# Patient Record
Sex: Female | Born: 1945 | Hispanic: No | Marital: Married | Smoking: Never smoker
Health system: Southern US, Community
[De-identification: ages and names within clinical notes are randomized; demographics above are authoritative.]

## PROBLEM LIST (undated history)

## (undated) ENCOUNTER — Ambulatory Visit: Payer: MEDICARE | Attending: Clinical Neuropsychologist | Primary: Clinical Neuropsychologist

## (undated) ENCOUNTER — Encounter: Attending: Neurology | Primary: Neurology

## (undated) ENCOUNTER — Encounter

## (undated) ENCOUNTER — Ambulatory Visit

## (undated) ENCOUNTER — Ambulatory Visit: Payer: MEDICARE

## (undated) ENCOUNTER — Ambulatory Visit: Payer: MEDICARE | Attending: Adult Health | Primary: Adult Health

## (undated) ENCOUNTER — Encounter: Attending: Adult Health | Primary: Adult Health

## (undated) ENCOUNTER — Ambulatory Visit: Attending: Internal Medicine | Primary: Internal Medicine

## (undated) ENCOUNTER — Encounter: Attending: Family Medicine | Primary: Family Medicine

## (undated) ENCOUNTER — Ambulatory Visit
Payer: MEDICARE | Attending: Student in an Organized Health Care Education/Training Program | Primary: Student in an Organized Health Care Education/Training Program

## (undated) ENCOUNTER — Encounter: Attending: Physician Assistant | Primary: Physician Assistant

## (undated) ENCOUNTER — Encounter
Attending: Student in an Organized Health Care Education/Training Program | Primary: Student in an Organized Health Care Education/Training Program

## (undated) DIAGNOSIS — D751 Secondary polycythemia: Secondary | ICD-10-CM

## (undated) DIAGNOSIS — D45 Polycythemia vera: Secondary | ICD-10-CM

## (undated) DIAGNOSIS — D75839 Thrombocytosis, unspecified: Secondary | ICD-10-CM

## (undated) DIAGNOSIS — I8390 Asymptomatic varicose veins of unspecified lower extremity: Secondary | ICD-10-CM

## (undated) DIAGNOSIS — Z23 Encounter for immunization: Secondary | ICD-10-CM

## (undated) DIAGNOSIS — F418 Other specified anxiety disorders: Secondary | ICD-10-CM

## (undated) DIAGNOSIS — R251 Tremor, unspecified: Secondary | ICD-10-CM

## (undated) DIAGNOSIS — K635 Polyp of colon: Secondary | ICD-10-CM

## (undated) DIAGNOSIS — R634 Abnormal weight loss: Secondary | ICD-10-CM

## (undated) DIAGNOSIS — B373 Candidiasis of vulva and vagina: Secondary | ICD-10-CM

## (undated) DIAGNOSIS — I10 Essential (primary) hypertension: Secondary | ICD-10-CM

## (undated) DIAGNOSIS — B3731 Acute candidiasis of vulva and vagina: Secondary | ICD-10-CM

## (undated) DIAGNOSIS — D473 Essential (hemorrhagic) thrombocythemia: Secondary | ICD-10-CM

## (undated) DIAGNOSIS — G4700 Insomnia, unspecified: Secondary | ICD-10-CM

## (undated) HISTORY — DX: Essential (primary) hypertension: I10

## (undated) HISTORY — DX: Encounter for immunization: Z23

## (undated) HISTORY — DX: Acute candidiasis of vulva and vagina: B37.31

## (undated) HISTORY — DX: Thrombocytosis, unspecified: D75.839

## (undated) HISTORY — DX: Asymptomatic varicose veins of unspecified lower extremity: I83.90

## (undated) HISTORY — DX: Essential (hemorrhagic) thrombocythemia: D47.3

## (undated) HISTORY — DX: Other specified anxiety disorders: F41.8

## (undated) HISTORY — DX: Candidiasis of vulva and vagina: B37.3

## (undated) HISTORY — DX: Abnormal weight loss: R63.4

## (undated) HISTORY — DX: Insomnia, unspecified: G47.00

## (undated) HISTORY — DX: Polyp of colon: K63.5

## (undated) HISTORY — DX: Secondary polycythemia: D75.1

## (undated) HISTORY — DX: Tremor, unspecified: R25.1

## (undated) HISTORY — PX: NO PAST SURGERIES: SHX2092

## (undated) HISTORY — DX: Polycythemia vera: D45

---

## 1997-07-23 ENCOUNTER — Other Ambulatory Visit: Admission: RE | Admit: 1997-07-23 | Discharge: 1997-07-23 | Payer: Self-pay | Admitting: Family Medicine

## 1999-05-04 ENCOUNTER — Other Ambulatory Visit: Admission: RE | Admit: 1999-05-04 | Discharge: 1999-05-04 | Payer: Self-pay | Admitting: Family Medicine

## 2003-04-09 ENCOUNTER — Other Ambulatory Visit: Admission: RE | Admit: 2003-04-09 | Discharge: 2003-04-09 | Payer: Self-pay | Admitting: Family Medicine

## 2003-05-25 DIAGNOSIS — K635 Polyp of colon: Secondary | ICD-10-CM

## 2003-05-25 HISTORY — DX: Polyp of colon: K63.5

## 2003-06-02 ENCOUNTER — Ambulatory Visit (HOSPITAL_COMMUNITY): Admission: RE | Admit: 2003-06-02 | Discharge: 2003-06-02 | Payer: Self-pay | Admitting: *Deleted

## 2003-06-02 ENCOUNTER — Encounter (INDEPENDENT_AMBULATORY_CARE_PROVIDER_SITE_OTHER): Payer: Self-pay | Admitting: Specialist

## 2005-01-01 ENCOUNTER — Other Ambulatory Visit: Admission: RE | Admit: 2005-01-01 | Discharge: 2005-01-01 | Payer: Self-pay | Admitting: Family Medicine

## 2009-10-27 ENCOUNTER — Ambulatory Visit: Payer: Self-pay | Admitting: Psychiatry

## 2009-11-22 ENCOUNTER — Emergency Department (HOSPITAL_COMMUNITY): Admission: EM | Admit: 2009-11-22 | Discharge: 2009-11-22 | Payer: Self-pay | Admitting: Emergency Medicine

## 2009-11-22 ENCOUNTER — Ambulatory Visit (HOSPITAL_COMMUNITY)
Admission: RE | Admit: 2009-11-22 | Discharge: 2009-11-22 | Payer: Self-pay | Source: Home / Self Care | Admitting: Psychiatry

## 2009-11-23 ENCOUNTER — Other Ambulatory Visit (HOSPITAL_COMMUNITY): Admission: RE | Admit: 2009-11-23 | Discharge: 2009-11-25 | Payer: Self-pay | Admitting: Psychiatry

## 2009-11-25 ENCOUNTER — Ambulatory Visit (HOSPITAL_COMMUNITY): Payer: Self-pay | Admitting: Psychiatry

## 2009-12-09 ENCOUNTER — Ambulatory Visit (HOSPITAL_COMMUNITY): Payer: Self-pay | Admitting: Psychiatry

## 2009-12-12 ENCOUNTER — Ambulatory Visit (HOSPITAL_COMMUNITY): Payer: Self-pay | Admitting: Psychiatry

## 2010-03-03 ENCOUNTER — Ambulatory Visit (HOSPITAL_COMMUNITY): Payer: Self-pay | Admitting: Psychiatry

## 2010-06-02 ENCOUNTER — Encounter (HOSPITAL_COMMUNITY): Payer: Self-pay | Admitting: Psychiatry

## 2010-06-09 LAB — BASIC METABOLIC PANEL
BUN: 22 mg/dL (ref 6–23)
CO2: 28 mEq/L (ref 19–32)
Calcium: 9.5 mg/dL (ref 8.4–10.5)
Chloride: 102 mEq/L (ref 96–112)
Creatinine, Ser: 0.97 mg/dL (ref 0.4–1.2)
GFR calc Af Amer: >60 mL/min (ref >60)
GFR calc non Af Amer: 58 mL/min — ABNORMAL LOW (ref >60)
Glucose, Bld: 88 mg/dL (ref 70–99)
Potassium: 3.8 mEq/L (ref 3.5–5.1)
Sodium: 137 mEq/L (ref 135–145)

## 2010-06-09 LAB — DIFFERENTIAL
Basophils Absolute: 0 10*3/uL (ref 0.0–0.1)
Basophils Relative: 0 % (ref 0–1)
Eosinophils Absolute: 0.1 10*3/uL (ref 0.0–0.7)
Eosinophils Relative: 1 % (ref 0–5)
Lymphocytes Relative: 20 % (ref 12–46)
Lymphs Abs: 1.7 10*3/uL (ref 0.7–4.0)
Monocytes Absolute: 0.8 10*3/uL (ref 0.1–1.0)
Monocytes Relative: 9 % (ref 3–12)
Neutro Abs: 5.8 10*3/uL (ref 1.7–7.7)
Neutrophils Relative %: 69 % (ref 43–77)

## 2010-06-09 LAB — URINALYSIS, ROUTINE W REFLEX MICROSCOPIC
Glucose, UA: NEGATIVE mg/dL
Hgb urine dipstick: NEGATIVE
Nitrite: NEGATIVE
Protein, ur: NEGATIVE mg/dL
Specific Gravity, Urine: 1.02 (ref 1.005–1.030)
Urobilinogen, UA: 1 mg/dL (ref 0.0–1.0)
pH: 5.5 (ref 5.0–8.0)

## 2010-06-09 LAB — CBC
HCT: 45 % (ref 36.0–46.0)
Hemoglobin: 15.2 g/dL — ABNORMAL HIGH (ref 12.0–15.0)
MCH: 28 pg (ref 26.0–34.0)
MCHC: 33.8 g/dL (ref 30.0–36.0)
MCV: 83 fL (ref 78.0–100.0)
Platelets: 696 10*3/uL — ABNORMAL HIGH (ref 150–400)
RBC: 5.42 MIL/uL — ABNORMAL HIGH (ref 3.87–5.11)
RDW: 16.1 % — ABNORMAL HIGH (ref 11.5–15.5)
WBC: 8.5 10*3/uL (ref 4.0–10.5)

## 2010-06-09 LAB — URINE MICROSCOPIC-ADD ON

## 2010-06-16 ENCOUNTER — Encounter (HOSPITAL_COMMUNITY): Payer: Medicare Other | Admitting: Psychiatry

## 2010-06-16 DIAGNOSIS — F331 Major depressive disorder, recurrent, moderate: Secondary | ICD-10-CM

## 2010-09-15 ENCOUNTER — Encounter (HOSPITAL_COMMUNITY): Payer: Medicare Other | Admitting: Psychiatry

## 2010-10-20 ENCOUNTER — Encounter (HOSPITAL_COMMUNITY): Payer: Medicare Other | Admitting: Psychiatry

## 2010-10-25 ENCOUNTER — Encounter (INDEPENDENT_AMBULATORY_CARE_PROVIDER_SITE_OTHER): Payer: Medicare Other | Admitting: Psychiatry

## 2010-10-25 DIAGNOSIS — F331 Major depressive disorder, recurrent, moderate: Secondary | ICD-10-CM

## 2010-12-06 ENCOUNTER — Encounter (HOSPITAL_COMMUNITY): Payer: Medicare Other | Admitting: Psychiatry

## 2011-06-05 ENCOUNTER — Other Ambulatory Visit (HOSPITAL_COMMUNITY)
Admission: RE | Admit: 2011-06-05 | Discharge: 2011-06-05 | Disposition: A | Discharge status: Home / Self Care | Payer: Medicare Other | Source: Ambulatory Visit | Attending: Obstetrics and Gynecology | Admitting: Obstetrics and Gynecology

## 2011-06-05 ENCOUNTER — Inpatient Hospital Stay (HOSPITAL_COMMUNITY): Admit: 2011-06-05 | Payer: Self-pay

## 2011-06-05 DIAGNOSIS — Z124 Encounter for screening for malignant neoplasm of cervix: Secondary | ICD-10-CM | POA: Insufficient documentation

## 2011-07-16 ENCOUNTER — Telehealth: Payer: Self-pay | Admitting: *Deleted

## 2011-07-16 ENCOUNTER — Other Ambulatory Visit: Payer: Self-pay | Admitting: Oncology

## 2011-07-16 NOTE — Telephone Encounter (Signed)
Called pt's daughter, Synetta Fail,  With pt's date and times of new pt appt on 4/24, including lab and possible phlebotomy.  She verbalized understanding.

## 2011-07-17 ENCOUNTER — Encounter: Payer: Self-pay | Admitting: Oncology

## 2011-07-17 ENCOUNTER — Telehealth: Payer: Self-pay | Admitting: Oncology

## 2011-07-17 DIAGNOSIS — D75839 Thrombocytosis, unspecified: Secondary | ICD-10-CM | POA: Insufficient documentation

## 2011-07-17 DIAGNOSIS — D473 Essential (hemorrhagic) thrombocythemia: Secondary | ICD-10-CM | POA: Insufficient documentation

## 2011-07-17 DIAGNOSIS — D751 Secondary polycythemia: Secondary | ICD-10-CM | POA: Insufficient documentation

## 2011-07-17 DIAGNOSIS — F418 Other specified anxiety disorders: Secondary | ICD-10-CM | POA: Insufficient documentation

## 2011-07-17 NOTE — Telephone Encounter (Signed)
Referred by Dr. Everlene Other Dx- PCV/Thrombocytosis

## 2011-07-17 NOTE — Patient Instructions (Signed)
ISSUES:  Polycythemia (elevated red blood cell/hemoglobin) and thrombocytosis (elevated platelet count)  - Possibilities:  Reactive vs polycythemia vera (PV) and essential thrombocytosis (ET).   Rule out much rarer diagnosis CML.  - Work up:  Blood test to see if you have PV or ET.  - Treatment:  * Start low dose aspirin 80mg  once daily to decrease risk of clot.  * Phlebotomy to bring down Hct to less than 45 to decrease risk of clot.  * If PV and ET is confirm, will start Hydrea to normalize the blood count to decrease risk of clot.

## 2011-07-18 ENCOUNTER — Other Ambulatory Visit: Payer: Medicare Other

## 2011-07-18 ENCOUNTER — Ambulatory Visit: Payer: Medicare Other

## 2011-07-18 ENCOUNTER — Telehealth: Payer: Self-pay | Admitting: Oncology

## 2011-07-18 ENCOUNTER — Other Ambulatory Visit: Payer: Self-pay | Admitting: Oncology

## 2011-07-18 ENCOUNTER — Other Ambulatory Visit: Payer: Medicare Other | Admitting: Lab

## 2011-07-18 ENCOUNTER — Ambulatory Visit (HOSPITAL_BASED_OUTPATIENT_CLINIC_OR_DEPARTMENT_OTHER): Payer: Medicare Other | Admitting: Oncology

## 2011-07-18 ENCOUNTER — Encounter: Payer: Self-pay | Admitting: Oncology

## 2011-07-18 ENCOUNTER — Ambulatory Visit: Payer: Medicare Other | Admitting: Oncology

## 2011-07-18 VITALS — BP 144/95 | HR 79 | Temp 97.1°F | Ht 62.0 in | Wt 119.8 lb

## 2011-07-18 DIAGNOSIS — F418 Other specified anxiety disorders: Secondary | ICD-10-CM

## 2011-07-18 DIAGNOSIS — D75839 Thrombocytosis, unspecified: Secondary | ICD-10-CM

## 2011-07-18 DIAGNOSIS — D473 Essential (hemorrhagic) thrombocythemia: Secondary | ICD-10-CM

## 2011-07-18 DIAGNOSIS — R197 Diarrhea, unspecified: Secondary | ICD-10-CM

## 2011-07-18 DIAGNOSIS — D751 Secondary polycythemia: Secondary | ICD-10-CM

## 2011-07-18 DIAGNOSIS — F341 Dysthymic disorder: Secondary | ICD-10-CM

## 2011-07-18 DIAGNOSIS — D45 Polycythemia vera: Secondary | ICD-10-CM

## 2011-07-18 LAB — CBC WITH DIFFERENTIAL/PLATELET
BASO%: 0.5 % (ref 0.0–2.0)
Basophils Absolute: 0 10*3/uL (ref 0.0–0.1)
EOS%: 1.9 % (ref 0.0–7.0)
Eosinophils Absolute: 0.2 10*3/uL (ref 0.0–0.5)
HCT: 51.7 % — ABNORMAL HIGH (ref 34.8–46.6)
HGB: 16.4 g/dL — ABNORMAL HIGH (ref 11.6–15.9)
LYMPH%: 11.2 % — ABNORMAL LOW (ref 14.0–49.7)
MCH: 25.4 pg (ref 25.1–34.0)
MCHC: 31.7 g/dL (ref 31.5–36.0)
MCV: 80.1 fL (ref 79.5–101.0)
MONO#: 0.7 10*3/uL (ref 0.1–0.9)
MONO%: 7.5 % (ref 0.0–14.0)
NEUT#: 7.5 10*3/uL — ABNORMAL HIGH (ref 1.5–6.5)
NEUT%: 78.9 % — ABNORMAL HIGH (ref 38.4–76.8)
Platelets: 569 10*3/uL — ABNORMAL HIGH (ref 145–400)
RBC: 6.45 10*6/uL — ABNORMAL HIGH (ref 3.70–5.45)
RDW: 16.4 % — ABNORMAL HIGH (ref 11.2–14.5)
WBC: 9.5 10*3/uL (ref 3.9–10.3)
lymph#: 1.1 10*3/uL (ref 0.9–3.3)
nRBC: 0 % (ref 0–0)

## 2011-07-18 NOTE — Progress Notes (Signed)
Patient came in today as a new patient, Patients daughter was assisting her.  patient was a little sad because her husband had just passed away in 2022/07/20 from cancer then to find out that she needed to come here as a patient as well. I went on to explain to the patient about the financial meeting for today. Patient at this time was ok and her daughter stated that if anything changed they would contact me. Patient does have insurance. Also  Patient will be picking up [employer] again they stopped it after Husbands passing.

## 2011-07-18 NOTE — Progress Notes (Signed)
[facility]  Telephone:([phone] [phone]     INITIAL HEMATOLOGY CONSULTATION    Referral MD:    Dr. Tracey Harries, M.D.   Reason for Referral: polycythemia and thrombocytosis.     HPI:  Carolyn Figueroa is a 66 year-old [place] American woman with PMH of HTN, depression/anxiety.  Her husband was my patient for about 2 years until he passes away two months ago.  Since then, she has been quite depressed.  Over the past two months, she has developed refractory vaginal dryness, pruritus, clear discharge without frank hematuria, dysuria, pyuria.  She was given different courses of antibiotics without improvement of her symptoms.  She was referred to Dr. Lindley Magnus from urology thinking that she may have lower urinary tract symptoms.  She had a CT abdomen that was negative.  However, she had abnormal CBC with polycythemia and thrombocytosis.  Upon reviewing her available CBC, these were present as far back as 11/22/2009 when WBC 5.4; Hgb 15.2; Plt 696K.  Seeing this abnormal CBC, she was kindly referred to the [facility] for evaluation.   Carolyn Figueroa presented to the clinic as a patient for the first time today.  She has chronic fatigue ever since her husband was diagnosed with metastatic pancreas cancer two years ago.  Since his death, she has felt more depressed.  She does not feel like doing anything.  She has low appetite and has had further weight loss.  Despite multiple courses of antibiotics, she still has vaginal dryness, pruritus, clear discharge.  She has developed for the past two weeks watery diarrhea about 3-4x/day without melena, hematochezia, abdominal pain, fever.  She denies skin rash, erythromelalgia, palpable node swelling, drenching night sweat, neuropathy, thigh/calf swelling/edema, chest pain, SOB, DOE, lower back pain, bowel/bladder incontinence.     Past Medical History  Diagnosis Date  . Depression with anxiety   . Polycythemia   .  Thrombocytosis   . Vulvar candidiasis   . Insomnia   . Varicose vein   . Colon polyp 05/2003  . HTN (hypertension)   . Vitamin d deficiency   :    History reviewed. No pertinent past surgical history.:   CURRENT MEDS: Current Outpatient Prescriptions  Medication Sig Dispense Refill  . conjugated estrogens (PREMARIN) vaginal cream Place 0.5 g vaginally 3 (three) times a week.      Marland Kitchen lisinopril (PRINIVIL,ZESTRIL) 20 MG tablet Take 20 mg by mouth daily.      Marland Kitchen LORazepam (ATIVAN) 0.5 MG tablet Take 0.5 mg by mouth 2 (two) times daily.          No Known Allergies:  Family History  Problem Relation Age of Onset  . Heart attack Father   . Clotting disorder Father   . Cancer Paternal Uncle     unknown subtype  :  History   Social History  . Marital Status: Married    Spouse Name: N/A    Number of Children: 2  . Years of Education: N/A   Occupational History  .      Housewife   Social History Main Topics  . Smoking status: Never Smoker   . Smokeless tobacco: Never Used  . Alcohol Use: No  . Drug Use: No  . Sexually Active: No   Other Topics Concern  . Not on file   Social History Narrative  . No narrative on file  :  REVIEW OF SYSTEM:  The rest of the 14-point review of sytem was negative.  Exam: ECOG 1  General:  well-nourished woman in no acute distress.  Eyes:  no scleral icterus.  ENT:  There were no oropharyngeal lesions.  Neck was without thyromegaly.  Lymphatics:  Negative cervical, supraclavicular or axillary adenopathy.  Respiratory: lungs were clear bilaterally without wheezing or crackles.  Cardiovascular:  Regular rate and rhythm, S1/S2, without murmur, rub or gallop.  There was no pedal edema.  GI:  abdomen was soft, flat, nontender, nondistended, without organomegaly.  Muscoloskeletal:  no spinal tenderness of palpation of vertebral spine.  Skin exam was without echymosis, petichae. There was no clubbing or cyanosis.  Neuro exam was nonfocal.   Patient was able to get on and off exam table without assistance.  Gait was normal.  Patient was alerted and oriented.  Attention was good.   Language was appropriate.  Mood was depressed.  Speech was not pressured.  Thought content was not tangential.    LABS:  Lab Results  Component Value Date   WBC 9.5 07/18/2011   HGB 16.4* 07/18/2011   HCT 51.7* 07/18/2011   PLT 569* 07/18/2011   GLUCOSE 88 11/22/2009   NA 137 11/22/2009   K 3.8 11/22/2009   CL 102 11/22/2009   CREATININE 0.97 11/22/2009   BUN 22 11/22/2009   CO2 28 11/22/2009    Blood smear review:   I personally reviewed the patient's peripheral blood smear today.  There was isocytosis.  There was no peripheral blast.  There was no schistocytosis, spherocytosis, target cell, rouleaux formation, tear drop cell.  There was no giant platelets or platelet clumps.      ASSESSMENT AND PLAN:   1.  Polycythemia and thrombocytosis  - Possibilities:  Reactive vs polycythemia vera (PV) and essential thrombocytosis (ET) vs early stage myelofibrosis.   Rule out much rarer diagnosis CML.  We discussed the low risk of transformation from PV/ET to AML.  Without splenomegaly, it is less likely to be advanced myelofibrosis.  - Work up: I sent for JAK-2 mutation to rule out for PV or ET.  I also sent for BCR/ABL to rule out CML.  If these tests are negative and she continues to have worsened polycythemia and thrombocytosis, I may consider bone marrow biopsy in the future.  - Treatment:  * Start low dose aspirin 80mg  once daily to decrease risk of clot.  * Phlebotomy to bring down Hct to less than 45 to decrease risk of clot.  She did not want to do this today since she already had 6 vials of blood sample drawn.   * If JAK-2 mutation positive, then she has PV/ET.  However, if JAK-2 mutation is negative, we can rule out PV, but ET is still a possibility (since JAK-2 is only positive in about 50% of ET).   * If she is confirms to have PV or ET, when we will  need to discuss in more detail about the role of Hydrea.  We touched on it briefly today its role to decrease the risk of thrombosis.     2.  Depression:  Normal grieving with husband's recent death.  However, if within the next few weeks and her depression persists, she may benefit from antidepressant.  She did not tolerate Paxil in the past.  She is only taking Ativan which may not be sufficient.   3.  Vaginal dryness, pruritus, clear discharge: unclear etiology; no improvement with antibiotics.  Follow up with Dr. Neva Seat this week for further evaluation.   4.  Diarrhea:  Most likely due to antibiotics.  However, we'll also rule out Cdif.   5.  Follow up:  If negative testing for JAK-2 and BCR/ABL, we'll see her in 3 months along with q2wk CBC and phlebotomy for Hct >45%.    Thank you for this referral.   The length of time of the face-to-face encounter was 45 minutes. More than 50% of time was spent counseling and coordination of care.

## 2011-07-18 NOTE — Telephone Encounter (Signed)
appt made and printed for pt aom °

## 2011-07-19 ENCOUNTER — Other Ambulatory Visit: Payer: Self-pay | Admitting: Oncology

## 2011-07-19 ENCOUNTER — Telehealth: Payer: Self-pay | Admitting: *Deleted

## 2011-07-19 DIAGNOSIS — D751 Secondary polycythemia: Secondary | ICD-10-CM

## 2011-07-19 LAB — COMPREHENSIVE METABOLIC PANEL
ALT: 8 U/L (ref 0–35)
AST: 16 U/L (ref 0–37)
Albumin: 4.1 g/dL (ref 3.5–5.2)
Alkaline Phosphatase: 53 U/L (ref 39–117)
BUN: 22 mg/dL (ref 6–23)
CO2: 28 mEq/L (ref 19–32)
Calcium: 9.1 mg/dL (ref 8.4–10.5)
Chloride: 103 mEq/L (ref 96–112)
Creatinine, Ser: 1.07 mg/dL (ref 0.50–1.10)
Glucose, Bld: 87 mg/dL (ref 70–99)
Potassium: 4.3 mEq/L (ref 3.5–5.3)
Sodium: 141 mEq/L (ref 135–145)
Total Bilirubin: 1.2 mg/dL (ref 0.3–1.2)
Total Protein: 6.5 g/dL (ref 6.0–8.3)

## 2011-07-19 LAB — ERYTHROPOIETIN: Erythropoietin: <1 m[IU]/mL — ABNORMAL LOW (ref 2.6–34.0)

## 2011-07-19 LAB — IRON AND TIBC
%SAT: 11 % — ABNORMAL LOW (ref 20–55)
Iron: 38 ug/dL — ABNORMAL LOW (ref 42–145)
TIBC: 340 ug/dL (ref 250–470)
UIBC: 302 ug/dL (ref 125–400)

## 2011-07-19 LAB — FERRITIN: Ferritin: 17 ng/mL (ref 10–291)

## 2011-07-19 NOTE — Telephone Encounter (Signed)
Call from pt's daughter to report that pt's Uncle had Leukemia, but they do not know which type.   Notified Dr. Gaylyn Rong. Also faxed yesterday's progress note to Dr. Neva Seat, GYN [phone]) for pt's visit this afternoon.

## 2011-07-23 ENCOUNTER — Other Ambulatory Visit: Payer: Medicare Other

## 2011-07-23 ENCOUNTER — Ambulatory Visit: Payer: Medicare Other

## 2011-07-23 ENCOUNTER — Ambulatory Visit: Payer: Medicare Other | Admitting: Oncology

## 2011-07-23 LAB — BCR/ABL (LIO MMD)

## 2011-07-23 LAB — JAK2 GENOTYPR

## 2011-07-25 ENCOUNTER — Other Ambulatory Visit: Payer: Self-pay | Admitting: *Deleted

## 2011-07-27 ENCOUNTER — Telehealth: Payer: Self-pay | Admitting: Oncology

## 2011-07-27 ENCOUNTER — Ambulatory Visit: Payer: Medicare Other

## 2011-07-27 ENCOUNTER — Encounter: Payer: Self-pay | Admitting: *Deleted

## 2011-07-27 ENCOUNTER — Other Ambulatory Visit: Payer: Self-pay | Admitting: Oncology

## 2011-07-27 ENCOUNTER — Telehealth: Payer: Self-pay | Admitting: *Deleted

## 2011-07-27 DIAGNOSIS — D473 Essential (hemorrhagic) thrombocythemia: Secondary | ICD-10-CM

## 2011-07-27 DIAGNOSIS — F418 Other specified anxiety disorders: Secondary | ICD-10-CM

## 2011-07-27 DIAGNOSIS — D751 Secondary polycythemia: Secondary | ICD-10-CM

## 2011-07-27 DIAGNOSIS — D75839 Thrombocytosis, unspecified: Secondary | ICD-10-CM

## 2011-07-27 DIAGNOSIS — R197 Diarrhea, unspecified: Secondary | ICD-10-CM

## 2011-07-27 NOTE — Telephone Encounter (Signed)
called pts dauhgter informed her that we added md appt for 05/08 and to arrive at 2:30pm

## 2011-07-27 NOTE — Telephone Encounter (Signed)
Received verbal notification that pt's stool specimen is positive for C diff.   Notified dr. Gaylyn Rong and he instructs to notify pt's PCP.  I called Dr. Bonney Leitz office and they are closed for lunch at this time. Will call again after lunch.  Called Dr. Bonney Leitz office and spoke w/ Patice. Informed her pt's stool positive for C-diff and she states will inform Dr. Everlene Other.

## 2011-07-27 NOTE — Progress Notes (Signed)
C-diff results faxed to Dr. Bonney Leitz office at [phone].

## 2011-07-30 ENCOUNTER — Other Ambulatory Visit: Payer: Self-pay | Admitting: Oncology

## 2011-07-30 LAB — CLOSTRIDIUM DIFFICILE BY PCR: Toxigenic C. Difficile by PCR: POSITIVE — CR

## 2011-07-31 NOTE — Progress Notes (Signed)
Daughter called to cancel appointment for tomorrow 08/01/11, states that patient has diarrhea and is diagnosed with C-diff, and antibiotics; states that patient has has to continuously go to bathroom and is very weak; will call back to reschedule appointment.

## 2011-08-01 ENCOUNTER — Ambulatory Visit: Payer: Medicare Other | Admitting: Oncology

## 2011-08-01 ENCOUNTER — Other Ambulatory Visit: Payer: Medicare Other | Admitting: Lab

## 2011-08-03 ENCOUNTER — Telehealth: Payer: Self-pay | Admitting: Oncology

## 2011-08-03 NOTE — Telephone Encounter (Signed)
called pts daughter lmovm for appt d/t on 05/22

## 2011-08-08 ENCOUNTER — Telehealth: Payer: Self-pay | Admitting: *Deleted

## 2011-08-08 NOTE — Telephone Encounter (Signed)
Called Synetta Fail back and informed that Dr. Gaylyn Rong said he is going to recommend pt start Hydrea on next office visit.  So pt will likely not have phlebotomy done and IVF cannot be done here based on pt's diagnosis.  Instructed to keep office visit on 5/22 as scheduled and to f/u w/ PCP for question/concern of dehydration,  Decreased appetite and depression.  Synetta Fail verbalized understanding and states pt has appt w/ Dr. Everlene Other tomorrow.  She asks for labs (jak2 and bcr/able reports) to be faxed to Dr. Everlene Other.   Faxed labs to [phone].

## 2011-08-08 NOTE — Telephone Encounter (Signed)
VM left by pt's daughter asking if pt can get Phlebotomy this week along with IVFs?  She feels pt is not eating/drinking well, just recovering from C-diff and asks if Phlebotomy can be done since it was put on hold last week but also worries about pt getting more Dehydrated at same time.

## 2011-08-09 ENCOUNTER — Telehealth: Payer: Self-pay | Admitting: *Deleted

## 2011-08-09 NOTE — Telephone Encounter (Signed)
Dau left VM informing that pt saw her PCP yesterday and was started on Celexa and her dose of Lorazepam was adjusted to one in am and 2 at HS.  She states pt c/o sweating at night, along w/ itching after showering and stiff neck w/ blurred vision. Pt has appt to see Dr. Gaylyn Rong on 5/22, in one week.

## 2011-08-10 ENCOUNTER — Encounter: Payer: Self-pay | Admitting: Dietician

## 2011-08-10 NOTE — Progress Notes (Signed)
Brief Out-patient Oncology Nutrition Note  Reason: Positive Nutrition Risk Screen for unintentional weight loss and decreased appetite.  Carolyn Figueroa is a 66 year old female patient of Dr. Gaylyn Rong, diagnosed with polycythemia and thrombocystosis. Attempted to contact [name] via home telephone number for nutrition risk. Left patient voice mail with RD contact information.  RD available for nutrition needs.  Iven Finn [facility] [phone]

## 2011-08-13 ENCOUNTER — Telehealth: Payer: Self-pay | Admitting: *Deleted

## 2011-08-13 NOTE — Telephone Encounter (Signed)
Call from nurse, Asher Muir, for [facility] Anis PA-C,  Requests labs be added on to labs her on 08/15/11.  She requests Hgb A1C, CMET and Thyroid profile. Orders faxed to [place] and I gave copy to lab tech Chip Boer for lab on 5/22.  Results to be faxed to Elpidio Anis at fax [phone].

## 2011-08-15 ENCOUNTER — Other Ambulatory Visit (HOSPITAL_BASED_OUTPATIENT_CLINIC_OR_DEPARTMENT_OTHER): Payer: Medicare Other | Admitting: Lab

## 2011-08-15 ENCOUNTER — Encounter: Payer: Self-pay | Admitting: Oncology

## 2011-08-15 ENCOUNTER — Telehealth: Payer: Self-pay | Admitting: Oncology

## 2011-08-15 ENCOUNTER — Ambulatory Visit (HOSPITAL_BASED_OUTPATIENT_CLINIC_OR_DEPARTMENT_OTHER): Payer: Medicare Other | Admitting: Oncology

## 2011-08-15 ENCOUNTER — Other Ambulatory Visit: Payer: Medicare Other | Admitting: Lab

## 2011-08-15 VITALS — BP 110/76 | HR 90 | Temp 97.2°F | Ht 62.0 in | Wt 112.7 lb

## 2011-08-15 DIAGNOSIS — D75839 Thrombocytosis, unspecified: Secondary | ICD-10-CM

## 2011-08-15 DIAGNOSIS — D45 Polycythemia vera: Secondary | ICD-10-CM

## 2011-08-15 DIAGNOSIS — F329 Major depressive disorder, single episode, unspecified: Secondary | ICD-10-CM

## 2011-08-15 DIAGNOSIS — D751 Secondary polycythemia: Secondary | ICD-10-CM

## 2011-08-15 DIAGNOSIS — F418 Other specified anxiety disorders: Secondary | ICD-10-CM

## 2011-08-15 DIAGNOSIS — R197 Diarrhea, unspecified: Secondary | ICD-10-CM

## 2011-08-15 DIAGNOSIS — D473 Essential (hemorrhagic) thrombocythemia: Secondary | ICD-10-CM

## 2011-08-15 DIAGNOSIS — F3289 Other specified depressive episodes: Secondary | ICD-10-CM

## 2011-08-15 HISTORY — DX: Polycythemia vera: D45

## 2011-08-15 LAB — CBC WITH DIFFERENTIAL/PLATELET
BASO%: 0.8 % (ref 0.0–2.0)
Basophils Absolute: 0.1 10*3/uL (ref 0.0–0.1)
EOS%: 2 % (ref 0.0–7.0)
Eosinophils Absolute: 0.2 10*3/uL (ref 0.0–0.5)
HCT: 48.1 % — ABNORMAL HIGH (ref 34.8–46.6)
HGB: 15.4 g/dL (ref 11.6–15.9)
LYMPH%: 18.1 % (ref 14.0–49.7)
MCH: 25.4 pg (ref 25.1–34.0)
MCHC: 32.1 g/dL (ref 31.5–36.0)
MCV: 79.1 fL — ABNORMAL LOW (ref 79.5–101.0)
MONO#: 0.7 10*3/uL (ref 0.1–0.9)
MONO%: 8.7 % (ref 0.0–14.0)
NEUT#: 5.4 10*3/uL (ref 1.5–6.5)
NEUT%: 70.4 % (ref 38.4–76.8)
Platelets: 581 10*3/uL — ABNORMAL HIGH (ref 145–400)
RBC: 6.07 10*6/uL — ABNORMAL HIGH (ref 3.70–5.45)
RDW: 17.2 % — ABNORMAL HIGH (ref 11.2–14.5)
WBC: 7.7 10*3/uL (ref 3.9–10.3)
lymph#: 1.4 10*3/uL (ref 0.9–3.3)
nRBC: 0 % (ref 0–0)

## 2011-08-15 MED ORDER — HYDROXYUREA 500 MG PO CAPS: 500.0000 mg | ORAL_CAPSULE | Freq: Every day | ORAL | Status: DC

## 2011-08-15 MED ORDER — HYDROXYUREA 500 MG PO CAPS: 500.0000 mg | ORAL_CAPSULE | Freq: Two times a day (BID) | ORAL | Status: DC

## 2011-08-15 NOTE — Progress Notes (Signed)
[facility]  Telephone:(336) [phone] Fax:(336) [phone]   OFFICE PROGRESS NOTE   Cc:  Aura Dials, MD, MD  DIAGNOSIS:   JAK-2 mutation positive polycythemia vera (PV)  CURRENT THERAPY:  Here to start Hydrea 500mg  PO BID today 08/15/2011.   INTERVAL HISTORY: Carolyn Figueroa 66 y.o. female returns for regular follow up with her daughter to go over the result of work up from last visit.  She was found to have Cdif and was given a course of treatment with complete resolution of diarrhea, dysuria, vaginal dryness and discharge.  She has been very depressed ever since her husband's death from metastatic pancreatic cancer in March 2013.  She was recently started on Celexa last week.  She and her daughter have not noticed much significant improvement of her mood.  She has low appetite and continued to have weight loss.  She has no drive to do anything outside of the house.    Patient denies headache, visual changes, confusion, drenching night sweats, palpable lymph node swelling, mucositis, odynophagia, dysphagia, nausea vomiting, jaundice, chest pain, palpitation, shortness of breath, dyspnea on exertion, productive cough, gum bleeding, epistaxis, hematemesis, hemoptysis, abdominal pain, abdominal swelling, early satiety, melena, hematochezia, hematuria, skin rash, spontaneous bleeding, joint swelling, joint pain, heat or cold intolerance, bowel bladder incontinence, back pain, focal motor weakness, paresthesia.     Past Medical History  Diagnosis Date  . Depression with anxiety   . Polycythemia   . Thrombocytosis   . Vulvar candidiasis   . Insomnia   . Varicose vein   . Colon polyp 05/2003  . HTN (hypertension)   . Vitamin d deficiency   . 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  Current Outpatient Prescriptions  Medication Sig Dispense Refill  . citalopram (CELEXA) 10 MG tablet Take 10 mg by mouth daily.      Marland Kitchen lisinopril (PRINIVIL,ZESTRIL)  20 MG tablet Take 20 mg by mouth daily.      Marland Kitchen LORazepam (ATIVAN) 0.5 MG tablet Take 0.5 mg by mouth 2 (two) times daily.      Marland Kitchen conjugated estrogens (PREMARIN) vaginal cream Place 0.5 g vaginally 3 (three) times a week.      . hydroxyurea (HYDREA) 500 MG capsule Take 1 capsule (500 mg total) by mouth 2 (two) times daily. May take with food to minimize GI side effects.  60 capsule  5    ALLERGIES:   has no known allergies.  REVIEW OF SYSTEMS:  The rest of the 14-point review of system was negative.   Filed Vitals:   08/15/11 1342  BP: 110/76  Pulse: 90  Temp: 97.2 F (36.2 C)   Wt Readings from Last 3 Encounters:  08/15/11 112 lb 11.2 oz (51.12 kg)  07/18/11 119 lb 12.8 oz (54.341 kg)   ECOG Performance status: 1  PHYSICAL EXAMINATION:   General:  Thin-appearing woman, in no acute distress.  Eyes:  no scleral icterus.  ENT:  There were no oropharyngeal lesions.  Neck was without thyromegaly.  Lymphatics:  Negative cervical, supraclavicular or axillary adenopathy.  Respiratory: lungs were clear bilaterally without wheezing or crackles.  Cardiovascular:  Regular rate and rhythm, S1/S2, without murmur, rub or gallop.  There was no pedal edema.  GI:  abdomen was soft, flat, nontender, nondistended, without organomegaly.  Muscoloskeletal:  no spinal tenderness of palpation of vertebral spine.  Skin exam was without echymosis, petichae.  Neuro exam was nonfocal.  Patient was able to  get on and off exam table without assistance.  Gait was normal.  Patient was alerted and oriented.  Attention was good.   Language was appropriate.  Mood was depressed.  Speech was not pressured.  Thought content was not tangential.     LABORATORY/RADIOLOGY DATA:  Lab Results  Component Value Date   WBC 7.7 08/15/2011   HGB 15.4 08/15/2011   HCT 48.1* 08/15/2011   PLT 581* 08/15/2011   GLUCOSE 87 07/18/2011   ALKPHOS 53 07/18/2011   ALT 8 07/18/2011   AST 16 07/18/2011   NA 141 07/18/2011   K 4.3 07/18/2011   CL  103 07/18/2011   CREATININE 1.07 07/18/2011   BUN 22 07/18/2011   CO2 28 07/18/2011     ASSESSMENT AND PLAN:   1. Polycythemia and thrombocytosis:  Due to polycythemia vera (PV) - JAK-2 mutation positive confirming PV.  I explained to her that untreated PV is associated with increased risk of thrombosis.  Rarely, it can transform to myelofibrosis and AML.   - Treatment:  I strongly recommended her to start ASA 81mg  PO daily and Hydrea 500mg  PO BID.  These measures have been proven in randomized trial to decrease the risk of thrombosis.  We will check her lab every 2 weeks for the next 3 months to ensure that she does not develop pancytopenia from Hydrea.  We will see her back in about 3 months.  At that time, if her Hct is still >45%; we may consider therapeutic phlebotomy.   2. Depression: she is going into a major depression.  I agree with Celexa.  I advised patient to consider psychotherapy as she may not have improvement of her depression with just Celexa.  They will think this over and let us know if she needs referral.    3. Vaginal dryness, pruritus, clear discharge: unclear etiology; improved now.   4. Diarrhea: recently treated for Cdif.  It has thus resolved.     The length of time of the face-to-face encounter was 15 minutes. More than 50% of time was spent counseling and coordination of care.

## 2011-08-15 NOTE — Patient Instructions (Addendum)
A.  Diagnosis:  Polycythemia Vera (PV). - Elevated Hgb; risk of blood clots; low risk of transformation to myelofibrosis (scarring of the bone marrow) and acute leukemia.  - Low dose Aspirin 81mg  by mouth once daily to decrease risk of clot. - Hydrea starting out with 500mg  by mouth twice daily to normalize Hgb/Hct.  We need to check your blood count once every 2 weeks for the first few months to see your response.   After a few months of Hydrea, and your Hct is still >45; we may consider phlebotomy.

## 2011-08-15 NOTE — Telephone Encounter (Signed)
Gave pt appt calendar for June , July and August lab, phlebotomy and ML

## 2011-08-29 ENCOUNTER — Ambulatory Visit: Payer: Medicare Other

## 2011-08-29 ENCOUNTER — Other Ambulatory Visit (HOSPITAL_BASED_OUTPATIENT_CLINIC_OR_DEPARTMENT_OTHER): Payer: Medicare Other | Admitting: Lab

## 2011-08-29 ENCOUNTER — Telehealth: Payer: Self-pay | Admitting: *Deleted

## 2011-08-29 DIAGNOSIS — F418 Other specified anxiety disorders: Secondary | ICD-10-CM

## 2011-08-29 DIAGNOSIS — R197 Diarrhea, unspecified: Secondary | ICD-10-CM

## 2011-08-29 DIAGNOSIS — D751 Secondary polycythemia: Secondary | ICD-10-CM

## 2011-08-29 DIAGNOSIS — D75839 Thrombocytosis, unspecified: Secondary | ICD-10-CM

## 2011-08-29 DIAGNOSIS — D473 Essential (hemorrhagic) thrombocythemia: Secondary | ICD-10-CM

## 2011-08-29 DIAGNOSIS — D45 Polycythemia vera: Secondary | ICD-10-CM

## 2011-08-29 LAB — CBC WITH DIFFERENTIAL/PLATELET
BASO%: 0.8 % (ref 0.0–2.0)
Basophils Absolute: 0 10*3/uL (ref 0.0–0.1)
EOS%: 1.6 % (ref 0.0–7.0)
Eosinophils Absolute: 0.1 10*3/uL (ref 0.0–0.5)
HCT: 47.1 % — ABNORMAL HIGH (ref 34.8–46.6)
HGB: 15.2 g/dL (ref 11.6–15.9)
LYMPH%: 30 % (ref 14.0–49.7)
MCH: 26.4 pg (ref 25.1–34.0)
MCHC: 32.2 g/dL (ref 31.5–36.0)
MCV: 82 fL (ref 79.5–101.0)
MONO#: 0.3 10*3/uL (ref 0.1–0.9)
MONO%: 5.7 % (ref 0.0–14.0)
NEUT#: 3.2 10*3/uL (ref 1.5–6.5)
NEUT%: 61.9 % (ref 38.4–76.8)
Platelets: 511 10*3/uL — ABNORMAL HIGH (ref 145–400)
RBC: 5.75 10*6/uL — ABNORMAL HIGH (ref 3.70–5.45)
RDW: 17.5 % — ABNORMAL HIGH (ref 11.2–14.5)
WBC: 5.1 10*3/uL (ref 3.9–10.3)
lymph#: 1.5 10*3/uL (ref 0.9–3.3)
nRBC: 0 % (ref 0–0)

## 2011-08-29 NOTE — Progress Notes (Signed)
1450 Hct @ 47.1. Dr. Gaylyn Rong informed. No phlebotomy today, per Dr. Gaylyn Rong. Patient and daughter informed of lab counts and advised to continue taking Hydrea as directed per Dr. Gaylyn Rong. Patient verbalized understanding.

## 2011-08-29 NOTE — Telephone Encounter (Signed)
VM from pt's daughter asking if pt needs to wait on lab results today as she is also scheduled for possible phlebotomy.  Called her back and instructed to wait for results as Dr. Gaylyn Rong said in his office note pt may need phlebotomy if Hct still >45%.

## 2011-08-29 NOTE — Telephone Encounter (Signed)
Opened in error

## 2011-08-31 ENCOUNTER — Other Ambulatory Visit: Payer: Self-pay | Admitting: Physician Assistant

## 2011-09-04 ENCOUNTER — Other Ambulatory Visit: Payer: Self-pay | Admitting: *Deleted

## 2011-09-05 ENCOUNTER — Other Ambulatory Visit: Payer: Self-pay | Admitting: Physician Assistant

## 2011-09-05 ENCOUNTER — Other Ambulatory Visit: Payer: Self-pay | Admitting: Oncology

## 2011-09-05 DIAGNOSIS — D45 Polycythemia vera: Secondary | ICD-10-CM

## 2011-09-10 ENCOUNTER — Telehealth: Payer: Self-pay | Admitting: *Deleted

## 2011-09-10 NOTE — Telephone Encounter (Signed)
VM from pt's daughter asking if pt is supposed to be scheduled for phlebotomy on the 19th?  Her understanding was lab only.  Per Dr. Gaylyn Rong if pt's Hct is still over 45 pt should have phlebotomy, so we should keep it scheduled.  Dau mentioned a conflict w/ another appt so I called her back and left Vm informing of phlebotomy to stay scheduled if needed and to call back to r/s the day if needed.

## 2011-09-12 ENCOUNTER — Telehealth: Payer: Self-pay | Admitting: *Deleted

## 2011-09-12 ENCOUNTER — Other Ambulatory Visit (HOSPITAL_BASED_OUTPATIENT_CLINIC_OR_DEPARTMENT_OTHER): Payer: Medicare Other | Admitting: Lab

## 2011-09-12 DIAGNOSIS — D751 Secondary polycythemia: Secondary | ICD-10-CM

## 2011-09-12 DIAGNOSIS — F418 Other specified anxiety disorders: Secondary | ICD-10-CM

## 2011-09-12 DIAGNOSIS — R197 Diarrhea, unspecified: Secondary | ICD-10-CM

## 2011-09-12 DIAGNOSIS — D45 Polycythemia vera: Secondary | ICD-10-CM

## 2011-09-12 DIAGNOSIS — D473 Essential (hemorrhagic) thrombocythemia: Secondary | ICD-10-CM

## 2011-09-12 DIAGNOSIS — D75839 Thrombocytosis, unspecified: Secondary | ICD-10-CM

## 2011-09-12 LAB — COMPREHENSIVE METABOLIC PANEL
ALT: 16 U/L (ref 0–35)
AST: 20 U/L (ref 0–37)
Albumin: 3.8 g/dL (ref 3.5–5.2)
Alkaline Phosphatase: 37 U/L — ABNORMAL LOW (ref 39–117)
BUN: 28 mg/dL — ABNORMAL HIGH (ref 6–23)
CO2: 30 mEq/L (ref 19–32)
Calcium: 9.1 mg/dL (ref 8.4–10.5)
Chloride: 99 mEq/L (ref 96–112)
Creatinine, Ser: 1.06 mg/dL (ref 0.50–1.10)
Glucose, Bld: 130 mg/dL — ABNORMAL HIGH (ref 70–99)
Potassium: 3.9 mEq/L (ref 3.5–5.3)
Sodium: 138 mEq/L (ref 135–145)
Total Bilirubin: 1.7 mg/dL — ABNORMAL HIGH (ref 0.3–1.2)
Total Protein: 6.1 g/dL (ref 6.0–8.3)

## 2011-09-12 LAB — CBC WITH DIFFERENTIAL/PLATELET
BASO%: 0.9 % (ref 0.0–2.0)
Basophils Absolute: 0 10*3/uL (ref 0.0–0.1)
EOS%: 3.3 % (ref 0.0–7.0)
Eosinophils Absolute: 0.1 10*3/uL (ref 0.0–0.5)
HCT: 42.7 % (ref 34.8–46.6)
HGB: 14 g/dL (ref 11.6–15.9)
LYMPH%: 46.6 % (ref 14.0–49.7)
MCH: 27.7 pg (ref 25.1–34.0)
MCHC: 32.7 g/dL (ref 31.5–36.0)
MCV: 84.6 fL (ref 79.5–101.0)
MONO#: 0.3 10*3/uL (ref 0.1–0.9)
MONO%: 8.8 % (ref 0.0–14.0)
NEUT#: 1.2 10*3/uL — ABNORMAL LOW (ref 1.5–6.5)
NEUT%: 40.4 % (ref 38.4–76.8)
Platelets: 218 10*3/uL (ref 145–400)
RBC: 5.05 10*6/uL (ref 3.70–5.45)
RDW: 27.4 % — ABNORMAL HIGH (ref 11.2–14.5)
WBC: 3.1 10*3/uL — ABNORMAL LOW (ref 3.9–10.3)
lymph#: 1.4 10*3/uL (ref 0.9–3.3)

## 2011-09-12 MED ORDER — HYDROXYUREA 500 MG PO CAPS: ORAL_CAPSULE | ORAL | Status: DC

## 2011-09-12 NOTE — Telephone Encounter (Signed)
Spoke w/ pt and daughter in the lobby today.  Canceled phlebotomy per Dr. Gaylyn Rong.  Gave written instructions along w/ copy of labs to dau, Synetta Fail.  Instructed after clarifying w/ Dr. Gaylyn Rong for pt to take Hydrea twice daily on Mondays, Wednesdays and Fridays,  Then once daily on Tuesdays, Thursdays, Saturdays and Sundays.   Keep next lab only appt in 2 weeks.  Explained WBC a little low d/t Hydrea, but ok.  Synetta Fail verbalized understanding.  Synetta Fail called later in day to ask if low WBC will affect pt's healing from the cataract surgery.  Pt's next cataract surgery is on 7/02.  Informed Synetta Fail pt may be a slightly higher risk for infection, but should improve w/ lowering the dose of Hydrea.  Plan to recheck CBC prior to next surgery.  Order to scheduling to r/s lab from 7/03 to 7/01.   Faxed CBC results to pt's eye surgeon, Dr. Elmer Picker, at fax# [phone] per Anita's request.

## 2011-09-12 NOTE — Telephone Encounter (Signed)
Message copied by Wende Mott on Wed Sep 12, 2011  3:54 PM ------      Message from: HA, Raliegh Ip T      Created: Wed Sep 12, 2011  2:39 PM       Please call her daughter.  Hydrea is working.  Her plt is now normal as is her Hct.  She does not need phlebotomy.  Please cancel all pending phlebotomy appointments.  Her WBC is slightly lower due to Hydrea.  I recommend changing Hydrea to 500mg  PO ONCE daily on ODD days but 500mg  PO BID on even day and weekend.   Thanks.

## 2011-09-19 ENCOUNTER — Telehealth: Payer: Self-pay | Admitting: Oncology

## 2011-09-19 NOTE — Telephone Encounter (Signed)
called pts daughter lmovm to rtn call to r/s appt on 07/01

## 2011-09-24 ENCOUNTER — Other Ambulatory Visit: Payer: Medicare Other | Admitting: Lab

## 2011-09-24 DIAGNOSIS — D473 Essential (hemorrhagic) thrombocythemia: Secondary | ICD-10-CM

## 2011-09-24 DIAGNOSIS — D75839 Thrombocytosis, unspecified: Secondary | ICD-10-CM

## 2011-09-24 DIAGNOSIS — R197 Diarrhea, unspecified: Secondary | ICD-10-CM

## 2011-09-24 DIAGNOSIS — F418 Other specified anxiety disorders: Secondary | ICD-10-CM

## 2011-09-24 DIAGNOSIS — D751 Secondary polycythemia: Secondary | ICD-10-CM

## 2011-09-24 LAB — CBC WITH DIFFERENTIAL/PLATELET
BASO%: 0.6 % (ref 0.0–2.0)
Basophils Absolute: 0 10*3/uL (ref 0.0–0.1)
EOS%: 2 % (ref 0.0–7.0)
Eosinophils Absolute: 0.1 10*3/uL (ref 0.0–0.5)
HCT: 40 % (ref 34.8–46.6)
HGB: 12.9 g/dL (ref 11.6–15.9)
LYMPH%: 48.6 % (ref 14.0–49.7)
MCH: 28.5 pg (ref 25.1–34.0)
MCHC: 32.3 g/dL (ref 31.5–36.0)
MCV: 88.2 fL (ref 79.5–101.0)
MONO#: 0.4 10*3/uL (ref 0.1–0.9)
MONO%: 10.1 % (ref 0.0–14.0)
NEUT#: 1.5 10*3/uL (ref 1.5–6.5)
NEUT%: 38.7 % (ref 38.4–76.8)
Platelets: 231 10*3/uL (ref 145–400)
RBC: 4.53 10*6/uL (ref 3.70–5.45)
RDW: 29 % — ABNORMAL HIGH (ref 11.2–14.5)
WBC: 3.9 10*3/uL (ref 3.9–10.3)
lymph#: 1.9 10*3/uL (ref 0.9–3.3)

## 2011-09-26 ENCOUNTER — Other Ambulatory Visit: Payer: Medicare Other | Admitting: Lab

## 2011-10-10 ENCOUNTER — Other Ambulatory Visit (HOSPITAL_BASED_OUTPATIENT_CLINIC_OR_DEPARTMENT_OTHER): Payer: Medicare Other | Admitting: Lab

## 2011-10-10 DIAGNOSIS — F418 Other specified anxiety disorders: Secondary | ICD-10-CM

## 2011-10-10 DIAGNOSIS — D75839 Thrombocytosis, unspecified: Secondary | ICD-10-CM

## 2011-10-10 DIAGNOSIS — D751 Secondary polycythemia: Secondary | ICD-10-CM

## 2011-10-10 DIAGNOSIS — R197 Diarrhea, unspecified: Secondary | ICD-10-CM

## 2011-10-10 DIAGNOSIS — D45 Polycythemia vera: Secondary | ICD-10-CM

## 2011-10-10 DIAGNOSIS — D473 Essential (hemorrhagic) thrombocythemia: Secondary | ICD-10-CM

## 2011-10-10 LAB — CBC WITH DIFFERENTIAL/PLATELET
BASO%: 0.3 % (ref 0.0–2.0)
Basophils Absolute: 0 10*3/uL (ref 0.0–0.1)
EOS%: 0.7 % (ref 0.0–7.0)
Eosinophils Absolute: 0 10*3/uL (ref 0.0–0.5)
HCT: 37.8 % (ref 34.8–46.6)
HGB: 12.2 g/dL (ref 11.6–15.9)
LYMPH%: 44.8 % (ref 14.0–49.7)
MCH: 29.3 pg (ref 25.1–34.0)
MCHC: 32.3 g/dL (ref 31.5–36.0)
MCV: 90.8 fL (ref 79.5–101.0)
MONO#: 0.3 10*3/uL (ref 0.1–0.9)
MONO%: 7.2 % (ref 0.0–14.0)
NEUT#: 1.9 10*3/uL (ref 1.5–6.5)
NEUT%: 47 % (ref 38.4–76.8)
Platelets: 194 10*3/uL (ref 145–400)
RBC: 4.17 10*6/uL (ref 3.70–5.45)
RDW: 29.8 % — ABNORMAL HIGH (ref 11.2–14.5)
WBC: 4 10*3/uL (ref 3.9–10.3)
lymph#: 1.8 10*3/uL (ref 0.9–3.3)

## 2011-10-10 NOTE — Progress Notes (Signed)
Met with patient and daughter in lobby to give lab results; patient states that she is having hair loss, dark nails and itching; patient to decrease Hydrea to 500mg  once daily, and avoid direct sunlight, per Dr. Gaylyn Rong.

## 2011-10-16 ENCOUNTER — Telehealth: Payer: Self-pay | Admitting: *Deleted

## 2011-10-16 ENCOUNTER — Other Ambulatory Visit: Payer: Medicare Other

## 2011-10-16 ENCOUNTER — Ambulatory Visit: Payer: Medicare Other | Admitting: Oncology

## 2011-10-16 DIAGNOSIS — D45 Polycythemia vera: Secondary | ICD-10-CM

## 2011-10-16 MED ORDER — CENTRUM PO CHEW: 1.0000 | CHEWABLE_TABLET | Freq: Every day | ORAL | Status: DC

## 2011-10-16 NOTE — Addendum Note (Signed)
Addended by: Augusto Garbe on: 10/16/2011 09:48 AM   Modules accepted: Orders

## 2011-10-16 NOTE — Telephone Encounter (Signed)
Message on voicemail from pt.'s daughter, Synetta Fail asking if her mom may resume her Centrum MVI while taking Hydrea.  Consulted with [facility] pharmacist notifying her of pt.'s diagnosis.  No interaction with MVI and hydrea per pharmacist.  Patient's daughter given this information.

## 2011-10-24 ENCOUNTER — Telehealth: Payer: Self-pay | Admitting: *Deleted

## 2011-10-24 ENCOUNTER — Other Ambulatory Visit (HOSPITAL_BASED_OUTPATIENT_CLINIC_OR_DEPARTMENT_OTHER): Payer: Medicare Other | Admitting: Lab

## 2011-10-24 DIAGNOSIS — D473 Essential (hemorrhagic) thrombocythemia: Secondary | ICD-10-CM

## 2011-10-24 DIAGNOSIS — D751 Secondary polycythemia: Secondary | ICD-10-CM

## 2011-10-24 DIAGNOSIS — F418 Other specified anxiety disorders: Secondary | ICD-10-CM

## 2011-10-24 DIAGNOSIS — R197 Diarrhea, unspecified: Secondary | ICD-10-CM

## 2011-10-24 DIAGNOSIS — D45 Polycythemia vera: Secondary | ICD-10-CM

## 2011-10-24 DIAGNOSIS — D75839 Thrombocytosis, unspecified: Secondary | ICD-10-CM

## 2011-10-24 LAB — COMPREHENSIVE METABOLIC PANEL
ALT: 8 U/L (ref 0–35)
AST: 17 U/L (ref 0–37)
Albumin: 4 g/dL (ref 3.5–5.2)
Alkaline Phosphatase: 43 U/L (ref 39–117)
BUN: 26 mg/dL — ABNORMAL HIGH (ref 6–23)
CO2: 30 mEq/L (ref 19–32)
Calcium: 9.6 mg/dL (ref 8.4–10.5)
Chloride: 96 mEq/L (ref 96–112)
Creatinine, Ser: 1.05 mg/dL (ref 0.50–1.10)
Glucose, Bld: 86 mg/dL (ref 70–99)
Potassium: 4.3 mEq/L (ref 3.5–5.3)
Sodium: 134 mEq/L — ABNORMAL LOW (ref 135–145)
Total Bilirubin: 1.3 mg/dL — ABNORMAL HIGH (ref 0.3–1.2)
Total Protein: 6.7 g/dL (ref 6.0–8.3)

## 2011-10-24 LAB — CBC WITH DIFFERENTIAL/PLATELET
BASO%: 0.3 % (ref 0.0–2.0)
Basophils Absolute: 0 10*3/uL (ref 0.0–0.1)
EOS%: 0.9 % (ref 0.0–7.0)
Eosinophils Absolute: 0 10*3/uL (ref 0.0–0.5)
HCT: 35.8 % (ref 34.8–46.6)
HGB: 11.8 g/dL (ref 11.6–15.9)
LYMPH%: 38.1 % (ref 14.0–49.7)
MCH: 30.6 pg (ref 25.1–34.0)
MCHC: 33 g/dL (ref 31.5–36.0)
MCV: 92.9 fL (ref 79.5–101.0)
MONO#: 0.4 10*3/uL (ref 0.1–0.9)
MONO%: 9.2 % (ref 0.0–14.0)
NEUT#: 2 10*3/uL (ref 1.5–6.5)
NEUT%: 51.5 % (ref 38.4–76.8)
Platelets: 194 10*3/uL (ref 145–400)
RBC: 3.85 10*6/uL (ref 3.70–5.45)
RDW: 29.1 % — ABNORMAL HIGH (ref 11.2–14.5)
WBC: 3.8 10*3/uL — ABNORMAL LOW (ref 3.9–10.3)
lymph#: 1.4 10*3/uL (ref 0.9–3.3)

## 2011-10-24 NOTE — Telephone Encounter (Signed)
Called daughter and left VM informing of CBC,  Platelet, results.  Instructed for pt to continue Hydrea, same dose, no change and keep next appt as scheduled on 11/14/11.  Instructed her to call back if any questions or wants more detailed information.

## 2011-10-24 NOTE — Telephone Encounter (Signed)
Message copied by Wende Mott on Wed Oct 24, 2011  4:57 PM ------      Message from: HA, Raliegh Ip T      Created: Wed Oct 24, 2011  3:25 PM       Please call Synetta Fail.  Her CBC is relatively stable.  Continue Hydrea.  No change in dose.   Thanks.

## 2011-10-26 ENCOUNTER — Telehealth: Payer: Self-pay | Admitting: *Deleted

## 2011-10-26 NOTE — Telephone Encounter (Signed)
Daughter left VM asking if ok for pt to receive Shingles Vaccine while on Hydrea?  She also requests office notes/labs be faxed to Elpidio Anis at [facility].  Faxed office visit and labs to her at fax# [phone].

## 2011-10-26 NOTE — Telephone Encounter (Signed)
No problem with shingle vaccination.

## 2011-10-26 NOTE — Telephone Encounter (Signed)
Called Synetta Fail back and informed of ok per Dr. Gaylyn Rong for pt to get Shingles Vaccine while on Hydrea.  She verbalized understanding.

## 2011-11-02 ENCOUNTER — Other Ambulatory Visit: Payer: Self-pay | Admitting: Oncology

## 2011-11-02 ENCOUNTER — Telehealth: Payer: Self-pay | Admitting: *Deleted

## 2011-11-02 DIAGNOSIS — D45 Polycythemia vera: Secondary | ICD-10-CM

## 2011-11-02 MED ORDER — HYDROXYUREA 300 MG PO CAPS: 300.0000 mg | ORAL_CAPSULE | Freq: Every day | ORAL | Status: AC

## 2011-11-02 NOTE — Telephone Encounter (Signed)
Daughter left VM last night stating pt continues to suffer from side effects of Hydrea,  Such as "very very tired,  Hair loss and nail bed color changes."  Says the fatigue and hair loss are the most disturbing to pt..  Asks if dose can be decreased?   Pt has been taking once daily 500 mg since 10/10/11.   Per Dr. Gaylyn Rong pt can decrease dose to 300 mg daily.  He sent new rx to [facility].   Informed daughter of new dose and to call if pharmacy has any problems getting this dose as it is uncommon.  Keep next appt as scheduled on 8/21.  She verbalized understanding.

## 2011-11-14 ENCOUNTER — Ambulatory Visit (HOSPITAL_BASED_OUTPATIENT_CLINIC_OR_DEPARTMENT_OTHER): Payer: Medicare Other | Admitting: Oncology

## 2011-11-14 ENCOUNTER — Other Ambulatory Visit (HOSPITAL_BASED_OUTPATIENT_CLINIC_OR_DEPARTMENT_OTHER): Payer: Medicare Other | Admitting: Lab

## 2011-11-14 ENCOUNTER — Other Ambulatory Visit: Payer: Medicare Other | Admitting: Lab

## 2011-11-14 ENCOUNTER — Telehealth: Payer: Self-pay | Admitting: Oncology

## 2011-11-14 VITALS — BP 109/72 | HR 81 | Temp 97.1°F | Resp 18 | Ht 62.0 in | Wt 118.0 lb

## 2011-11-14 DIAGNOSIS — L659 Nonscarring hair loss, unspecified: Secondary | ICD-10-CM

## 2011-11-14 DIAGNOSIS — R5383 Other fatigue: Secondary | ICD-10-CM

## 2011-11-14 DIAGNOSIS — D75839 Thrombocytosis, unspecified: Secondary | ICD-10-CM

## 2011-11-14 DIAGNOSIS — F3289 Other specified depressive episodes: Secondary | ICD-10-CM

## 2011-11-14 DIAGNOSIS — D473 Essential (hemorrhagic) thrombocythemia: Secondary | ICD-10-CM

## 2011-11-14 DIAGNOSIS — D45 Polycythemia vera: Secondary | ICD-10-CM

## 2011-11-14 DIAGNOSIS — F329 Major depressive disorder, single episode, unspecified: Secondary | ICD-10-CM

## 2011-11-14 LAB — COMPREHENSIVE METABOLIC PANEL
ALT: 9 U/L (ref 0–35)
AST: 15 U/L (ref 0–37)
Albumin: 4 g/dL (ref 3.5–5.2)
Alkaline Phosphatase: 37 U/L — ABNORMAL LOW (ref 39–117)
BUN: 32 mg/dL — ABNORMAL HIGH (ref 6–23)
CO2: 30 mEq/L (ref 19–32)
Calcium: 9.1 mg/dL (ref 8.4–10.5)
Chloride: 99 mEq/L (ref 96–112)
Creatinine, Ser: 1.17 mg/dL — ABNORMAL HIGH (ref 0.50–1.10)
Glucose, Bld: 153 mg/dL — ABNORMAL HIGH (ref 70–99)
Potassium: 3.9 mEq/L (ref 3.5–5.3)
Sodium: 137 mEq/L (ref 135–145)
Total Bilirubin: 1 mg/dL (ref 0.3–1.2)
Total Protein: 6.6 g/dL (ref 6.0–8.3)

## 2011-11-14 LAB — CBC WITH DIFFERENTIAL/PLATELET
BASO%: 0.2 % (ref 0.0–2.0)
Basophils Absolute: 0 10*3/uL (ref 0.0–0.1)
EOS%: 1.1 % (ref 0.0–7.0)
Eosinophils Absolute: 0.1 10*3/uL (ref 0.0–0.5)
HCT: 35.7 % (ref 34.8–46.6)
HGB: 11.7 g/dL (ref 11.6–15.9)
LYMPH%: 35.5 % (ref 14.0–49.7)
MCH: 30.9 pg (ref 25.1–34.0)
MCHC: 32.8 g/dL (ref 31.5–36.0)
MCV: 94.2 fL (ref 79.5–101.0)
MONO#: 0.5 10*3/uL (ref 0.1–0.9)
MONO%: 9.6 % (ref 0.0–14.0)
NEUT#: 3 10*3/uL (ref 1.5–6.5)
NEUT%: 53.6 % (ref 38.4–76.8)
Platelets: 225 10*3/uL (ref 145–400)
RBC: 3.79 10*6/uL (ref 3.70–5.45)
RDW: 20.7 % — ABNORMAL HIGH (ref 11.2–14.5)
WBC: 5.5 10*3/uL (ref 3.9–10.3)
lymph#: 2 10*3/uL (ref 0.9–3.3)

## 2011-11-14 LAB — TSH: TSH: 1.612 u[IU]/mL (ref 0.350–4.500)

## 2011-11-14 NOTE — Progress Notes (Signed)
[facility]  Telephone:([phone] Fax:([phone]   OFFICE PROGRESS NOTE   Cc:  BOUSKA,DAVID E, MD  DIAGNOSIS:   JAK-2 mutation positive polycythemia vera (PV)  CURRENT THERAPY:  Hydrea 300mg  PO daily. Hydrea was started in May 2013.   INTERVAL HISTORY: Carolyn Figueroa 66 y.o. female returns for regular follow up with her daughter for a routine visit. She was started on Hydrea in May initially at 500 mg BID. Dose has been decreased due to mild leukopenia and side effects and she has been on Hydrea 300 mg daily for about 2 weeks now. She is still fatigued. Hair is falling out still and she has not noticed any improvement with decrease in her dose. She remains depressed ever since her husband's death from metastatic pancreatic cancer in March 2013.  She remains on Celexa last week. She has a decreased appetite, but weight is up by 6 lbs.  She has no drive to do anything outside of the house.    Patient denies headache, visual changes, confusion, drenching night sweats, palpable lymph node swelling, mucositis, odynophagia, dysphagia, nausea vomiting, jaundice, chest pain, palpitation, shortness of breath, dyspnea on exertion, productive cough, gum bleeding, epistaxis, hematemesis, hemoptysis, abdominal pain, abdominal swelling, early satiety, melena, hematochezia, hematuria, skin rash, spontaneous bleeding, joint swelling, joint pain, heat or cold intolerance, bowel bladder incontinence, back pain, focal motor weakness, paresthesia.     Past Medical History  Diagnosis Date  . Depression with anxiety   . Polycythemia   . Thrombocytosis   . Vulvar candidiasis   . Insomnia   . Varicose vein   . Colon polyp 05/2003  . HTN (hypertension)   . Vitamin d deficiency   . 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  Current Outpatient Prescriptions  Medication Sig Dispense Refill  . citalopram (CELEXA) 10 MG tablet Take 10 mg by mouth daily.      Marland Kitchen  conjugated estrogens (PREMARIN) vaginal cream Place 0.5 g vaginally 3 (three) times a week.      . hydroxyurea (DROXIA) 300 MG capsule Take 1 capsule (300 mg total) by mouth daily. May take with food to minimize GI side effects.  30 capsule  3  . lisinopril (PRINIVIL,ZESTRIL) 20 MG tablet Take 20 mg by mouth daily.      Marland Kitchen LORazepam (ATIVAN) 0.5 MG tablet Take 0.5 mg by mouth 2 (two) times daily.      . multivitamin-iron-minerals-folic acid (CENTRUM) chewable tablet Chew 1 tablet by mouth daily.  30 tablet  0    ALLERGIES:   has no known allergies.  REVIEW OF SYSTEMS:  The rest of the 14-point review of system was negative.   Filed Vitals:   11/14/11 1444  BP: 109/72  Pulse: 81  Temp: 97.1 F (36.2 C)  Resp: 18   Wt Readings from Last 3 Encounters:  11/14/11 118 lb (53.524 kg)  08/15/11 112 lb 11.2 oz (51.12 kg)  07/18/11 119 lb 12.8 oz (54.341 kg)   ECOG Performance status: 1  PHYSICAL EXAMINATION:   General:  Thin-appearing woman, in no acute distress.  Eyes:  no scleral icterus.  ENT:  There were no oropharyngeal lesions.  Neck was without thyromegaly.  Lymphatics:  Negative cervical, supraclavicular or axillary adenopathy.  Respiratory: lungs were clear bilaterally without wheezing or crackles.  Cardiovascular:  Regular rate and rhythm, S1/S2, without murmur, rub or gallop.  There was no pedal edema.  GI:  abdomen was soft,  flat, nontender, nondistended, without organomegaly.  Muscoloskeletal:  no spinal tenderness of palpation of vertebral spine.  Skin exam was without echymosis, petichae.  Neuro exam was nonfocal.  Patient was able to get on and off exam table without assistance.  Gait was normal.  Patient was alerted and oriented.  Attention was good.   Language was appropriate.  Mood was depressed.  Speech was not pressured.  Thought content was not tangential.     LABORATORY/RADIOLOGY DATA:  Lab Results  Component Value Date   WBC 5.5 11/14/2011   HGB 11.7 11/14/2011   HCT  35.7 11/14/2011   PLT 225 11/14/2011   GLUCOSE 86 10/24/2011   ALKPHOS 43 10/24/2011   ALT 8 10/24/2011   AST 17 10/24/2011   NA 134* 10/24/2011   K 4.3 10/24/2011   CL 96 10/24/2011   CREATININE 1.05 10/24/2011   BUN 26* 10/24/2011   CO2 30 10/24/2011     ASSESSMENT AND PLAN:   1. Polycythemia and thrombocytosis:  Due to polycythemia vera (PV) - JAK-2 mutation positive confirming PV.  I explained to her that untreated PV is associated with increased risk of thrombosis.  Rarely, it can transform to myelofibrosis and AML.   - Treatment: She is on ASA 81mg  PO daily and will plan to continue this. Hydrea is currently 300mg  PO daily These measures have been proven in randomized trial to decrease the risk of thrombosis.  Plan is to continue Hydrea 300 mg daily. We will check her lab every 2 weeks. Will plan to reduce Hydrea dose as low as possible to maintain a normal platelet count. May consider changing Hydrea to 300 mg every other day pending labs.   2. Depression: she is going into a major depression.  I agree with Celexa.  I advised patient to consider psychotherapy as she may not have improvement of her depression with just Celexa.  They will think this over and let us know if she needs referral.    3. Fatigue/hair loss: Could be from [facility], but will also check her TSH today.  4. Follow-up: In about 3 months.    The length of time of the face-to-face encounter was 15 minutes. More than 50% of time was spent counseling and coordination of care.

## 2011-11-14 NOTE — Telephone Encounter (Signed)
Gave pt appt for lab, and MD for September and  October 2013

## 2011-11-14 NOTE — Patient Instructions (Signed)
Your blood counts look good today. Continue Hydrea 300 mg daily. Will plan to check labs every 2 weeks and will try to reduce Hydrea further if your counts are okay.  You will have a follow-up visit in about 3 months.

## 2011-11-15 ENCOUNTER — Telehealth: Payer: Self-pay | Admitting: *Deleted

## 2011-11-15 ENCOUNTER — Ambulatory Visit: Payer: Medicare Other | Admitting: Oncology

## 2011-11-15 ENCOUNTER — Other Ambulatory Visit: Payer: Medicare Other | Admitting: Lab

## 2011-11-15 NOTE — Telephone Encounter (Signed)
Called dau w/ results of TSH wnl and informed of results of CMP,  BUN/Creat slightly elevated.  Instructed to make sure pt staying hydrated and to f/u w/ pt's PCP.  I faxed lab results to Dr. Everlene Other at fax [phone].

## 2011-11-15 NOTE — Telephone Encounter (Signed)
Message copied by Wende Mott on Thu Nov 15, 2011  3:31 PM ------      Message from: Clenton Pare R      Created: Thu Nov 15, 2011  8:38 AM       Please call patient/daughter and let her know that TSH is normal.            Thanks      KC

## 2011-11-28 ENCOUNTER — Other Ambulatory Visit (HOSPITAL_BASED_OUTPATIENT_CLINIC_OR_DEPARTMENT_OTHER): Payer: Medicare Other | Admitting: Lab

## 2011-11-28 DIAGNOSIS — D473 Essential (hemorrhagic) thrombocythemia: Secondary | ICD-10-CM

## 2011-11-28 DIAGNOSIS — F341 Dysthymic disorder: Secondary | ICD-10-CM

## 2011-11-28 DIAGNOSIS — R197 Diarrhea, unspecified: Secondary | ICD-10-CM

## 2011-11-28 DIAGNOSIS — D45 Polycythemia vera: Secondary | ICD-10-CM

## 2011-11-28 LAB — CBC WITH DIFFERENTIAL/PLATELET
BASO%: 0.2 % (ref 0.0–2.0)
Basophils Absolute: 0 10*3/uL (ref 0.0–0.1)
EOS%: 1.1 % (ref 0.0–7.0)
Eosinophils Absolute: 0.1 10*3/uL (ref 0.0–0.5)
HCT: 33.8 % — ABNORMAL LOW (ref 34.8–46.6)
HGB: 11.1 g/dL — ABNORMAL LOW (ref 11.6–15.9)
LYMPH%: 36.6 % (ref 14.0–49.7)
MCH: 31.8 pg (ref 25.1–34.0)
MCHC: 32.8 g/dL (ref 31.5–36.0)
MCV: 96.8 fL (ref 79.5–101.0)
MONO#: 0.5 10*3/uL (ref 0.1–0.9)
MONO%: 9.7 % (ref 0.0–14.0)
NEUT#: 2.9 10*3/uL (ref 1.5–6.5)
NEUT%: 52.4 % (ref 38.4–76.8)
Platelets: 239 10*3/uL (ref 145–400)
RBC: 3.49 10*6/uL — ABNORMAL LOW (ref 3.70–5.45)
RDW: 16 % — ABNORMAL HIGH (ref 11.2–14.5)
WBC: 5.6 10*3/uL (ref 3.9–10.3)
lymph#: 2 10*3/uL (ref 0.9–3.3)

## 2011-11-29 ENCOUNTER — Other Ambulatory Visit: Payer: Self-pay

## 2011-11-29 DIAGNOSIS — D45 Polycythemia vera: Secondary | ICD-10-CM

## 2011-12-06 ENCOUNTER — Telehealth: Payer: Self-pay | Admitting: *Deleted

## 2011-12-06 MED ORDER — HYDROXYUREA 300 MG PO CAPS: 300.0000 mg | ORAL_CAPSULE | ORAL | Status: DC

## 2011-12-06 NOTE — Telephone Encounter (Signed)
Daughter called to report they picked up Hydrea from pharmacy but it was 500 mg not 300 mg.  She called to clarify,  Thought pt is to continue on the 300 mg every other day (not 500mg ).  Confirmed w/ Synetta Fail the correct dose is 300 mg every other day and Natalie's notes said she called in 300 mg capsules.  I called in 300 mg capsules, spoke directly w/ pharmacist at [facility], today and also sent electronically.  Informed Synetta Fail and to call us back if any further problems. She verbalized understanding.

## 2011-12-12 ENCOUNTER — Other Ambulatory Visit (HOSPITAL_BASED_OUTPATIENT_CLINIC_OR_DEPARTMENT_OTHER): Payer: Medicare Other | Admitting: Lab

## 2011-12-12 ENCOUNTER — Telehealth: Payer: Self-pay | Admitting: *Deleted

## 2011-12-12 DIAGNOSIS — D45 Polycythemia vera: Secondary | ICD-10-CM

## 2011-12-12 LAB — CBC WITH DIFFERENTIAL/PLATELET
BASO%: 0.6 % (ref 0.0–2.0)
Basophils Absolute: 0 10*3/uL (ref 0.0–0.1)
EOS%: 1.5 % (ref 0.0–7.0)
Eosinophils Absolute: 0.1 10*3/uL (ref 0.0–0.5)
HCT: 35.7 % (ref 34.8–46.6)
HGB: 12 g/dL (ref 11.6–15.9)
LYMPH%: 34.1 % (ref 14.0–49.7)
MCH: 33.6 pg (ref 25.1–34.0)
MCHC: 33.6 g/dL (ref 31.5–36.0)
MCV: 100.1 fL (ref 79.5–101.0)
MONO#: 0.5 10*3/uL (ref 0.1–0.9)
MONO%: 9.4 % (ref 0.0–14.0)
NEUT#: 3 10*3/uL (ref 1.5–6.5)
NEUT%: 54.4 % (ref 38.4–76.8)
Platelets: 324 10*3/uL (ref 145–400)
RBC: 3.56 10*6/uL — ABNORMAL LOW (ref 3.70–5.45)
RDW: 13.1 % (ref 11.2–14.5)
WBC: 5.5 10*3/uL (ref 3.9–10.3)
lymph#: 1.9 10*3/uL (ref 0.9–3.3)

## 2011-12-12 NOTE — Telephone Encounter (Signed)
Message copied by Wende Mott on Wed Dec 12, 2011  3:57 PM ------      Message from: HA, Raliegh Ip T      Created: Wed Dec 12, 2011  3:09 PM       Please advise her to continue Hydrea 300mg  PO every other day.  Thanks. She should consult with her PCP about antidepressant or even at a referral to a psychiatrist or therapist.

## 2011-12-12 NOTE — Telephone Encounter (Signed)
Daughter left VM earlier today states she thinks pt's antidepressant, Celexa, may be contributing to pt's hair loss.  States pt's hair loss worse now than it has been since starting on Hydrea.  Asks if we recommend any other antidepressants that might not cause hair loss?  Spoke w/ daughter and pt in lobby. Gave copy of CBC and instructed to continue the Hydrea every other day and keep next appt as scheduled. Instructed to contact PCP regarding antidepressant, Dr. Gaylyn Rong does not have opinion about antidepressant and should continue to be managed by PCP or Psychiatrist.  Daughter verbalized understanding. Pt asked if she can get off the Hydrea as she feels it is making her feel very tired.  Explained to pt she needs to be on some treatment for Polycythemia or risk of blood clots/ stroke is very high.  Encouraged her to continue medication as ordered for next month until she sees Dr. Gaylyn Rong again and then discuss w/ Dr. Gaylyn Rong.  There may be other treatment options available,  Pt needs to discuss w/ Dr. Gaylyn Rong.  She verbalized understanding.

## 2011-12-26 ENCOUNTER — Other Ambulatory Visit (HOSPITAL_BASED_OUTPATIENT_CLINIC_OR_DEPARTMENT_OTHER): Payer: Medicare Other | Admitting: Lab

## 2011-12-26 DIAGNOSIS — D45 Polycythemia vera: Secondary | ICD-10-CM

## 2011-12-26 LAB — CBC WITH DIFFERENTIAL/PLATELET
BASO%: 0.4 % (ref 0.0–2.0)
Basophils Absolute: 0 10*3/uL (ref 0.0–0.1)
EOS%: 1.3 % (ref 0.0–7.0)
Eosinophils Absolute: 0.1 10*3/uL (ref 0.0–0.5)
HCT: 36 % (ref 34.8–46.6)
HGB: 12 g/dL (ref 11.6–15.9)
LYMPH%: 26.5 % (ref 14.0–49.7)
MCH: 33.1 pg (ref 25.1–34.0)
MCHC: 33.4 g/dL (ref 31.5–36.0)
MCV: 99.1 fL (ref 79.5–101.0)
MONO#: 0.6 10*3/uL (ref 0.1–0.9)
MONO%: 9.1 % (ref 0.0–14.0)
NEUT#: 3.9 10*3/uL (ref 1.5–6.5)
NEUT%: 62.7 % (ref 38.4–76.8)
Platelets: 402 10*3/uL — ABNORMAL HIGH (ref 145–400)
RBC: 3.63 10*6/uL — ABNORMAL LOW (ref 3.70–5.45)
RDW: 13 % (ref 11.2–14.5)
WBC: 6.2 10*3/uL (ref 3.9–10.3)
lymph#: 1.6 10*3/uL (ref 0.9–3.3)

## 2011-12-27 ENCOUNTER — Telehealth: Payer: Self-pay

## 2011-12-27 NOTE — Telephone Encounter (Signed)
Message copied by Kallie Locks on Thu Dec 27, 2011  4:58 PM ------      Message from: HA, Raliegh Ip T      Created: Wed Dec 26, 2011  3:08 PM       No change in dose of hydrea.  Thanks.

## 2012-01-09 ENCOUNTER — Telehealth: Payer: Self-pay | Admitting: *Deleted

## 2012-01-09 ENCOUNTER — Other Ambulatory Visit: Payer: Medicare Other | Admitting: Lab

## 2012-01-09 DIAGNOSIS — L659 Nonscarring hair loss, unspecified: Secondary | ICD-10-CM

## 2012-01-09 DIAGNOSIS — D45 Polycythemia vera: Secondary | ICD-10-CM

## 2012-01-09 LAB — CBC WITH DIFFERENTIAL/PLATELET
BASO%: 0.6 % (ref 0.0–2.0)
Basophils Absolute: 0 10*3/uL (ref 0.0–0.1)
EOS%: 2 % (ref 0.0–7.0)
Eosinophils Absolute: 0.1 10*3/uL (ref 0.0–0.5)
HCT: 37.6 % (ref 34.8–46.6)
HGB: 12.4 g/dL (ref 11.6–15.9)
LYMPH%: 24.6 % (ref 14.0–49.7)
MCH: 32 pg (ref 25.1–34.0)
MCHC: 33 g/dL (ref 31.5–36.0)
MCV: 96.9 fL (ref 79.5–101.0)
MONO#: 0.6 10*3/uL (ref 0.1–0.9)
MONO%: 8.4 % (ref 0.0–14.0)
NEUT#: 4.4 10*3/uL (ref 1.5–6.5)
NEUT%: 64.4 % (ref 38.4–76.8)
Platelets: 466 10*3/uL — ABNORMAL HIGH (ref 145–400)
RBC: 3.88 10*6/uL (ref 3.70–5.45)
RDW: 12.7 % (ref 11.2–14.5)
WBC: 6.9 10*3/uL (ref 3.9–10.3)
lymph#: 1.7 10*3/uL (ref 0.9–3.3)

## 2012-01-09 MED ORDER — HYDROXYUREA 300 MG PO CAPS: 300.0000 mg | ORAL_CAPSULE | Freq: Every day | ORAL | Status: DC

## 2012-01-09 NOTE — Telephone Encounter (Signed)
Message copied by Wende Mott on Wed Jan 09, 2012  4:03 PM ------      Message from: HA, Raliegh Ip T      Created: Wed Jan 09, 2012  3:08 PM       Please call pt's daughter.  I recommended increasing Hydrea to 300mg  PO daily (from every other day).  Thanks.

## 2012-01-09 NOTE — Telephone Encounter (Signed)
Message copied by Wende Mott on Wed Jan 09, 2012  3:13 PM ------      Message from: HA, Raliegh Ip T      Created: Wed Jan 09, 2012  3:08 PM       Please call pt's daughter.  I recommended increasing Hydrea to 300mg  PO daily (from every other day).  Thanks.

## 2012-01-09 NOTE — Telephone Encounter (Signed)
Spoke w/ pt in lobby and gave lab results.  Instructed to increase Hydrea 300 mg to daily from every other day.  Pt states reluctance to increase dose due to side effects such as fatigue and hair loss.  Encouraged pt to try to increase for next 2 weeks, keep appt w/ Dr. Gaylyn Rong in 2 weeks and then can discuss other treatment options.  Pt asked about phlebotomy.  Instructed to discuss w/ Dr. Gaylyn Rong if this is appropriate treatment for her.  I did explain how procedure works, what to expect if she does get a phlebotomy.  Pt alone,  Her daughter not with her today.  Instructed to have her dau call if any questions.

## 2012-01-16 ENCOUNTER — Ambulatory Visit: Payer: Medicare Other | Admitting: Oncology

## 2012-01-16 ENCOUNTER — Other Ambulatory Visit: Payer: Medicare Other | Admitting: Lab

## 2012-01-22 NOTE — Patient Instructions (Addendum)
1.  Diagnosis:  Polycythemia vera (PV). 2.  Treatment:  Hydrea.   It's working to normalize your blood count.  I recommend continuing Hydrea 300mg  by mouth once daily.  3.  Follow up:  Lab in about 2 and 4 months.  Return visit in about 6 months.

## 2012-01-23 ENCOUNTER — Ambulatory Visit (HOSPITAL_BASED_OUTPATIENT_CLINIC_OR_DEPARTMENT_OTHER): Payer: Medicare Other | Admitting: Oncology

## 2012-01-23 ENCOUNTER — Telehealth: Payer: Self-pay | Admitting: Oncology

## 2012-01-23 ENCOUNTER — Other Ambulatory Visit (HOSPITAL_BASED_OUTPATIENT_CLINIC_OR_DEPARTMENT_OTHER): Payer: Medicare Other | Admitting: Lab

## 2012-01-23 VITALS — BP 127/78 | HR 79 | Temp 97.4°F | Resp 20 | Ht 62.0 in | Wt 120.6 lb

## 2012-01-23 DIAGNOSIS — D473 Essential (hemorrhagic) thrombocythemia: Secondary | ICD-10-CM

## 2012-01-23 DIAGNOSIS — F3289 Other specified depressive episodes: Secondary | ICD-10-CM

## 2012-01-23 DIAGNOSIS — D45 Polycythemia vera: Secondary | ICD-10-CM

## 2012-01-23 DIAGNOSIS — Z23 Encounter for immunization: Secondary | ICD-10-CM

## 2012-01-23 DIAGNOSIS — F329 Major depressive disorder, single episode, unspecified: Secondary | ICD-10-CM

## 2012-01-23 DIAGNOSIS — I10 Essential (primary) hypertension: Secondary | ICD-10-CM

## 2012-01-23 LAB — CBC WITH DIFFERENTIAL/PLATELET
BASO%: 0.8 % (ref 0.0–2.0)
Basophils Absolute: 0.1 10*3/uL (ref 0.0–0.1)
EOS%: 1.9 % (ref 0.0–7.0)
Eosinophils Absolute: 0.1 10*3/uL (ref 0.0–0.5)
HCT: 38.3 % (ref 34.8–46.6)
HGB: 12.6 g/dL (ref 11.6–15.9)
LYMPH%: 30.3 % (ref 14.0–49.7)
MCH: 31.7 pg (ref 25.1–34.0)
MCHC: 33 g/dL (ref 31.5–36.0)
MCV: 95.9 fL (ref 79.5–101.0)
MONO#: 0.5 10*3/uL (ref 0.1–0.9)
MONO%: 7.4 % (ref 0.0–14.0)
NEUT#: 3.9 10*3/uL (ref 1.5–6.5)
NEUT%: 59.6 % (ref 38.4–76.8)
Platelets: 455 10*3/uL — ABNORMAL HIGH (ref 145–400)
RBC: 3.99 10*6/uL (ref 3.70–5.45)
RDW: 12.9 % (ref 11.2–14.5)
WBC: 6.6 10*3/uL (ref 3.9–10.3)
lymph#: 2 10*3/uL (ref 0.9–3.3)

## 2012-01-23 MED ORDER — INFLUENZA VIRUS VACC SPLIT PF IM SUSP
0.5000 mL | INTRAMUSCULAR | Status: AC
  Administered 2012-01-23: 0.5 mL via INTRAMUSCULAR
  Filled 2012-01-23: qty 0.5

## 2012-01-23 NOTE — Progress Notes (Signed)
[facility]  [phone]) [phone] Fax:(336) [phone]   OFFICE PROGRESS NOTE   Cc:  BOUSKA,DAVID E, MD  DIAGNOSIS: JAK-2 mutation positive polycythemia vera (PV)   CURRENT THERAPY: started Hydrea since 07/2011.  Dose has been adjusted due to fatigue, hair thinning.   INTERVAL HISTORY: Carolyn Figueroa 66 y.o. female returns for regular follow up with her daughter.  She has been weaned off of Cymbalta.  Her mood now is much better than earlier this year.  Her appetite has slightly improved compared to before.  She no longer has weight loss; however, she has not regained her lost weight.  She leaves the houses now occasionally.  She played bridge with friends. She still has hair thinning.   Patient denies fever, anorexia, weight loss, fatigue, headache, visual changes, confusion, drenching night sweats, palpable lymph node swelling, mucositis, odynophagia, dysphagia, nausea vomiting, jaundice, chest pain, palpitation, shortness of breath, dyspnea on exertion, productive cough, gum bleeding, epistaxis, hematemesis, hemoptysis, abdominal pain, abdominal swelling, early satiety, melena, hematochezia, hematuria, skin rash, spontaneous bleeding, joint swelling, joint pain, heat or cold intolerance, bowel bladder incontinence, back pain, focal motor weakness, paresthesia.    Past Medical History  Diagnosis Date  . Depression with anxiety   . Polycythemia   . Thrombocytosis   . Vulvar candidiasis   . Insomnia   . Varicose vein   . Colon polyp 05/2003  . HTN (hypertension)   . Vitamin d deficiency   . 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  Current Outpatient Prescriptions  Medication Sig Dispense Refill  . aspirin 81 MG tablet Take 81 mg by mouth daily.      . hydroxyurea (DROXIA) 300 MG capsule Take 1 capsule (300 mg total) by mouth daily. May take with food to minimize GI side effects.  30 capsule  3  . lisinopril (PRINIVIL,ZESTRIL) 20 MG  tablet Take 20 mg by mouth daily.      . multivitamin-iron-minerals-folic acid (CENTRUM) chewable tablet Chew 1 tablet by mouth daily.  30 tablet  0  . conjugated estrogens (PREMARIN) vaginal cream Place 0.5 g vaginally 3 (three) times a week.      Marland Kitchen LORazepam (ATIVAN) 0.5 MG tablet Take 0.5 mg by mouth 2 (two) times daily.       Current Facility-Administered Medications  Medication Dose Route Frequency Provider Last Rate Last Dose  . influenza  inactive virus vaccine (FLUZONE/FLUARIX) injection 0.5 mL  0.5 mL Intramuscular Tomorrow-1000 Exie Parody, MD   0.5 mL at 01/23/12 1510    ALLERGIES:   has no known allergies.  REVIEW OF SYSTEMS:  The rest of the 14-point review of system was negative.   Filed Vitals:   01/23/12 1409  BP: 127/78  Pulse: 79  Temp: 97.4 F (36.3 C)  Resp: 20   Wt Readings from Last 3 Encounters:  01/23/12 120 lb 9.6 oz (54.704 kg)  11/14/11 118 lb (53.524 kg)  08/15/11 112 lb 11.2 oz (51.12 kg)   ECOG Performance status: 1  PHYSICAL EXAMINATION:   General:  well-nourished woman, in no acute distress.  Eyes:  no scleral icterus.  ENT:  There were no oropharyngeal lesions.  Neck was without thyromegaly.  Lymphatics:  Negative cervical, supraclavicular or axillary adenopathy.  Respiratory: lungs were clear bilaterally without wheezing or crackles.  Cardiovascular:  Regular rate and rhythm, S1/S2, without murmur, rub or gallop.  There was no pedal edema.  GI:  abdomen was soft, flat,  nontender, nondistended, without organomegaly.  Muscoloskeletal:  no spinal tenderness of palpation of vertebral spine.  Skin exam was without echymosis, petichae.  Neuro exam was nonfocal.  Patient was able to get on and off exam table without assistance.  Gait was normal.  Patient was alerted and oriented.  Attention was good.   Language was appropriate.  Mood was normal without depression.  Speech was not pressured.  Thought content was not tangential.    LABORATORY/RADIOLOGY  DATA:  Lab Results  Component Value Date   WBC 6.6 01/23/2012   HGB 12.6 01/23/2012   HCT 38.3 01/23/2012   PLT 455* 01/23/2012   GLUCOSE 153* 11/14/2011   ALKPHOS 37* 11/14/2011   ALT 9 11/14/2011   AST 15 11/14/2011   NA 137 11/14/2011   K 3.9 11/14/2011   CL 99 11/14/2011   CREATININE 1.17* 11/14/2011   BUN 32* 11/14/2011   CO2 30 11/14/2011     ASSESSMENT AND PLAN:    1. Polycythemia and thrombocytosis: due to JAK-2 positive myeloproliferative disease.  -Treatment:  Continue with daily Hydrea 300mg  PO daily.  She cannot tolerate higher dose very well.  Her plt count is only slightly elevated.  Her current dose of Hydrea of 300mg  PO daily was increased from 300mg  PO q48hours about 2 weeks ago.  It's too soon to adjust the dose now.   - Follow up:  Lab in 2 and 4 months.  Return visit in about 6 months. I advised her to continue Aspirin 81mg  PO daily.  Goal of this combination therapy is to decrease the risk of thrombotic complication.   2.  Hypertension:  On Lisinopril per PCP.   3.  Depression:  Weaned off of Cymbalta now per her request.  I defer to her PCP for further management.   4.  Flu shot:  We explained the pros/cons.  She would like to get a flu shot which were administered today.     The length of time of the face-to-face encounter was 15 minutes. More than 50% of time was spent counseling and coordination of care.

## 2012-01-23 NOTE — Telephone Encounter (Signed)
appts made and printed for pt aom °

## 2012-02-13 ENCOUNTER — Encounter: Payer: Self-pay | Admitting: Oncology

## 2012-03-24 ENCOUNTER — Other Ambulatory Visit: Payer: Self-pay | Admitting: Family Medicine

## 2012-03-24 ENCOUNTER — Other Ambulatory Visit: Payer: Self-pay | Admitting: Physician Assistant

## 2012-03-24 DIAGNOSIS — Z1231 Encounter for screening mammogram for malignant neoplasm of breast: Secondary | ICD-10-CM

## 2012-03-27 ENCOUNTER — Other Ambulatory Visit: Payer: Self-pay | Admitting: Family Medicine

## 2012-03-27 DIAGNOSIS — Z780 Asymptomatic menopausal state: Secondary | ICD-10-CM

## 2012-04-02 ENCOUNTER — Telehealth: Payer: Self-pay

## 2012-04-02 ENCOUNTER — Other Ambulatory Visit (HOSPITAL_BASED_OUTPATIENT_CLINIC_OR_DEPARTMENT_OTHER): Payer: Medicare Other | Admitting: Lab

## 2012-04-02 DIAGNOSIS — D473 Essential (hemorrhagic) thrombocythemia: Secondary | ICD-10-CM

## 2012-04-02 LAB — CBC WITH DIFFERENTIAL/PLATELET
BASO%: 0.5 % (ref 0.0–2.0)
Basophils Absolute: 0 10*3/uL (ref 0.0–0.1)
EOS%: 2.4 % (ref 0.0–7.0)
Eosinophils Absolute: 0.2 10*3/uL (ref 0.0–0.5)
HCT: 41.7 % (ref 34.8–46.6)
HGB: 13.8 g/dL (ref 11.6–15.9)
LYMPH%: 27.4 % (ref 14.0–49.7)
MCH: 30.8 pg (ref 25.1–34.0)
MCHC: 33.1 g/dL (ref 31.5–36.0)
MCV: 93.2 fL (ref 79.5–101.0)
MONO#: 0.6 10*3/uL (ref 0.1–0.9)
MONO%: 7.7 % (ref 0.0–14.0)
NEUT#: 4.6 10*3/uL (ref 1.5–6.5)
NEUT%: 62 % (ref 38.4–76.8)
Platelets: 463 10*3/uL — ABNORMAL HIGH (ref 145–400)
RBC: 4.47 10*6/uL (ref 3.70–5.45)
RDW: 15 % — ABNORMAL HIGH (ref 11.2–14.5)
WBC: 7.4 10*3/uL (ref 3.9–10.3)
lymph#: 2 10*3/uL (ref 0.9–3.3)

## 2012-04-02 NOTE — Telephone Encounter (Signed)
Message copied by Kallie Locks on Wed Apr 02, 2012  2:46 PM ------      Message from: HA, Raliegh Ip T      Created: Wed Apr 02, 2012  2:28 PM       Please call pt's daughter.  No change to her dose of Hydrea.  Thanks.

## 2012-04-30 ENCOUNTER — Ambulatory Visit
Admission: RE | Admit: 2012-04-30 | Discharge: 2012-04-30 | Disposition: A | Discharge status: Home / Self Care | Payer: Medicare Other | Source: Ambulatory Visit | Attending: Family Medicine | Admitting: Family Medicine

## 2012-04-30 DIAGNOSIS — Z1231 Encounter for screening mammogram for malignant neoplasm of breast: Secondary | ICD-10-CM

## 2012-04-30 DIAGNOSIS — Z780 Asymptomatic menopausal state: Secondary | ICD-10-CM

## 2012-05-28 ENCOUNTER — Telehealth: Payer: Self-pay | Admitting: *Deleted

## 2012-05-28 ENCOUNTER — Other Ambulatory Visit (HOSPITAL_BASED_OUTPATIENT_CLINIC_OR_DEPARTMENT_OTHER): Payer: Medicare Other

## 2012-05-28 DIAGNOSIS — D45 Polycythemia vera: Secondary | ICD-10-CM

## 2012-05-28 DIAGNOSIS — D473 Essential (hemorrhagic) thrombocythemia: Secondary | ICD-10-CM

## 2012-05-28 LAB — CBC WITH DIFFERENTIAL/PLATELET
BASO%: 0.6 % (ref 0.0–2.0)
Basophils Absolute: 0 10*3/uL (ref 0.0–0.1)
EOS%: 2.2 % (ref 0.0–7.0)
Eosinophils Absolute: 0.2 10*3/uL (ref 0.0–0.5)
HCT: 43.1 % (ref 34.8–46.6)
HGB: 13.8 g/dL (ref 11.6–15.9)
LYMPH%: 29.8 % (ref 14.0–49.7)
MCH: 29.5 pg (ref 25.1–34.0)
MCHC: 32 g/dL (ref 31.5–36.0)
MCV: 92.1 fL (ref 79.5–101.0)
MONO#: 0.6 10*3/uL (ref 0.1–0.9)
MONO%: 8.1 % (ref 0.0–14.0)
NEUT#: 4 10*3/uL (ref 1.5–6.5)
NEUT%: 59.3 % (ref 38.4–76.8)
Platelets: 519 10*3/uL — ABNORMAL HIGH (ref 145–400)
RBC: 4.68 10*6/uL (ref 3.70–5.45)
RDW: 14.2 % (ref 11.2–14.5)
WBC: 6.8 10*3/uL (ref 3.9–10.3)
lymph#: 2 10*3/uL (ref 0.9–3.3)

## 2012-05-28 MED ORDER — HYDROXYUREA 500 MG PO CAPS: 500.0000 mg | ORAL_CAPSULE | Freq: Every day | ORAL | Status: DC

## 2012-05-28 NOTE — Telephone Encounter (Signed)
Spoke w/ pt and daughter in lobby this morning.  Gave them lab results and they both say pt continues to take hydrea 300 mg daily.   Called daughter later w/ Dr. Lodema Pilot message and informed of new order to increase dose to 500 mg daily.  Rx e-scribed to [employer] [facility].  Instructed to call back if any questions.

## 2012-05-28 NOTE — Telephone Encounter (Signed)
Message copied by Wende Mott on Wed May 28, 2012  4:54 PM ------      Message from: HA, Raliegh Ip T      Created: Wed May 28, 2012  3:11 PM       Please call her daughter.  If Ms. Mcgough is taking Hydrea 300mg  PO daily now; then we need to increase it to 500mg  PO daily (a new Rx needed).  Thanks. ------

## 2012-07-30 ENCOUNTER — Other Ambulatory Visit (HOSPITAL_BASED_OUTPATIENT_CLINIC_OR_DEPARTMENT_OTHER): Payer: Medicare Other | Admitting: Lab

## 2012-07-30 ENCOUNTER — Ambulatory Visit (HOSPITAL_BASED_OUTPATIENT_CLINIC_OR_DEPARTMENT_OTHER): Payer: Medicare Other | Admitting: Oncology

## 2012-07-30 ENCOUNTER — Telehealth: Payer: Self-pay | Admitting: Oncology

## 2012-07-30 ENCOUNTER — Encounter: Payer: Self-pay | Admitting: Oncology

## 2012-07-30 VITALS — BP 116/80 | HR 85 | Temp 97.6°F | Resp 17 | Ht 62.0 in | Wt 127.8 lb

## 2012-07-30 DIAGNOSIS — D45 Polycythemia vera: Secondary | ICD-10-CM

## 2012-07-30 DIAGNOSIS — D473 Essential (hemorrhagic) thrombocythemia: Secondary | ICD-10-CM

## 2012-07-30 DIAGNOSIS — D75839 Thrombocytosis, unspecified: Secondary | ICD-10-CM

## 2012-07-30 DIAGNOSIS — N289 Disorder of kidney and ureter, unspecified: Secondary | ICD-10-CM

## 2012-07-30 LAB — CBC WITH DIFFERENTIAL/PLATELET
BASO%: 0.6 % (ref 0.0–2.0)
Basophils Absolute: 0 10*3/uL (ref 0.0–0.1)
EOS%: 2.3 % (ref 0.0–7.0)
Eosinophils Absolute: 0.1 10*3/uL (ref 0.0–0.5)
HCT: 42.1 % (ref 34.8–46.6)
HGB: 13.7 g/dL (ref 11.6–15.9)
LYMPH%: 34 % (ref 14.0–49.7)
MCH: 29.8 pg (ref 25.1–34.0)
MCHC: 32.5 g/dL (ref 31.5–36.0)
MCV: 91.8 fL (ref 79.5–101.0)
MONO#: 0.5 10*3/uL (ref 0.1–0.9)
MONO%: 8.1 % (ref 0.0–14.0)
NEUT#: 3.4 10*3/uL (ref 1.5–6.5)
NEUT%: 55 % (ref 38.4–76.8)
Platelets: 379 10*3/uL (ref 145–400)
RBC: 4.59 10*6/uL (ref 3.70–5.45)
RDW: 16 % — ABNORMAL HIGH (ref 11.2–14.5)
WBC: 6.2 10*3/uL (ref 3.9–10.3)
lymph#: 2.1 10*3/uL (ref 0.9–3.3)

## 2012-07-30 LAB — COMPREHENSIVE METABOLIC PANEL (CC13)
ALT: 10 U/L (ref 0–55)
AST: 19 U/L (ref 5–34)
Albumin: 3.7 g/dL (ref 3.5–5.0)
Alkaline Phosphatase: 61 U/L (ref 40–150)
BUN: 30 mg/dL — ABNORMAL HIGH (ref 7.0–26.0)
CO2: 31 mEq/L — ABNORMAL HIGH (ref 22–29)
Calcium: 9.6 mg/dL (ref 8.4–10.4)
Chloride: 102 mEq/L (ref 98–107)
Creatinine: 1.4 mg/dL — ABNORMAL HIGH (ref 0.6–1.1)
Glucose: 102 mg/dl — ABNORMAL HIGH (ref 70–99)
Potassium: 4.6 mEq/L (ref 3.5–5.1)
Sodium: 139 mEq/L (ref 136–145)
Total Bilirubin: 0.94 mg/dL (ref 0.20–1.20)
Total Protein: 7.2 g/dL (ref 6.4–8.3)

## 2012-07-30 MED ORDER — HYDROXYUREA 500 MG PO CAPS: 500.0000 mg | ORAL_CAPSULE | Freq: Every day | ORAL | Status: DC

## 2012-07-30 NOTE — Progress Notes (Signed)
[facility]  Telephone:(336) [phone] Fax:(336) [phone]   OFFICE PROGRESS NOTE   Cc:  BOUSKA,DAVID E, MD  DIAGNOSIS: JAK-2 mutation positive polycythemia vera (PV)   CURRENT THERAPY: started Hydrea since 07/2011.  Dose has been adjusted due to fatigue, hair thinning.   INTERVAL HISTORY: Carolyn Figueroa 67 y.o. female returns for regular follow up with her daughter.  She has recently started on low dose Zoloft and undergoing counseling. Her mood now is much better than earlier this year.  Her appetite has slightly improved compared to before.  She no longer has weight loss. She still has hair thinning. She has dry skin.  Patient denies fever, anorexia, weight loss, fatigue, headache, visual changes, confusion, drenching night sweats, palpable lymph node swelling, mucositis, odynophagia, dysphagia, nausea vomiting, jaundice, chest pain, palpitation, shortness of breath, dyspnea on exertion, productive cough, gum bleeding, epistaxis, hematemesis, hemoptysis, abdominal pain, abdominal swelling, early satiety, melena, hematochezia, hematuria, skin rash, spontaneous bleeding, joint swelling, joint pain, heat or cold intolerance, bowel bladder incontinence, back pain, focal motor weakness, paresthesia.    Past Medical History  Diagnosis Date  . Depression with anxiety   . Polycythemia   . Thrombocytosis   . Vulvar candidiasis   . Insomnia   . Varicose vein   . Colon polyp 05/2003  . HTN (hypertension)   . Vitamin D deficiency   . Polycythemia vera 08/15/2011    History reviewed. No pertinent past surgical history.  Current Outpatient Prescriptions  Medication Sig Dispense Refill  . Biotin 1 MG CAPS Take 1 tablet by mouth daily.      . sertraline (ZOLOFT) 25 MG tablet Take 12.5 mg by mouth daily.      Marland Kitchen aspirin 81 MG tablet Take 81 mg by mouth daily.      Marland Kitchen conjugated estrogens (PREMARIN) vaginal cream Place 0.5 g vaginally 3 (three) times a week.      . hydroxyurea  (HYDREA) 500 MG capsule Take 1 capsule (500 mg total) by mouth daily. May take with food to minimize GI side effects.  30 capsule  4  . lisinopril (PRINIVIL,ZESTRIL) 20 MG tablet Take 20 mg by mouth daily.       No current facility-administered medications for this visit.    ALLERGIES:  has No Known Allergies.  REVIEW OF SYSTEMS:  The rest of the 14-point review of system was negative.   Filed Vitals:   07/30/12 1428  BP: 116/80  Pulse: 85  Temp: 97.6 F (36.4 C)  Resp: 17   Wt Readings from Last 3 Encounters:  07/30/12 127 lb 12.8 oz (57.97 kg)  01/23/12 120 lb 9.6 oz (54.704 kg)  11/14/11 118 lb (53.524 kg)   ECOG Performance status: 1  PHYSICAL EXAMINATION:   General:  well-nourished woman, in no acute distress.  Eyes:  no scleral icterus.  ENT:  There were no oropharyngeal lesions.  Neck was without thyromegaly.  Lymphatics:  Negative cervical, supraclavicular or axillary adenopathy.  Respiratory: lungs were clear bilaterally without wheezing or crackles.  Cardiovascular:  Regular rate and rhythm, S1/S2, without murmur, rub or gallop.  There was no pedal edema.  GI:  abdomen was soft, flat, nontender, nondistended, without organomegaly.  Muscoloskeletal:  no spinal tenderness of palpation of vertebral spine.  Skin exam was without echymosis, petichae.  Neuro exam was nonfocal.  Patient was able to get on and off exam table without assistance.  Gait was normal.  Patient was alerted and oriented.  Attention was  good.   Language was appropriate.  Mood was normal without depression.  Speech was not pressured.  Thought content was not tangential.    LABORATORY/RADIOLOGY DATA:  Lab Results  Component Value Date   WBC 6.2 07/30/2012   HGB 13.7 07/30/2012   HCT 42.1 07/30/2012   PLT 379 07/30/2012   GLUCOSE 102* 07/30/2012   ALKPHOS 61 07/30/2012   ALT 10 07/30/2012   AST 19 07/30/2012   NA 139 07/30/2012   K 4.6 07/30/2012   CL 102 07/30/2012   CREATININE 1.4* 07/30/2012   BUN 30.0* 07/30/2012   CO2  31* 07/30/2012     ASSESSMENT AND PLAN:    1. Polycythemia and thrombocytosis: due to JAK-2 positive myeloproliferative disease.  -Treatment:  Continue with daily Hydrea 500 mg PO daily.  She cannot tolerate higher dose very well.   - Follow up:  Lab in 2 and 4 months.  Return visit in about 6 months. I advised her to continue Aspirin 81mg  PO daily.  Goal of this combination therapy is to decrease the risk of thrombotic complication.   2.  Hypertension:  On Lisinopril per PCP.   3.  Depression:  On Zoloft and undergoing counseling.   4. Renal insufficiency: Not a common side effect of Hydrea. Likely due to HTN and use of ACE inhibitor, however, will rule out other causes. I have sent for an SPEP and light chains today to rule out myeloma. I have also requested a renal ultrasound to rule out obstruction. Will refer back to PCP once malignancy has been ruled out.  5.  Follow-up: in 6 months     The length of time of the face-to-face encounter was 30 minutes. More than 50% of time was spent counseling and coordination of care.

## 2012-07-30 NOTE — Telephone Encounter (Signed)
gv and printed appt sched and avs for pt  °

## 2012-07-31 ENCOUNTER — Telehealth: Payer: Self-pay | Admitting: Oncology

## 2012-08-01 ENCOUNTER — Other Ambulatory Visit (HOSPITAL_BASED_OUTPATIENT_CLINIC_OR_DEPARTMENT_OTHER): Payer: Medicare Other

## 2012-08-01 DIAGNOSIS — N289 Disorder of kidney and ureter, unspecified: Secondary | ICD-10-CM

## 2012-08-01 DIAGNOSIS — D473 Essential (hemorrhagic) thrombocythemia: Secondary | ICD-10-CM

## 2012-08-05 ENCOUNTER — Telehealth: Payer: Self-pay

## 2012-08-05 LAB — PROTEIN ELECTROPHORESIS, SERUM
Albumin ELP: 58.9 % (ref 55.8–66.1)
Alpha-1-Globulin: 3.6 % (ref 2.9–4.9)
Alpha-2-Globulin: 9.1 % (ref 7.1–11.8)
Beta 2: 3.6 % (ref 3.2–6.5)
Beta Globulin: 5.9 % (ref 4.7–7.2)
Gamma Globulin: 18.9 % — ABNORMAL HIGH (ref 11.1–18.8)
Total Protein, Serum Electrophoresis: 6.8 g/dL (ref 6.0–8.3)

## 2012-08-05 LAB — KAPPA/LAMBDA LIGHT CHAINS
Kappa free light chain: 2.59 mg/dL — ABNORMAL HIGH (ref 0.33–1.94)
Kappa:Lambda Ratio: 1.3 (ref 0.26–1.65)
Lambda Free Lght Chn: 2 mg/dL (ref 0.57–2.63)

## 2012-08-05 NOTE — Telephone Encounter (Signed)
Message copied by Kallie Locks on Tue Aug 05, 2012  4:53 PM ------      Message from: Myrtis Ser      Created: Tue Aug 05, 2012  2:45 PM       Please call daughter. Labs have returned and do not show any evidence of multiple myeloma (M-spike not detected and kappa:lambda ratio is normal). Please find out when the renal U/S is scheduled (I ordered this last week).             ------

## 2012-08-06 ENCOUNTER — Telehealth: Payer: Self-pay | Admitting: *Deleted

## 2012-08-06 NOTE — Telephone Encounter (Signed)
VM from daughter states pt has Renal U/S scheduled for tomorrow and unsure if any instructions.   She also wants to know if pt needs to be Monitored or checked in the future for Multiple myeloma again?  Called back and left her a VM instructing pt to arrive with a full bladder at 9:15 am tomorrow for [place].   Also pt does not need any further testing for myeloma, this has been ruled out and unless pt develops any new symptoms in the future, then no further testing is indicated per Clenton Pare, NP.   Asked her to call back if any further questions.

## 2012-08-07 ENCOUNTER — Telehealth: Payer: Self-pay | Admitting: *Deleted

## 2012-08-07 ENCOUNTER — Ambulatory Visit (HOSPITAL_COMMUNITY)
Admission: RE | Admit: 2012-08-07 | Discharge: 2012-08-07 | Disposition: A | Discharge status: Home / Self Care | Payer: Medicare Other | Source: Ambulatory Visit | Attending: Oncology | Admitting: Oncology

## 2012-08-07 DIAGNOSIS — I10 Essential (primary) hypertension: Secondary | ICD-10-CM | POA: Insufficient documentation

## 2012-08-07 DIAGNOSIS — N289 Disorder of kidney and ureter, unspecified: Secondary | ICD-10-CM

## 2012-08-07 DIAGNOSIS — R809 Proteinuria, unspecified: Secondary | ICD-10-CM | POA: Insufficient documentation

## 2012-08-07 NOTE — Telephone Encounter (Signed)
Left VM for Synetta Fail relaying Kristin's message below and instructed to call back if any questions.

## 2012-08-07 NOTE — Telephone Encounter (Signed)
Message copied by Wende Mott on Thu Aug 07, 2012  4:11 PM ------      Message from: Clenton Pare R      Created: Thu Aug 07, 2012  3:59 PM       Please call pt's daughter. Renal U/S did not show any evidence of obstruction. Recommend that patient f/u with PCP regarding mild renal insufficieny. Likely due to meds/HTN ------

## 2012-08-13 ENCOUNTER — Telehealth: Payer: Self-pay | Admitting: *Deleted

## 2012-08-13 NOTE — Telephone Encounter (Signed)
Dau called to request recent labs and renal u/s be faxed to Dr. Everlene Other.  Pt has upcoming appt w/ Dr. Everlene Other.  Faxed U/S results, labs and most recent office visit to fax [phone].

## 2012-08-27 ENCOUNTER — Telehealth: Payer: Self-pay | Admitting: *Deleted

## 2012-08-27 NOTE — Telephone Encounter (Signed)
VM was left by daughter to make sure that recent office visit,  U/S report and labs were sent to Dr. Everlene Other.  This RN had faxed reports on 08/13/12.  See note from 5/21,

## 2012-09-23 DIAGNOSIS — N3946 Mixed incontinence: Secondary | ICD-10-CM | POA: Insufficient documentation

## 2012-09-23 DIAGNOSIS — G4700 Insomnia, unspecified: Secondary | ICD-10-CM | POA: Insufficient documentation

## 2012-09-23 DIAGNOSIS — E559 Vitamin D deficiency, unspecified: Secondary | ICD-10-CM | POA: Insufficient documentation

## 2012-09-23 DIAGNOSIS — M8580 Other specified disorders of bone density and structure, unspecified site: Secondary | ICD-10-CM | POA: Insufficient documentation

## 2012-09-24 ENCOUNTER — Other Ambulatory Visit (HOSPITAL_BASED_OUTPATIENT_CLINIC_OR_DEPARTMENT_OTHER): Payer: Medicare Other | Admitting: Lab

## 2012-09-24 ENCOUNTER — Telehealth: Payer: Self-pay | Admitting: *Deleted

## 2012-09-24 DIAGNOSIS — D45 Polycythemia vera: Secondary | ICD-10-CM

## 2012-09-24 DIAGNOSIS — D473 Essential (hemorrhagic) thrombocythemia: Secondary | ICD-10-CM

## 2012-09-24 DIAGNOSIS — D75839 Thrombocytosis, unspecified: Secondary | ICD-10-CM

## 2012-09-24 LAB — CBC WITH DIFFERENTIAL/PLATELET
BASO%: 0.5 % (ref 0.0–2.0)
Basophils Absolute: 0 10*3/uL (ref 0.0–0.1)
EOS%: 1.3 % (ref 0.0–7.0)
Eosinophils Absolute: 0.1 10*3/uL (ref 0.0–0.5)
HCT: 40.2 % (ref 34.8–46.6)
HGB: 13.3 g/dL (ref 11.6–15.9)
LYMPH%: 31.3 % (ref 14.0–49.7)
MCH: 30.4 pg (ref 25.1–34.0)
MCHC: 33.1 g/dL (ref 31.5–36.0)
MCV: 91.8 fL (ref 79.5–101.0)
MONO#: 0.5 10*3/uL (ref 0.1–0.9)
MONO%: 7.9 % (ref 0.0–14.0)
NEUT#: 3.8 10*3/uL (ref 1.5–6.5)
NEUT%: 59 % (ref 38.4–76.8)
Platelets: 359 10*3/uL (ref 145–400)
RBC: 4.38 10*6/uL (ref 3.70–5.45)
RDW: 15.3 % — ABNORMAL HIGH (ref 11.2–14.5)
WBC: 6.4 10*3/uL (ref 3.9–10.3)
lymph#: 2 10*3/uL (ref 0.9–3.3)

## 2012-09-24 NOTE — Telephone Encounter (Signed)
Dau left VM to clarify pt's scheduled.  Called back and left VM informing of schedule dates/times and asked her to call back again if any questions.

## 2012-10-29 ENCOUNTER — Other Ambulatory Visit: Payer: Self-pay

## 2012-11-19 ENCOUNTER — Telehealth: Payer: Self-pay | Admitting: *Deleted

## 2012-11-19 ENCOUNTER — Other Ambulatory Visit (HOSPITAL_BASED_OUTPATIENT_CLINIC_OR_DEPARTMENT_OTHER): Payer: Medicare Other | Admitting: Lab

## 2012-11-19 DIAGNOSIS — D473 Essential (hemorrhagic) thrombocythemia: Secondary | ICD-10-CM

## 2012-11-19 DIAGNOSIS — D75839 Thrombocytosis, unspecified: Secondary | ICD-10-CM

## 2012-11-19 DIAGNOSIS — D45 Polycythemia vera: Secondary | ICD-10-CM

## 2012-11-19 LAB — CBC WITH DIFFERENTIAL/PLATELET
BASO%: 0.4 % (ref 0.0–2.0)
Basophils Absolute: 0 10*3/uL (ref 0.0–0.1)
EOS%: 2.2 % (ref 0.0–7.0)
Eosinophils Absolute: 0.2 10*3/uL (ref 0.0–0.5)
HCT: 40.4 % (ref 34.8–46.6)
HGB: 13 g/dL (ref 11.6–15.9)
LYMPH%: 35.2 % (ref 14.0–49.7)
MCH: 29.8 pg (ref 25.1–34.0)
MCHC: 32.2 g/dL (ref 31.5–36.0)
MCV: 92.7 fL (ref 79.5–101.0)
MONO#: 0.5 10*3/uL (ref 0.1–0.9)
MONO%: 7.2 % (ref 0.0–14.0)
NEUT#: 3.8 10*3/uL (ref 1.5–6.5)
NEUT%: 55 % (ref 38.4–76.8)
Platelets: 356 10*3/uL (ref 145–400)
RBC: 4.36 10*6/uL (ref 3.70–5.45)
RDW: 14 % (ref 11.2–14.5)
WBC: 6.9 10*3/uL (ref 3.9–10.3)
lymph#: 2.4 10*3/uL (ref 0.9–3.3)
nRBC: 0 % (ref 0–0)

## 2012-11-19 NOTE — Telephone Encounter (Signed)
Message copied by Reesa Chew on Wed Nov 19, 2012  3:50 PM ------      Message from: Su Hilt C      Created: Wed Nov 19, 2012  3:21 PM                   ----- Message -----         From: Myrtis Ser, NP         Sent: 11/19/2012   2:44 PM           To: Marlowe Aschoff, RN            Please call pt. CBC is normal. No change in the dose of Hydrea. ------

## 2012-11-19 NOTE — Telephone Encounter (Signed)
Spoke with Carolyn Figueroa, her mom's labs are normal and she is to continue on same dose of hydrea. Synetta Fail verbalized understanding.

## 2013-01-09 ENCOUNTER — Telehealth: Payer: Self-pay | Admitting: Hematology and Oncology

## 2013-01-09 NOTE — Telephone Encounter (Signed)
lmonvm for pt re appt time change on 10/22 for lb/fu @ 2:45pm. Schedule mailed.

## 2013-01-13 ENCOUNTER — Other Ambulatory Visit: Payer: Self-pay | Admitting: Hematology and Oncology

## 2013-01-13 ENCOUNTER — Telehealth: Payer: Self-pay | Admitting: *Deleted

## 2013-01-13 DIAGNOSIS — D45 Polycythemia vera: Secondary | ICD-10-CM

## 2013-01-13 NOTE — Telephone Encounter (Signed)
Daughter left VM to clarify pt's appt times for tomorrow.  Called back and left VM informing of lab at 2:45 pm and office visit at 3:15 pm.  Asked her to call back if any further questions.

## 2013-01-14 ENCOUNTER — Encounter: Payer: Self-pay | Admitting: Hematology and Oncology

## 2013-01-14 ENCOUNTER — Telehealth: Payer: Self-pay | Admitting: Hematology and Oncology

## 2013-01-14 ENCOUNTER — Other Ambulatory Visit (HOSPITAL_BASED_OUTPATIENT_CLINIC_OR_DEPARTMENT_OTHER): Payer: Medicare Other | Admitting: Lab

## 2013-01-14 ENCOUNTER — Ambulatory Visit (HOSPITAL_BASED_OUTPATIENT_CLINIC_OR_DEPARTMENT_OTHER): Payer: Medicare Other | Admitting: Hematology and Oncology

## 2013-01-14 VITALS — BP 148/94 | HR 88 | Temp 97.8°F | Resp 18 | Ht 62.0 in | Wt 138.4 lb

## 2013-01-14 DIAGNOSIS — R944 Abnormal results of kidney function studies: Secondary | ICD-10-CM

## 2013-01-14 DIAGNOSIS — Z23 Encounter for immunization: Secondary | ICD-10-CM

## 2013-01-14 DIAGNOSIS — I10 Essential (primary) hypertension: Secondary | ICD-10-CM

## 2013-01-14 DIAGNOSIS — D47Z9 Other specified neoplasms of uncertain behavior of lymphoid, hematopoietic and related tissue: Secondary | ICD-10-CM

## 2013-01-14 DIAGNOSIS — D45 Polycythemia vera: Secondary | ICD-10-CM

## 2013-01-14 HISTORY — DX: Encounter for immunization: Z23

## 2013-01-14 LAB — CBC WITH DIFFERENTIAL/PLATELET
BASO%: 0.7 % (ref 0.0–2.0)
Basophils Absolute: 0 10*3/uL (ref 0.0–0.1)
EOS%: 2.6 % (ref 0.0–7.0)
Eosinophils Absolute: 0.2 10*3/uL (ref 0.0–0.5)
HCT: 41 % (ref 34.8–46.6)
HGB: 13.2 g/dL (ref 11.6–15.9)
LYMPH%: 33.1 % (ref 14.0–49.7)
MCH: 29 pg (ref 25.1–34.0)
MCHC: 32.2 g/dL (ref 31.5–36.0)
MCV: 90 fL (ref 79.5–101.0)
MONO#: 0.6 10*3/uL (ref 0.1–0.9)
MONO%: 8.2 % (ref 0.0–14.0)
NEUT#: 4.2 10*3/uL (ref 1.5–6.5)
NEUT%: 55.4 % (ref 38.4–76.8)
Platelets: 428 10*3/uL — ABNORMAL HIGH (ref 145–400)
RBC: 4.55 10*6/uL (ref 3.70–5.45)
RDW: 14.7 % — ABNORMAL HIGH (ref 11.2–14.5)
WBC: 7.5 10*3/uL (ref 3.9–10.3)
lymph#: 2.5 10*3/uL (ref 0.9–3.3)

## 2013-01-14 LAB — COMPREHENSIVE METABOLIC PANEL (CC13)
ALT: 12 U/L (ref 0–55)
AST: 22 U/L (ref 5–34)
Albumin: 3.7 g/dL (ref 3.5–5.0)
Alkaline Phosphatase: 70 U/L (ref 40–150)
Anion Gap: 9 mEq/L (ref 3–11)
BUN: 29.3 mg/dL — ABNORMAL HIGH (ref 7.0–26.0)
CO2: 28 mEq/L (ref 22–29)
Calcium: 9.5 mg/dL (ref 8.4–10.4)
Chloride: 102 mEq/L (ref 98–109)
Creatinine: 1.1 mg/dL (ref 0.6–1.1)
Glucose: 96 mg/dl (ref 70–140)
Potassium: 3.7 mEq/L (ref 3.5–5.1)
Sodium: 140 mEq/L (ref 136–145)
Total Bilirubin: 0.69 mg/dL (ref 0.20–1.20)
Total Protein: 7.3 g/dL (ref 6.4–8.3)

## 2013-01-14 MED ORDER — INFLUENZA VAC SPLIT QUAD 0.5 ML IM SUSP
0.5000 mL | INTRAMUSCULAR | Status: AC
  Administered 2013-01-14: 0.5 mL via INTRAMUSCULAR
  Filled 2013-01-14: qty 0.5

## 2013-01-14 MED ORDER — HYDROXYUREA 500 MG PO CAPS: 500.0000 mg | ORAL_CAPSULE | Freq: Every day | ORAL | Status: DC

## 2013-01-14 NOTE — Progress Notes (Signed)
[facility] OFFICE PROGRESS NOTE  Patient Care Team: Aura Dials, MD as PCP - General (Family Medicine) Aura Dials, MD as Attending Physician (Family Medicine) L. 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 The patient never had bone marrow aspirate and biopsy. She was started on hydroxyurea and dose was adjusted due to hair thinning and fatigue  INTERVAL HISTORY: Carolyn Figueroa 66 y.o. female returns for further followup. She's doing well. She still has some fatigue. Denies any recent infection. She denies any recent fever, chills, night sweats or abnormal weight loss No recent diagnosis of blood clots.  I have reviewed the past medical history, past surgical history, social history and family history with the patient and they are unchanged from previous note.  ALLERGIES:  has No Known Allergies.  MEDICATIONS:  Current Outpatient Prescriptions  Medication Sig Dispense Refill  . aspirin 81 MG tablet Take 81 mg by mouth daily.      . Biotin 1 MG CAPS Take 1 tablet by mouth daily.      Marland Kitchen conjugated estrogens (PREMARIN) vaginal cream Place 0.5 g vaginally 3 (three) times a week.      . hydrochlorothiazide (HYDRODIURIL) 25 MG tablet Take 25 mg by mouth daily.      . hydroxyurea (HYDREA) 500 MG capsule Take 1 capsule (500 mg total) by mouth daily. May take with food to minimize GI side effects.  30 capsule  5  . OVER THE COUNTER MEDICATION Hair Skin and Nails supplement w/ 5,000 mcg of Biotin and multiple vitamin.      Marland Kitchen sertraline (ZOLOFT) 25 MG tablet Take 50 mg  by mouth daily.        Current Facility-Administered Medications  Medication Dose Route Frequency Provider Last Rate Last Dose  . [START ON 01/15/2013] influenza vac split quadrivalent PF (FLUARIX) injection 0.5 mL  0.5 mL Intramuscular Tomorrow-1000 Avaeh Ewer, MD        REVIEW OF SYSTEMS:   Constitutional: Denies fevers, chills or abnormal weight loss Eyes: Denies blurriness of vision Ears, nose, mouth, throat, and face: Denies mucositis or sore throat Respiratory: Denies cough, dyspnea or wheezes Cardiovascular: Denies palpitation, chest discomfort or lower extremity swelling Gastrointestinal:  Denies nausea, heartburn or change in bowel habits Skin: Denies abnormal skin rashes Lymphatics: Denies new lymphadenopathy or easy bruising Neurological:Denies numbness, tingling or new weaknesses Behavioral/Psych: Mood is stable, no new changes  All other systems were reviewed with the patient and are negative.  PHYSICAL EXAMINATION: ECOG PERFORMANCE STATUS: 0 - Asymptomatic  Filed Vitals:   01/14/13 1508  BP: 148/94  Pulse: 88  Temp: 97.8 F (36.6 C)  Resp: 18   Filed Weights   01/14/13 1508  Weight: 138 lb 6.4 oz (62.778 kg)    GENERAL:alert, no distress and comfortable SKIN: skin color, texture, turgor are normal, no rashes or significant lesions EYES: normal, Conjunctiva are pink and non-injected, sclera clear OROPHARYNX:no exudate, no erythema and lips, buccal mucosa, and tongue normal  NECK: supple, thyroid normal size, non-tender, without nodularity LYMPH:  no palpable lymphadenopathy in the cervical, axillary or inguinal LUNGS: clear to auscultation and percussion with normal breathing effort HEART: regular rate &  rhythm and no murmurs and no lower extremity edema ABDOMEN:abdomen soft, non-tender and normal bowel sounds. No palpable splenomegaly Musculoskeletal:no cyanosis of digits and no clubbing  NEURO: alert & oriented x 3 with fluent speech, no focal  motor/sensory deficits  LABORATORY DATA:  I have reviewed the data as listed    Component Value Date/Time   NA 140 01/14/2013 1446   NA 137 11/14/2011 1345   K 3.7 01/14/2013 1446   K 3.9 11/14/2011 1345   CL 102 07/30/2012 1419   CL 99 11/14/2011 1345   CO2 28 01/14/2013 1446   CO2 30 11/14/2011 1345   GLUCOSE 96 01/14/2013 1446   GLUCOSE 102* 07/30/2012 1419   GLUCOSE 153* 11/14/2011 1345   BUN 29.3* 01/14/2013 1446   BUN 32* 11/14/2011 1345   CREATININE 1.1 01/14/2013 1446   CREATININE 1.17* 11/14/2011 1345   CALCIUM 9.5 01/14/2013 1446   CALCIUM 9.1 11/14/2011 1345   PROT 7.3 01/14/2013 1446   PROT 6.6 11/14/2011 1345   ALBUMIN 3.7 01/14/2013 1446   ALBUMIN 4.0 11/14/2011 1345   AST 22 01/14/2013 1446   AST 15 11/14/2011 1345   ALT 12 01/14/2013 1446   ALT 9 11/14/2011 1345   ALKPHOS 70 01/14/2013 1446   ALKPHOS 37* 11/14/2011 1345   BILITOT 0.69 01/14/2013 1446   BILITOT 1.0 11/14/2011 1345   GFRNONAA 58* 11/22/2009 1855   GFRAA  Value: >60        The eGFR has been calculated using the MDRD equation. This calculation has not been validated in all clinical situations. eGFR's persistently <60 mL/min signify possible Chronic Kidney Disease. 11/22/2009 1855    No results found for this basename: SPEP, UPEP,  kappa and lambda light chains    Lab Results  Component Value Date   WBC 7.5 01/14/2013   NEUTROABS 4.2 01/14/2013   HGB 13.2 01/14/2013   HCT 41.0 01/14/2013   MCV 90.0 01/14/2013   PLT 428* 01/14/2013      Chemistry      Component Value Date/Time   NA 140 01/14/2013 1446   NA 137 11/14/2011 1345   K 3.7 01/14/2013 1446   K 3.9 11/14/2011 1345   CL 102 07/30/2012 1419   CL 99 11/14/2011 1345   CO2 28 01/14/2013 1446   CO2 30 11/14/2011 1345   BUN 29.3* 01/14/2013 1446   BUN 32* 11/14/2011 1345   CREATININE 1.1 01/14/2013 1446   CREATININE 1.17* 11/14/2011 1345      Component Value Date/Time   CALCIUM 9.5 01/14/2013 1446   CALCIUM 9.1 11/14/2011 1345   ALKPHOS 70  01/14/2013 1446   ALKPHOS 37* 11/14/2011 1345   AST 22 01/14/2013 1446   AST 15 11/14/2011 1345   ALT 12 01/14/2013 1446   ALT 9 11/14/2011 1345   BILITOT 0.69 01/14/2013 1446   BILITOT 1.0 11/14/2011 1345     ASSESSMENT:  JAK 2 positive myeloproliferative disorder  PLAN:  #1 myeloproliferative disorder She never have a bone marrow biopsy. 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 Her CBC is responding well with hydroxyurea 1 tablet a day. We'll continue the same. She will continue aspirin 81 mg per day. I will see her back in 3 months with repeat blood work, history, and physical examination #2 hypertension She was on lisinopril however due to elevated creatinine was changed. The kidney function is doing well now. I would defer to her primary care provider to monitor her blood pressure #3 preventive care We  discussed the importance of preventive care and reviewed the vaccination programs. She does not have any prior allergic reactions to influenza vaccination. She agrees to proceed with influenza vaccination today and we will administer it today at the clinic.  Orders Placed This Encounter  Procedures  . CBC with Differential    Standing Status: Future     Number of Occurrences:      Standing Expiration Date: 10/06/2013  . Comprehensive metabolic panel    Standing Status: Future     Number of Occurrences:      Standing Expiration Date: 01/14/2014  . Lactate dehydrogenase    Standing Status: Future     Number of Occurrences:      Standing Expiration Date: 01/14/2014   All questions were answered. The patient knows to call the clinic with any problems, questions or concerns. No barriers to learning was detected. I spent 15 minutes counseling the patient face to face. The total time spent in the appointment was 20 minutes and more than 50% was on counseling and review of test results     [facility], Liridona Mashaw, MD 01/14/2013 3:34 PM

## 2013-01-14 NOTE — Telephone Encounter (Signed)
Gave pt appt for lab and Md for January 2015 °

## 2013-02-09 ENCOUNTER — Telehealth: Payer: Self-pay | Admitting: *Deleted

## 2013-02-09 NOTE — Telephone Encounter (Signed)
Left VM for daughter informing of Dr. Maxine Glenn reply below.  Instructed her to call back for any further questions or clarification.

## 2013-02-09 NOTE — Telephone Encounter (Signed)
It is not recommended but if she has problem tolerating higher dose we can reduce to every other day 500 mg Her last platelet count was a bit high over 400, best to stick to 500 mg daily

## 2013-02-09 NOTE — Telephone Encounter (Signed)
Daughter asking to clarify dose of Hydroxyurea.  States pt on 500 mg daily currently, but was on 300 mg in the past.  Wants to make sure Dr. Bertis Ruddy wants her to be on 500 mg.  If this is the case, she asks if dose can be lowered a little to 400 mg?  States pt felt better when she was on 300 mg.

## 2013-04-15 ENCOUNTER — Other Ambulatory Visit (HOSPITAL_BASED_OUTPATIENT_CLINIC_OR_DEPARTMENT_OTHER): Payer: Medicare Other

## 2013-04-15 ENCOUNTER — Ambulatory Visit (HOSPITAL_BASED_OUTPATIENT_CLINIC_OR_DEPARTMENT_OTHER): Payer: Medicare Other | Admitting: Hematology and Oncology

## 2013-04-15 VITALS — BP 156/86 | HR 104 | Temp 99.8°F | Resp 18 | Ht 62.0 in | Wt 140.7 lb

## 2013-04-15 DIAGNOSIS — D45 Polycythemia vera: Secondary | ICD-10-CM

## 2013-04-15 DIAGNOSIS — Z23 Encounter for immunization: Secondary | ICD-10-CM

## 2013-04-15 DIAGNOSIS — D473 Essential (hemorrhagic) thrombocythemia: Secondary | ICD-10-CM

## 2013-04-15 DIAGNOSIS — I10 Essential (primary) hypertension: Secondary | ICD-10-CM

## 2013-04-15 DIAGNOSIS — D47Z9 Other specified neoplasms of uncertain behavior of lymphoid, hematopoietic and related tissue: Secondary | ICD-10-CM

## 2013-04-15 DIAGNOSIS — H109 Unspecified conjunctivitis: Secondary | ICD-10-CM

## 2013-04-15 LAB — COMPREHENSIVE METABOLIC PANEL (CC13)
ALT: 9 U/L (ref 0–55)
AST: 21 U/L (ref 5–34)
Albumin: 3.5 g/dL (ref 3.5–5.0)
Alkaline Phosphatase: 71 U/L (ref 40–150)
Anion Gap: 8 mEq/L (ref 3–11)
BUN: 25.9 mg/dL (ref 7.0–26.0)
CO2: 28 mEq/L (ref 22–29)
Calcium: 8.9 mg/dL (ref 8.4–10.4)
Chloride: 101 mEq/L (ref 98–109)
Creatinine: 1 mg/dL (ref 0.6–1.1)
Glucose: 87 mg/dl (ref 70–140)
Potassium: 4 mEq/L (ref 3.5–5.1)
Sodium: 136 mEq/L (ref 136–145)
Total Bilirubin: 0.48 mg/dL (ref 0.20–1.20)
Total Protein: 7.1 g/dL (ref 6.4–8.3)

## 2013-04-15 LAB — CBC WITH DIFFERENTIAL/PLATELET
BASO%: 0.4 % (ref 0.0–2.0)
Basophils Absolute: 0 10*3/uL (ref 0.0–0.1)
EOS%: 3.5 % (ref 0.0–7.0)
Eosinophils Absolute: 0.3 10*3/uL (ref 0.0–0.5)
HCT: 41.2 % (ref 34.8–46.6)
HGB: 13.1 g/dL (ref 11.6–15.9)
LYMPH%: 18.3 % (ref 14.0–49.7)
MCH: 27.8 pg (ref 25.1–34.0)
MCHC: 31.8 g/dL (ref 31.5–36.0)
MCV: 87.3 fL (ref 79.5–101.0)
MONO#: 0.6 10*3/uL (ref 0.1–0.9)
MONO%: 7.2 % (ref 0.0–14.0)
NEUT#: 5.5 10*3/uL (ref 1.5–6.5)
NEUT%: 70.6 % (ref 38.4–76.8)
Platelets: 446 10*3/uL — ABNORMAL HIGH (ref 145–400)
RBC: 4.72 10*6/uL (ref 3.70–5.45)
RDW: 15.2 % — ABNORMAL HIGH (ref 11.2–14.5)
WBC: 7.8 10*3/uL (ref 3.9–10.3)
lymph#: 1.4 10*3/uL (ref 0.9–3.3)

## 2013-04-15 LAB — LACTATE DEHYDROGENASE (CC13): LDH: 229 U/L (ref 125–245)

## 2013-04-15 NOTE — Progress Notes (Signed)
[facility] OFFICE PROGRESS NOTE  Patient Care Team: Phineas Inches, MD as PCP - General (Family Medicine) Phineas Inches, MD as Attending Physician (Family Medicine) L. 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 The patient never had bone marrow aspirate and biopsy. She was started on hydroxyurea and dose was adjusted due to hair thinning and fatigue INTERVAL HISTORY: Carolyn Figueroa 68 y.o. female returns for further followup. She returns to [place] recently. She thought she may have an insect bite near her left eyelid. Recently, she developed significant conjunctivitis involving both eyes, the left greater than the right. She denies any blurriness of vision.   I have reviewed the past medical history, past surgical history, social history and family history with the patient and they are unchanged from previous note.  ALLERGIES:  has No Known Allergies.  MEDICATIONS:  Current Outpatient Prescriptions  Medication Sig Dispense Refill  . aspirin 81 MG tablet Take 81 mg by mouth daily.      . Biotin 1 MG CAPS Take 1 tablet by mouth daily.      Marland Kitchen conjugated estrogens (PREMARIN) vaginal cream Place 0.5 g vaginally 3 (three) times a week.      . fesoterodine (TOVIAZ) 8 MG TB24 tablet Take 8 mg by mouth at bedtime.      . hydrochlorothiazide (HYDRODIURIL) 25 MG tablet Take 25 mg by mouth daily.      . hydroxyurea (HYDREA) 500 MG capsule Take 1 capsule (500 mg total) by mouth daily. May take with food to minimize GI side effects.  30 capsule  5   . OVER THE COUNTER MEDICATION Hair Skin and Nails supplement w/ 5,000 mcg of Biotin and multiple vitamin.       No current facility-administered medications for this visit.    REVIEW OF SYSTEMS:   Constitutional: Denies fevers, chills or abnormal weight loss Ears, nose, mouth, throat, and face: Denies mucositis or sore throat Respiratory: Denies cough, dyspnea or wheezes Cardiovascular: Denies palpitation, chest discomfort or lower extremity swelling Gastrointestinal:  Denies nausea, heartburn or change in bowel habits Skin: Denies abnormal skin rashes Lymphatics: Denies new lymphadenopathy or easy bruising Neurological:Denies numbness, tingling or new weaknesses Behavioral/Psych: Mood is stable, no new changes  All other systems were reviewed with the patient and are negative.  PHYSICAL EXAMINATION: ECOG PERFORMANCE STATUS: 1 - Symptomatic but completely ambulatory  Filed Vitals:   04/15/13 1456  BP: 156/86  Pulse: 104  Temp: 99.8 F (37.7 C)  Resp: 18   Filed Weights   04/15/13 1456  Weight: 140 lb 11.2 oz (63.821 kg)    GENERAL:alert, no distress and comfortable SKIN: skin color, texture, turgor are normal, no rashes or significant lesions EYES: I noted significant conjunctivitis involving both eyes, worse on the left eye OROPHARYNX:no exudate, no erythema and lips, buccal mucosa, and tongue normal  NECK: supple, thyroid normal size, non-tender, without nodularity LYMPH:  no palpable lymphadenopathy in the cervical, axillary or inguinal LUNGS: clear to auscultation and percussion with normal breathing effort HEART: regular rate & rhythm and no murmurs and no lower extremity edema ABDOMEN:abdomen soft, non-tender and normal bowel sounds Musculoskeletal:no cyanosis  of digits and no clubbing  NEURO: alert & oriented x 3 with fluent speech, no focal motor/sensory deficits  LABORATORY DATA:  I have reviewed the data as listed    Component Value Date/Time   NA 136  04/15/2013 1437   NA 137 11/14/2011 1345   K 4.0 04/15/2013 1437   K 3.9 11/14/2011 1345   CL 102 07/30/2012 1419   CL 99 11/14/2011 1345   CO2 28 04/15/2013 1437   CO2 30 11/14/2011 1345   GLUCOSE 87 04/15/2013 1437   GLUCOSE 102* 07/30/2012 1419   GLUCOSE 153* 11/14/2011 1345   BUN 25.9 04/15/2013 1437   BUN 32* 11/14/2011 1345   CREATININE 1.0 04/15/2013 1437   CREATININE 1.17* 11/14/2011 1345   CALCIUM 8.9 04/15/2013 1437   CALCIUM 9.1 11/14/2011 1345   PROT 7.1 04/15/2013 1437   PROT 6.6 11/14/2011 1345   ALBUMIN 3.5 04/15/2013 1437   ALBUMIN 4.0 11/14/2011 1345   AST 21 04/15/2013 1437   AST 15 11/14/2011 1345   ALT 9 04/15/2013 1437   ALT 9 11/14/2011 1345   ALKPHOS 71 04/15/2013 1437   ALKPHOS 37* 11/14/2011 1345   BILITOT 0.48 04/15/2013 1437   BILITOT 1.0 11/14/2011 1345   GFRNONAA 58* 11/22/2009 1855   GFRAA  Value: >60        The eGFR has been calculated using the MDRD equation. This calculation has not been validated in all clinical situations. eGFR's persistently <60 mL/min signify possible Chronic Kidney Disease. 11/22/2009 1855    No results found for this basename: SPEP,  UPEP,   kappa and lambda light chains    Lab Results  Component Value Date   WBC 7.8 04/15/2013   NEUTROABS 5.5 04/15/2013   HGB 13.1 04/15/2013   HCT 41.2 04/15/2013   MCV 87.3 04/15/2013   PLT 446* 04/15/2013      Chemistry      Component Value Date/Time   NA 136 04/15/2013 1437   NA 137 11/14/2011 1345   K 4.0 04/15/2013 1437   K 3.9 11/14/2011 1345   CL 102 07/30/2012 1419   CL 99 11/14/2011 1345   CO2 28 04/15/2013 1437   CO2 30 11/14/2011 1345   BUN 25.9 04/15/2013 1437   BUN 32* 11/14/2011 1345   CREATININE 1.0 04/15/2013 1437   CREATININE 1.17* 11/14/2011 1345      Component Value Date/Time   CALCIUM 8.9 04/15/2013 1437   CALCIUM 9.1 11/14/2011 1345   ALKPHOS 71 04/15/2013 1437   ALKPHOS 37* 11/14/2011 1345   AST 21 04/15/2013 1437   AST 15 11/14/2011 1345   ALT 9 04/15/2013 1437   ALT 9 11/14/2011 1345   BILITOT  0.48 04/15/2013 1437   BILITOT 1.0 11/14/2011 1345     ASSESSMENT & PLAN:  #1 myeloproliferative disorder She never have a bone marrow biopsy. 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 Her CBC is responding well with hydroxyurea 1 tablet a day. We'll continue the same. She will continue aspirin 81 mg per day. I will see her back in 4 months with repeat blood work, history, and physical examination #2 hypertension She was on lisinopril however due to elevated creatinine was changed. The kidney function is doing well now. I would defer to her primary care provider to monitor her blood pressure #3 conjunctivitis She has an appointment to see an ophthalmologist #4 mild thrombocytosis This is due to problem #1. It is only mild I will observe Orders Placed This  Encounter  Procedures  . Comprehensive metabolic panel    Standing Status: Future     Number of Occurrences:      Standing Expiration Date: 04/15/2014  . CBC with Differential    Standing Status: Future     Number of Occurrences:      Standing Expiration Date: 04/15/2014   All questions were answered. The patient knows to call the clinic with any problems, questions or concerns. No barriers to learning was detected. I spent 15 minutes counseling the patient face to face. The total time spent in the appointment was 20 minutes and more than 50% was on counseling and review of test results     [facility], [place], MD 04/15/2013 7:57 PM

## 2013-04-16 ENCOUNTER — Telehealth: Payer: Self-pay | Admitting: Hematology and Oncology

## 2013-04-16 NOTE — Telephone Encounter (Signed)
lmonvm advising the pt of his lab and md appt in may.

## 2013-04-27 ENCOUNTER — Other Ambulatory Visit: Payer: Self-pay

## 2013-04-27 DIAGNOSIS — Z1231 Encounter for screening mammogram for malignant neoplasm of breast: Secondary | ICD-10-CM

## 2013-05-14 ENCOUNTER — Ambulatory Visit: Payer: Medicare Other

## 2013-05-22 ENCOUNTER — Ambulatory Visit: Payer: Medicare Other

## 2013-06-12 ENCOUNTER — Ambulatory Visit
Admission: RE | Admit: 2013-06-12 | Discharge: 2013-06-12 | Disposition: A | Discharge status: Home / Self Care | Payer: Medicare Other | Source: Ambulatory Visit

## 2013-06-12 DIAGNOSIS — Z1231 Encounter for screening mammogram for malignant neoplasm of breast: Secondary | ICD-10-CM

## 2013-08-20 ENCOUNTER — Other Ambulatory Visit (HOSPITAL_BASED_OUTPATIENT_CLINIC_OR_DEPARTMENT_OTHER): Payer: Medicare Other

## 2013-08-20 ENCOUNTER — Ambulatory Visit (HOSPITAL_BASED_OUTPATIENT_CLINIC_OR_DEPARTMENT_OTHER): Payer: Medicare Other | Admitting: Hematology and Oncology

## 2013-08-20 ENCOUNTER — Encounter: Payer: Self-pay | Admitting: Hematology and Oncology

## 2013-08-20 VITALS — BP 154/88 | HR 91 | Temp 98.2°F | Resp 18 | Ht 62.0 in | Wt 139.7 lb

## 2013-08-20 DIAGNOSIS — D45 Polycythemia vera: Secondary | ICD-10-CM

## 2013-08-20 DIAGNOSIS — D473 Essential (hemorrhagic) thrombocythemia: Secondary | ICD-10-CM

## 2013-08-20 DIAGNOSIS — D75839 Thrombocytosis, unspecified: Secondary | ICD-10-CM

## 2013-08-20 DIAGNOSIS — R799 Abnormal finding of blood chemistry, unspecified: Secondary | ICD-10-CM

## 2013-08-20 DIAGNOSIS — D751 Secondary polycythemia: Secondary | ICD-10-CM

## 2013-08-20 DIAGNOSIS — M79609 Pain in unspecified limb: Secondary | ICD-10-CM

## 2013-08-20 DIAGNOSIS — M79606 Pain in leg, unspecified: Secondary | ICD-10-CM

## 2013-08-20 DIAGNOSIS — D47Z9 Other specified neoplasms of uncertain behavior of lymphoid, hematopoietic and related tissue: Secondary | ICD-10-CM

## 2013-08-20 DIAGNOSIS — Z23 Encounter for immunization: Secondary | ICD-10-CM

## 2013-08-20 LAB — CBC WITH DIFFERENTIAL/PLATELET
BASO%: 1.6 % (ref 0.0–2.0)
Basophils Absolute: 0.1 10*3/uL (ref 0.0–0.1)
EOS%: 3.4 % (ref 0.0–7.0)
Eosinophils Absolute: 0.2 10*3/uL (ref 0.0–0.5)
HCT: 49.3 % — ABNORMAL HIGH (ref 34.8–46.6)
HGB: 15.5 g/dL (ref 11.6–15.9)
LYMPH%: 27.7 % (ref 14.0–49.7)
MCH: 28.1 pg (ref 25.1–34.0)
MCHC: 31.4 g/dL — ABNORMAL LOW (ref 31.5–36.0)
MCV: 89.5 fL (ref 79.5–101.0)
MONO#: 0.4 10*3/uL (ref 0.1–0.9)
MONO%: 6.7 % (ref 0.0–14.0)
NEUT#: 3.7 10*3/uL (ref 1.5–6.5)
NEUT%: 60.6 % (ref 38.4–76.8)
Platelets: 475 10*3/uL — ABNORMAL HIGH (ref 145–400)
RBC: 5.51 10*6/uL — ABNORMAL HIGH (ref 3.70–5.45)
RDW: 15.6 % — ABNORMAL HIGH (ref 11.2–14.5)
WBC: 6.2 10*3/uL (ref 3.9–10.3)
lymph#: 1.7 10*3/uL (ref 0.9–3.3)

## 2013-08-20 LAB — COMPREHENSIVE METABOLIC PANEL (CC13)
ALT: 9 U/L (ref 0–55)
AST: 19 U/L (ref 5–34)
Albumin: 4 g/dL (ref 3.5–5.0)
Alkaline Phosphatase: 80 U/L (ref 40–150)
Anion Gap: 11 mEq/L (ref 3–11)
BUN: 30.2 mg/dL — ABNORMAL HIGH (ref 7.0–26.0)
CO2: 26 mEq/L (ref 22–29)
Calcium: 9.7 mg/dL (ref 8.4–10.4)
Chloride: 103 mEq/L (ref 98–109)
Creatinine: 1.1 mg/dL (ref 0.6–1.1)
Glucose: 81 mg/dl (ref 70–140)
Potassium: 4 mEq/L (ref 3.5–5.1)
Sodium: 140 mEq/L (ref 136–145)
Total Bilirubin: 1.06 mg/dL (ref 0.20–1.20)
Total Protein: 7.5 g/dL (ref 6.4–8.3)

## 2013-08-20 MED ORDER — HYDROXYUREA 500 MG PO CAPS: 500.0000 mg | ORAL_CAPSULE | Freq: Every day | ORAL | Status: DC

## 2013-08-20 NOTE — Assessment & Plan Note (Signed)
Continue hydroxyurea with aspirin.

## 2013-08-20 NOTE — Assessment & Plan Note (Signed)
I recommend a trial of vitamin D supplements.

## 2013-08-20 NOTE — Assessment & Plan Note (Addendum)
She is doing well. I recommend continue hydroxyurea 500 milligrams daily with aspirin 81 mg daily. Since the blood well is stable, I recommend recheck with history, physical examination and blood work in 6 months.

## 2013-08-20 NOTE — Assessment & Plan Note (Signed)
There is no need to do phlebotomy.

## 2013-08-20 NOTE — Progress Notes (Signed)
[facility] OFFICE PROGRESS NOTE  Patient Care Team: Phineas Inches, MD as PCP - General (Family Medicine) Phineas Inches, MD as Attending Physician (Family Medicine) Christy Sartorius, MD as Referring Physician (Urology) Avel Sensor, MD as Attending Physician (Obstetrics and Gynecology) Tammy Arn Medal, MD as Referring Physician (Family Medicine)  SUMMARY OF ONCOLOGIC HISTORY: This patient was discovered to have myeloproliferative disorder after routine blood work revealed elevated hemoglobin and platelet. 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 The patient never had bone marrow aspirate and biopsy. She was started on hydroxyurea and dose was adjusted due to hair thinning and fatigue  INTERVAL HISTORY: Please see below for problem oriented charting.  REVIEW OF SYSTEMS:   Constitutional: Denies fevers, chills or abnormal weight loss Eyes: Denies blurriness of vision Ears, nose, mouth, throat, and face: Denies mucositis or sore throat Respiratory: Denies cough, dyspnea or wheezes Cardiovascular: Denies palpitation, chest discomfort or lower extremity swelling Gastrointestinal:  Denies nausea, heartburn or change in bowel habits Skin: Denies abnormal skin rashes Lymphatics: Denies new lymphadenopathy or easy bruising Neurological:Denies numbness, tingling or new weaknesses Behavioral/Psych: Mood is stable, no new changes  All other systems were reviewed with the patient and are negative.  I have reviewed the past medical history, past surgical history, social history and family history with the patient and they are unchanged from previous note.  ALLERGIES:  has No Known Allergies.  MEDICATIONS:  Current Outpatient Prescriptions  Medication Sig Dispense Refill  . aspirin 81 MG tablet Take 81 mg by mouth daily.      . Biotin 1 MG CAPS Take 1 tablet by mouth daily.      Marland Kitchen conjugated estrogens (PREMARIN) vaginal cream Place 0.5 g vaginally 3  (three) times a week.      . fesoterodine (TOVIAZ) 8 MG TB24 tablet Take 8 mg by mouth at bedtime.      . hydrochlorothiazide (HYDRODIURIL) 25 MG tablet Take 25 mg by mouth daily.      . hydroxyurea (HYDREA) 500 MG capsule Take 1 capsule (500 mg total) by mouth daily. May take with food to minimize GI side effects.  30 capsule  5  . OVER THE COUNTER MEDICATION Hair Skin and Nails supplement w/ 5,000 mcg of Biotin and multiple vitamin.       No current facility-administered medications for this visit.    PHYSICAL EXAMINATION: ECOG PERFORMANCE STATUS: 0 - Asymptomatic  Filed Vitals:   08/20/13 1308  BP: 154/88  Pulse: 91  Temp: 98.2 F (36.8 C)  Resp: 18   Filed Weights   08/20/13 1308  Weight: 139 lb 11.2 oz (63.368 kg)    GENERAL:alert, no distress and comfortable SKIN: skin color, texture, turgor are normal, no rashes or significant lesions EYES: normal, Conjunctiva are pink and non-injected, sclera clear Musculoskeletal:no cyanosis of digits and no clubbing  NEURO: alert & oriented x 3 with fluent speech, no focal motor/sensory deficits  LABORATORY DATA:  I have reviewed the data as listed    Component Value Date/Time   NA 140 08/20/2013 1247   NA 137 11/14/2011 1345   K 4.0 08/20/2013 1247   K 3.9 11/14/2011 1345   CL 102 07/30/2012 1419   CL 99 11/14/2011 1345   CO2 26 08/20/2013 1247   CO2 30 11/14/2011 1345   GLUCOSE 81 08/20/2013 1247   GLUCOSE 102* 07/30/2012 1419   GLUCOSE 153* 11/14/2011 1345   BUN 30.2* 08/20/2013 1247   BUN 32*  11/14/2011 1345   CREATININE 1.1 08/20/2013 1247   CREATININE 1.17* 11/14/2011 1345   CALCIUM 9.7 08/20/2013 1247   CALCIUM 9.1 11/14/2011 1345   PROT 7.5 08/20/2013 1247   PROT 6.6 11/14/2011 1345   ALBUMIN 4.0 08/20/2013 1247   ALBUMIN 4.0 11/14/2011 1345   AST 19 08/20/2013 1247   AST 15 11/14/2011 1345   ALT 9 08/20/2013 1247   ALT 9 11/14/2011 1345   ALKPHOS 80 08/20/2013 1247   ALKPHOS 37* 11/14/2011 1345   BILITOT 1.06 08/20/2013 1247    BILITOT 1.0 11/14/2011 1345   GFRNONAA 58* 11/22/2009 1855   GFRAA  Value: >60        The eGFR has been calculated using the MDRD equation. This calculation has not been validated in all clinical situations. eGFR's persistently <60 mL/min signify possible Chronic Kidney Disease. 11/22/2009 1855    No results found for this basename: SPEP,  UPEP,   kappa and lambda light chains    Lab Results  Component Value Date   WBC 6.2 08/20/2013   NEUTROABS 3.7 08/20/2013   HGB 15.5 08/20/2013   HCT 49.3* 08/20/2013   MCV 89.5 08/20/2013   PLT 475* 08/20/2013      Chemistry      Component Value Date/Time   NA 140 08/20/2013 1247   NA 137 11/14/2011 1345   K 4.0 08/20/2013 1247   K 3.9 11/14/2011 1345   CL 102 07/30/2012 1419   CL 99 11/14/2011 1345   CO2 26 08/20/2013 1247   CO2 30 11/14/2011 1345   BUN 30.2* 08/20/2013 1247   BUN 32* 11/14/2011 1345   CREATININE 1.1 08/20/2013 1247   CREATININE 1.17* 11/14/2011 1345      Component Value Date/Time   CALCIUM 9.7 08/20/2013 1247   CALCIUM 9.1 11/14/2011 1345   ALKPHOS 80 08/20/2013 1247   ALKPHOS 37* 11/14/2011 1345   AST 19 08/20/2013 1247   AST 15 11/14/2011 1345   ALT 9 08/20/2013 1247   ALT 9 11/14/2011 1345   BILITOT 1.06 08/20/2013 1247   BILITOT 1.0 11/14/2011 1345     ASSESSMENT & PLAN:  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 I recommend continue hydroxyurea 500 milligrams daily with aspirin 81 mg daily. Since the blood well is stable, I recommend recheck with history, physical examination and blood work in 6 months.  Polycythemia There is no need to do phlebotomy.  Elevated BUN I suspect that is an element of dehydration. I recommend increase oral fluid intake.  Thrombocytosis Continue hydroxyurea with aspirin.  Leg pain I recommend a trial of vitamin D supplements.    Orders Placed This Encounter  Procedures  . Comprehensive metabolic panel    Standing Status: Future     Number of Occurrences:      Standing Expiration Date: 08/20/2014   . CBC with Differential    Standing Status: Future     Number of Occurrences:      Standing Expiration Date: 08/20/2014   All questions were answered. The patient knows to call the clinic with any problems, questions or concerns. No barriers to learning was detected. I spent 15 minutes counseling the patient face to face. The total time spent in the appointment was 20 minutes and more than 50% was on counseling and review of test results     Heath Lark, MD 08/20/2013 3:13 PM

## 2013-08-20 NOTE — Assessment & Plan Note (Signed)
I suspect that is an element of dehydration. I recommend increase oral fluid intake.

## 2013-08-21 ENCOUNTER — Telehealth: Payer: Self-pay | Admitting: Hematology and Oncology

## 2013-08-21 NOTE — Telephone Encounter (Signed)
s.w. pt daughter per pt request and advised on Nov appt....ok and aware

## 2013-09-30 ENCOUNTER — Telehealth: Payer: Self-pay | Admitting: *Deleted

## 2013-09-30 ENCOUNTER — Other Ambulatory Visit: Payer: Self-pay | Admitting: Hematology and Oncology

## 2013-09-30 NOTE — Telephone Encounter (Addendum)
Shantay with Dr. Orlean Bradford called reporting patient's platelet count on 09-29-2013 = 627.  Faxed copy of results received and placed in provider's in-basket.

## 2013-09-30 NOTE — Telephone Encounter (Signed)
Message copied by Cathlean Cower on Wed Sep 30, 2013  4:43 PM ------      Message from: [facility], [place]      Created: Wed Sep 30, 2013  4:35 PM      Regarding: RE: recent abnormal labs       She can take both together at the same time, easier that way too      ----- Message -----         From: Cathlean Cower, RN         Sent: 09/30/2013   4:34 PM           To: Heath Lark, MD      Subject: RE: recent abnormal labs                                 Sorry,  Is that 2 tabs once daily or 1 tab twice daily? Thanks.       ----- Message -----         From: Heath Lark, MD         Sent: 09/30/2013   9:59 AM           To: Cathlean Cower, RN      Subject: recent abnormal labs                                     Please ask patient to increase hydroxyurea to 2 tabs on Mondays, Wed and Fri and take 1 tab the rest of the week.      I will place a POF to see her back in 6 weeks             ------

## 2013-09-30 NOTE — Telephone Encounter (Signed)
Message copied by Cathlean Cower on Wed Sep 30, 2013 10:32 AM ------      Message from: [facility], [place]: Wed Sep 30, 2013  9:59 AM      Regarding: recent abnormal labs       Please ask patient to increase hydroxyurea to 2 tabs on Mondays, Wed and Fri and take 1 tab the rest of the week.      I will place a POF to see her back in 6 weeks ------

## 2013-09-30 NOTE — Telephone Encounter (Signed)
Daughter called back and left VM to clarify if pt to take hydrea 2 tabs once daily or 1 tab twice daily?  Left dau VM informing pt can take both pills at same time on Mon, Wed and Frid,  Continue 1 tab the rest of the week.  Call back again if any questions.

## 2013-09-30 NOTE — Telephone Encounter (Signed)
Received CBC results from pt's PCP, Dr. Luciana Axe and they were reviewed by Dr. Alvy Bimler.  Left VM for daughter instructing for pt to increase Hydrea to two tablets on Mondays, Wednesdays and Fridays,  Continue once daily rest of week.  Return in 6 weeks for lab and office visit.  Expect a call from scheduler.  Please call nurse back if any questions or concerns.

## 2013-10-01 ENCOUNTER — Telehealth: Payer: Self-pay | Admitting: Hematology and Oncology

## 2013-10-01 NOTE — Telephone Encounter (Signed)
Per 07/08 POF, spk w/pt advised of apt for labs/ov for 08/17, pt confirmed, mailing out updated schedule to pt...Marland KitchenMarland KitchenKJ

## 2013-10-05 ENCOUNTER — Telehealth: Payer: Self-pay | Admitting: Hematology and Oncology

## 2013-10-05 NOTE — Telephone Encounter (Signed)
returned pt call adn r/s appt per pt request....pt ok and aware of new d.t °

## 2013-10-19 ENCOUNTER — Other Ambulatory Visit: Payer: Self-pay | Admitting: Hematology and Oncology

## 2013-10-19 ENCOUNTER — Telehealth: Payer: Self-pay | Admitting: *Deleted

## 2013-10-19 NOTE — Telephone Encounter (Signed)
Patient's daughter left a voice mail stating mother is still c/o dizziness, tired and lack of appetite. Hydrea was increased 3 weeks ago and she is wondering if it "just taking a while to work". States patient also restarted zoloft. Is also wondering if it could be depression,

## 2013-10-22 ENCOUNTER — Telehealth: Payer: Self-pay | Admitting: *Deleted

## 2013-10-22 NOTE — Telephone Encounter (Signed)
Daughter asks if Dr. Alvy Bimler has received any labs from pt's PCP yet?

## 2013-10-23 ENCOUNTER — Other Ambulatory Visit: Payer: Self-pay | Admitting: Medical Oncology

## 2013-10-23 ENCOUNTER — Telehealth: Payer: Self-pay | Admitting: Hematology and Oncology

## 2013-10-23 DIAGNOSIS — D45 Polycythemia vera: Secondary | ICD-10-CM

## 2013-10-23 NOTE — Telephone Encounter (Signed)
s.w. pt daughter and advised on 8.3 appt....ok and aware

## 2013-10-23 NOTE — Telephone Encounter (Signed)
NO, I have not seen it lately

## 2013-10-23 NOTE — Telephone Encounter (Signed)
CBC report received from Dr. Merilyn Baba office.  Placed on Dr. Calton Dach desk for review.  Daughter called earlier this week to report pt tired, decreased appetite and some dizziness.

## 2013-10-23 NOTE — Telephone Encounter (Signed)
Dr. Alvy Bimler reveiwed CBC and instructs for pt to increase Hydrea to 2 pills per day every day and also come in for Phlebotomy.  Repeat CBC one week after phlebotomy.  Carolyn Figueroa verbalized understanding but cannot bring pt in today.  She asks if pt can be scheduled for Monday afternoon latest possible.  She would like to repeat lab at Dr. Merilyn Baba again as it is much more convenient and closer to get pt to them for lab.

## 2013-10-23 NOTE — Telephone Encounter (Signed)
Faxed Rx request for CBC to be done on 8/11 to Dr. Merilyn Baba office at fax [phone].

## 2013-10-23 NOTE — Telephone Encounter (Signed)
Called Dr. Merilyn Baba office and requested labs be faxed to [place] again.

## 2013-10-26 ENCOUNTER — Ambulatory Visit (HOSPITAL_BASED_OUTPATIENT_CLINIC_OR_DEPARTMENT_OTHER): Payer: Medicare Other

## 2013-10-26 ENCOUNTER — Other Ambulatory Visit: Payer: Medicare Other

## 2013-10-26 ENCOUNTER — Other Ambulatory Visit: Payer: Self-pay | Admitting: Hematology and Oncology

## 2013-10-26 VITALS — BP 100/65 | HR 98 | Temp 98.3°F | Resp 16

## 2013-10-26 DIAGNOSIS — D45 Polycythemia vera: Secondary | ICD-10-CM

## 2013-10-26 NOTE — Patient Instructions (Signed)

## 2013-10-26 NOTE — Progress Notes (Signed)
Therapeutic phlebotomy of 525 cc taken from R AC 415pm-423 pm. Tolerated well. Snack taken. 30 minute observation. 1702 VS lower, instructed pt to get up slowly to help prevent dizzyness/falls. Escorted to lobby to wait for daughter.

## 2013-10-29 ENCOUNTER — Other Ambulatory Visit: Payer: Self-pay | Admitting: Hematology and Oncology

## 2013-10-29 ENCOUNTER — Telehealth: Payer: Self-pay | Admitting: *Deleted

## 2013-10-29 DIAGNOSIS — D45 Polycythemia vera: Secondary | ICD-10-CM

## 2013-10-29 MED ORDER — HYDROXYUREA 500 MG PO CAPS: 1000.0000 mg | ORAL_CAPSULE | Freq: Every day | ORAL | Status: DC

## 2013-10-29 NOTE — Telephone Encounter (Signed)
done

## 2013-10-29 NOTE — Telephone Encounter (Signed)
Daughter left VM states since Hydrea dose was increased to 2 pills daily,  Pt needs a new Rx sent to [facility] on [address].Marland Kitchen

## 2013-11-09 ENCOUNTER — Ambulatory Visit: Payer: Medicare Other | Admitting: Hematology and Oncology

## 2013-11-09 ENCOUNTER — Other Ambulatory Visit: Payer: Medicare Other

## 2013-11-16 ENCOUNTER — Ambulatory Visit (HOSPITAL_BASED_OUTPATIENT_CLINIC_OR_DEPARTMENT_OTHER): Payer: Medicare Other | Admitting: Hematology and Oncology

## 2013-11-16 ENCOUNTER — Encounter: Payer: Self-pay | Admitting: Hematology and Oncology

## 2013-11-16 ENCOUNTER — Telehealth: Payer: Self-pay | Admitting: Hematology and Oncology

## 2013-11-16 ENCOUNTER — Other Ambulatory Visit (HOSPITAL_BASED_OUTPATIENT_CLINIC_OR_DEPARTMENT_OTHER): Payer: Medicare Other

## 2013-11-16 VITALS — BP 145/87 | HR 92 | Temp 98.4°F | Resp 17 | Ht 62.0 in | Wt 129.7 lb

## 2013-11-16 DIAGNOSIS — R634 Abnormal weight loss: Secondary | ICD-10-CM

## 2013-11-16 DIAGNOSIS — D45 Polycythemia vera: Secondary | ICD-10-CM

## 2013-11-16 DIAGNOSIS — D473 Essential (hemorrhagic) thrombocythemia: Secondary | ICD-10-CM

## 2013-11-16 DIAGNOSIS — R259 Unspecified abnormal involuntary movements: Secondary | ICD-10-CM

## 2013-11-16 DIAGNOSIS — R251 Tremor, unspecified: Secondary | ICD-10-CM

## 2013-11-16 DIAGNOSIS — D75839 Thrombocytosis, unspecified: Secondary | ICD-10-CM

## 2013-11-16 HISTORY — DX: Abnormal weight loss: R63.4

## 2013-11-16 HISTORY — DX: Tremor, unspecified: R25.1

## 2013-11-16 LAB — CBC WITH DIFFERENTIAL/PLATELET
BASO%: 0.4 % (ref 0.0–2.0)
Basophils Absolute: 0 10*3/uL (ref 0.0–0.1)
EOS%: 1.2 % (ref 0.0–7.0)
Eosinophils Absolute: 0 10*3/uL (ref 0.0–0.5)
HCT: 40.1 % (ref 34.8–46.6)
HGB: 12.8 g/dL (ref 11.6–15.9)
LYMPH%: 37.4 % (ref 14.0–49.7)
MCH: 30 pg (ref 25.1–34.0)
MCHC: 32 g/dL (ref 31.5–36.0)
MCV: 93.9 fL (ref 79.5–101.0)
MONO#: 0.3 10*3/uL (ref 0.1–0.9)
MONO%: 8.3 % (ref 0.0–14.0)
NEUT#: 1.8 10*3/uL (ref 1.5–6.5)
NEUT%: 52.7 % (ref 38.4–76.8)
Platelets: 186 10*3/uL (ref 145–400)
RBC: 4.27 10*6/uL (ref 3.70–5.45)
RDW: 20 % — ABNORMAL HIGH (ref 11.2–14.5)
WBC: 3.5 10*3/uL — ABNORMAL LOW (ref 3.9–10.3)
lymph#: 1.3 10*3/uL (ref 0.9–3.3)

## 2013-11-16 LAB — COMPREHENSIVE METABOLIC PANEL (CC13)
ALT: <6 U/L (ref 0–55)
AST: 15 U/L (ref 5–34)
Albumin: 4 g/dL (ref 3.5–5.0)
Alkaline Phosphatase: 49 U/L (ref 40–150)
Anion Gap: 7 mEq/L (ref 3–11)
BUN: 30 mg/dL — ABNORMAL HIGH (ref 7.0–26.0)
CO2: 28 mEq/L (ref 22–29)
Calcium: 9.1 mg/dL (ref 8.4–10.4)
Chloride: 104 mEq/L (ref 98–109)
Creatinine: 0.9 mg/dL (ref 0.6–1.1)
Glucose: 89 mg/dl (ref 70–140)
Potassium: 4 mEq/L (ref 3.5–5.1)
Sodium: 139 mEq/L (ref 136–145)
Total Bilirubin: 1.45 mg/dL — ABNORMAL HIGH (ref 0.20–1.20)
Total Protein: 6.7 g/dL (ref 6.4–8.3)

## 2013-11-16 LAB — T4, FREE: Free T4: 1.16 ng/dL (ref 0.80–1.80)

## 2013-11-16 NOTE — Assessment & Plan Note (Signed)
This has resolved with increased dose hydroxyurea.

## 2013-11-16 NOTE — Telephone Encounter (Signed)
Pt confirmed labs/ov per 08/24 POF, gave pt AVS....KJ °

## 2013-11-16 NOTE — Progress Notes (Signed)
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 The patient never had bone marrow aspirate and biopsy. She was started on hydroxyurea and dose was adjusted due to hair thinning and fatigue On 09/30/2013, hydroxyurea dose was increased to 1000 mg on Mondays, Wednesdays and Fridays and 2 take 500 mg the rest of the week. On 10/22/2013, the dose of hydroxyurea was increased to 1000 mg daily along with phlebotomy. INTERVAL HISTORY: Please see below for problem oriented charting. She complained of anorexia and weight loss along with tremors over the past few months. She had lost 10 pounds of weight. She was started on Zoloft for mild depression.  REVIEW OF SYSTEMS:   Constitutional: Denies fevers, chills Eyes: Denies blurriness of vision Ears, nose, mouth, throat, and face: Denies mucositis or sore throat Respiratory: Denies cough, dyspnea or wheezes Cardiovascular: Denies palpitation, chest discomfort or lower extremity swelling Gastrointestinal:  Denies nausea, heartburn or change in bowel habits Skin: Denies abnormal skin rashes Lymphatics: Denies new lymphadenopathy or easy bruising Behavioral/Psych: Mood is stable, no new changes  All other systems were reviewed with the patient and are negative.  I have reviewed the past medical history, past surgical history, social history and family history with the patient and they are unchanged from previous  note.  ALLERGIES:  has No Known Allergies.  MEDICATIONS:  Current Outpatient Prescriptions  Medication Sig Dispense Refill  . aspirin 81 MG tablet Take 81 mg by mouth daily.      . Biotin 1 MG CAPS Take 1 tablet by mouth daily.      . clonazePAM (KLONOPIN) 0.5 MG tablet       . conjugated estrogens (PREMARIN) vaginal cream Place 0.5 g vaginally 3 (three) times a week.      . fesoterodine (TOVIAZ) 8 MG TB24 tablet Take 8 mg by mouth at bedtime.      . hydrochlorothiazide (HYDRODIURIL) 25 MG tablet Take 25 mg by mouth daily.      . hydroxyurea (HYDREA) 500 MG capsule Take 2 capsules (1,000 mg total) by mouth daily. May take with food to minimize GI side effects.  100 capsule  5  . lisinopril (PRINIVIL,ZESTRIL) 10 MG tablet       . OVER THE COUNTER MEDICATION Hair Skin and Nails supplement w/ 5,000 mcg of Biotin and multiple vitamin.      Marland Kitchen sertraline (ZOLOFT) 50 MG tablet        No current facility-administered medications for this visit.    PHYSICAL EXAMINATION: ECOG PERFORMANCE STATUS: 1 - Symptomatic but completely ambulatory  Filed Vitals:   11/16/13 1416  BP: 145/87  Pulse: 92  Temp: 98.4 F (36.9 C)  Resp: 17   Filed Weights   11/16/13 1416  Weight: 129 lb 11.2 oz (58.832 kg)    GENERAL:alert, no distress and comfortable SKIN: skin color, texture, turgor are normal, no rashes or significant lesions EYES: normal, Conjunctiva are pink and non-injected, sclera clear OROPHARYNX:no exudate, no erythema and lips, buccal mucosa, and tongue normal  NECK: supple, thyroid normal size,  non-tender, without nodularity LYMPH:  no palpable lymphadenopathy in the cervical, axillary or inguinal LUNGS: clear to auscultation and percussion with normal breathing effort HEART: regular rate & rhythm and no murmurs and no lower extremity edema ABDOMEN:abdomen soft, non-tender and normal bowel sounds Musculoskeletal:no cyanosis of digits and no clubbing  NEURO: alert & oriented x 3 with  fluent speech, with significant tremors throughout  LABORATORY DATA:  I have reviewed the data as listed    Component Value Date/Time   NA 140 08/20/2013 1247   NA 137 11/14/2011 1345   K 4.0 08/20/2013 1247   K 3.9 11/14/2011 1345   CL 102 07/30/2012 1419   CL 99 11/14/2011 1345   CO2 26 08/20/2013 1247   CO2 30 11/14/2011 1345   GLUCOSE 81 08/20/2013 1247   GLUCOSE 102* 07/30/2012 1419   GLUCOSE 153* 11/14/2011 1345   BUN 30.2* 08/20/2013 1247   BUN 32* 11/14/2011 1345   CREATININE 1.1 08/20/2013 1247   CREATININE 1.17* 11/14/2011 1345   CALCIUM 9.7 08/20/2013 1247   CALCIUM 9.1 11/14/2011 1345   PROT 7.5 08/20/2013 1247   PROT 6.6 11/14/2011 1345   ALBUMIN 4.0 08/20/2013 1247   ALBUMIN 4.0 11/14/2011 1345   AST 19 08/20/2013 1247   AST 15 11/14/2011 1345   ALT 9 08/20/2013 1247   ALT 9 11/14/2011 1345   ALKPHOS 80 08/20/2013 1247   ALKPHOS 37* 11/14/2011 1345   BILITOT 1.06 08/20/2013 1247   BILITOT 1.0 11/14/2011 1345   GFRNONAA 58* 11/22/2009 1855   GFRAA  Value: >60        The eGFR has been calculated using the MDRD equation. This calculation has not been validated in all clinical situations. eGFR's persistently <60 mL/min signify possible Chronic Kidney Disease. 11/22/2009 1855    No results found for this basename: SPEP,  UPEP,   kappa and lambda light chains    Lab Results  Component Value Date   WBC 3.5* 11/16/2013   NEUTROABS 1.8 11/16/2013   HGB 12.8 11/16/2013   HCT 40.1 11/16/2013   MCV 93.9 11/16/2013   PLT 186 11/16/2013      Chemistry      Component Value Date/Time   NA 140 08/20/2013 1247   NA 137 11/14/2011 1345   K 4.0 08/20/2013 1247   K 3.9 11/14/2011 1345   CL 102 07/30/2012 1419   CL 99 11/14/2011 1345   CO2 26 08/20/2013 1247   CO2 30 11/14/2011 1345   BUN 30.2* 08/20/2013 1247   BUN 32* 11/14/2011 1345   CREATININE 1.1 08/20/2013 1247   CREATININE 1.17* 11/14/2011 1345      Component Value Date/Time   CALCIUM 9.7 08/20/2013 1247   CALCIUM 9.1 11/14/2011 1345   ALKPHOS 80  08/20/2013 1247   ALKPHOS 37* 11/14/2011 1345   AST 19 08/20/2013 1247   AST 15 11/14/2011 1345   ALT 9 08/20/2013 1247   ALT 9 11/14/2011 1345   BILITOT 1.06 08/20/2013 1247   BILITOT 1.0 11/14/2011 1345      ASSESSMENT & PLAN:  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 I recommend continue hydroxyurea 1000 milligrams daily on Mondays to Fridays and to take 500 mg on Saturday and Sunday. I recommend repeat bloodwork in 2 months due to recent changes in treatment.    Weight loss  Cause is unknown, I suspect related to hyperthyroidism. I will order thyroid function tests.  Tremor  Cause is unknown, I suspect related to hyperthyroidism. I will  order thyroid function tests. If test result is unrevealing, she needs neurology evaluation.  Thrombocytosis This has resolved with increased dose hydroxyurea.    Orders Placed This Encounter  Procedures  . TSH    Standing Status: Future     Number of Occurrences:      Standing Expiration Date: 12/21/2014  . T4, free    Standing Status: Future     Number of Occurrences:      Standing Expiration Date: 12/21/2014  . CBC with Differential    Standing Status: Future     Number of Occurrences:      Standing Expiration Date: 12/21/2014   All questions were answered. The patient knows to call the clinic with any problems, questions or concerns. No barriers to learning was detected. I spent 25 minutes counseling the patient face to face. The total time spent in the appointment was 30 minutes and more than 50% was on counseling and review of test results     [facility], [place], MD 11/16/2013 2:51 PM

## 2013-11-16 NOTE — Assessment & Plan Note (Signed)
She is doing well. I recommend continue hydroxyurea 1000 milligrams daily on Mondays to Fridays and to take 500 mg on Saturday and Sunday. I recommend repeat bloodwork in 2 months due to recent changes in treatment.

## 2013-11-16 NOTE — Assessment & Plan Note (Signed)
Cause is unknown, I suspect related to hyperthyroidism. I will order thyroid function tests. If test result is unrevealing, she needs neurology evaluation.

## 2013-11-16 NOTE — Assessment & Plan Note (Signed)
Cause is unknown, I suspect related to hyperthyroidism. I will order thyroid function tests.

## 2013-11-17 ENCOUNTER — Telehealth: Payer: Self-pay | Admitting: Medical Oncology

## 2013-11-17 LAB — TSH CHCC: TSH: 1.462 m(IU)/L (ref 0.308–3.960)

## 2013-11-17 NOTE — Telephone Encounter (Signed)
Return call to patient's daughter regarding request for results from thyroid test being negative and per MD asked if patient wanted referral for neurology consult. Per daughter, they will follow up with PCP regarding Zoloft and on upcoming appt will discuss with Dr. Alvy Bimler whether they wish to be referred.  MD inboxed.

## 2014-01-08 ENCOUNTER — Other Ambulatory Visit: Payer: Self-pay

## 2014-01-19 ENCOUNTER — Ambulatory Visit (HOSPITAL_BASED_OUTPATIENT_CLINIC_OR_DEPARTMENT_OTHER): Payer: Medicare Other | Admitting: Hematology and Oncology

## 2014-01-19 ENCOUNTER — Other Ambulatory Visit (HOSPITAL_BASED_OUTPATIENT_CLINIC_OR_DEPARTMENT_OTHER): Payer: Medicare Other

## 2014-01-19 ENCOUNTER — Telehealth: Payer: Self-pay | Admitting: Hematology and Oncology

## 2014-01-19 VITALS — BP 154/87 | HR 79 | Temp 98.6°F | Resp 18 | Ht 62.0 in | Wt 130.7 lb

## 2014-01-19 DIAGNOSIS — D45 Polycythemia vera: Secondary | ICD-10-CM

## 2014-01-19 DIAGNOSIS — R251 Tremor, unspecified: Secondary | ICD-10-CM

## 2014-01-19 LAB — CBC WITH DIFFERENTIAL/PLATELET
BASO%: 0.4 % (ref 0.0–2.0)
Basophils Absolute: 0 10*3/uL (ref 0.0–0.1)
EOS%: 1.1 % (ref 0.0–7.0)
Eosinophils Absolute: 0.1 10*3/uL (ref 0.0–0.5)
HCT: 38.7 % (ref 34.8–46.6)
HGB: 12.3 g/dL (ref 11.6–15.9)
LYMPH%: 27.5 % (ref 14.0–49.7)
MCH: 31.5 pg (ref 25.1–34.0)
MCHC: 31.8 g/dL (ref 31.5–36.0)
MCV: 98.9 fL (ref 79.5–101.0)
MONO#: 0.4 10*3/uL (ref 0.1–0.9)
MONO%: 7.7 % (ref 0.0–14.0)
NEUT#: 3.3 10*3/uL (ref 1.5–6.5)
NEUT%: 63.3 % (ref 38.4–76.8)
Platelets: 309 10*3/uL (ref 145–400)
RBC: 3.91 10*6/uL (ref 3.70–5.45)
RDW: 16.5 % — ABNORMAL HIGH (ref 11.2–14.5)
WBC: 5.2 10*3/uL (ref 3.9–10.3)
lymph#: 1.4 10*3/uL (ref 0.9–3.3)

## 2014-01-19 NOTE — Progress Notes (Signed)
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 The patient never had bone marrow aspirate and biopsy. She was started on hydroxyurea and dose was adjusted due to hair thinning and fatigue On 09/30/2013, hydroxyurea dose was increased to 1000 mg on Mondays, Wednesdays and Fridays and 2 take 500 mg the rest of the week. On 10/22/2013, the dose of hydroxyurea was increased to 1000 mg daily along with phlebotomy. On August 2015, the dose of hydroxyurea was modified to 1000 mg daily Mondays to Fridays and 500 mg on Saturdays and Sunday.  INTERVAL HISTORY: Please see below for problem oriented charting. She is doing well. She has less tremor.  REVIEW OF SYSTEMS:   Constitutional: Denies fevers, chills or abnormal weight loss Eyes: Denies blurriness of vision Ears, nose, mouth, throat, and face: Denies mucositis or sore throat Respiratory: Denies cough, dyspnea or wheezes Cardiovascular: Denies palpitation, chest discomfort or lower extremity swelling Gastrointestinal:  Denies nausea, heartburn or change in bowel habits Skin: Denies abnormal skin rashes Lymphatics: Denies new lymphadenopathy or easy bruising Neurological:Denies numbness, tingling or new weaknesses Behavioral/Psych: Mood is stable, no new changes  All other systems were reviewed with the patient and are negative.  I have reviewed  the past medical history, past surgical history, social history and family history with the patient and they are unchanged from previous note.  ALLERGIES:  has No Known Allergies.  MEDICATIONS:  Current Outpatient Prescriptions  Medication Sig Dispense Refill  . aspirin 81 MG tablet Take 81 mg by mouth daily.      . Biotin 1 MG CAPS Take 1 tablet by mouth daily.      . Cholecalciferol (VITAMIN D3) 2000 UNITS capsule Take by mouth.      . conjugated estrogens (PREMARIN) vaginal cream Place 0.5 g vaginally 3 (three) times a week.      . hydroxyurea (HYDREA) 500 MG capsule Take 2 capsules (1,000 mg total) by mouth daily. May take with food to minimize GI side effects.  100 capsule  5  . lisinopril (PRINIVIL,ZESTRIL) 10 MG tablet       . sertraline (ZOLOFT) 50 MG tablet       . hydrochlorothiazide (HYDRODIURIL) 25 MG tablet Take 25 mg by mouth daily.       No current facility-administered medications for this visit.    PHYSICAL EXAMINATION: ECOG PERFORMANCE STATUS: 0 - Asymptomatic  Filed Vitals:   01/19/14 1451  BP: 154/87  Pulse: 79  Temp: 98.6 F (37 C)  Resp: 18   Filed Weights   01/19/14 1451  Weight: 130 lb 11.2 oz (59.285 kg)    GENERAL:alert, no distress and comfortable SKIN: skin color, texture, turgor are normal, no rashes or significant lesions EYES: normal, Conjunctiva are pink and non-injected, sclera clear Musculoskeletal:no cyanosis of digits and no clubbing  NEURO: alert & oriented x 3 with fluent speech, no focal motor/sensory deficits  LABORATORY DATA:  I have reviewed the data as listed    Component Value Date/Time  NA 139 11/16/2013 1404   NA 137 11/14/2011 1345   K 4.0 11/16/2013 1404   K 3.9 11/14/2011 1345   CL 102 07/30/2012 1419   CL 99 11/14/2011 1345   CO2 28 11/16/2013 1404   CO2 30 11/14/2011 1345   GLUCOSE 89 11/16/2013 1404   GLUCOSE 102* 07/30/2012 1419   GLUCOSE 153* 11/14/2011 1345   BUN 30.0* 11/16/2013 1404   BUN 32* 11/14/2011 1345    CREATININE 0.9 11/16/2013 1404   CREATININE 1.17* 11/14/2011 1345   CALCIUM 9.1 11/16/2013 1404   CALCIUM 9.1 11/14/2011 1345   PROT 6.7 11/16/2013 1404   PROT 6.6 11/14/2011 1345   ALBUMIN 4.0 11/16/2013 1404   ALBUMIN 4.0 11/14/2011 1345   AST 15 11/16/2013 1404   AST 15 11/14/2011 1345   ALT <6 11/16/2013 1404   ALT 9 11/14/2011 1345   ALKPHOS 49 11/16/2013 1404   ALKPHOS 37* 11/14/2011 1345   BILITOT 1.45* 11/16/2013 1404   BILITOT 1.0 11/14/2011 1345   GFRNONAA 58* 11/22/2009 1855   GFRAA  Value: >60        The eGFR has been calculated using the MDRD equation. This calculation has not been validated in all clinical situations. eGFR's persistently <60 mL/min signify possible Chronic Kidney Disease. 11/22/2009 1855    No results found for this basename: SPEP,  UPEP,   kappa and lambda light chains    Lab Results  Component Value Date   WBC 5.2 01/19/2014   NEUTROABS 3.3 01/19/2014   HGB 12.3 01/19/2014   HCT 38.7 01/19/2014   MCV 98.9 01/19/2014   PLT 309 01/19/2014      Chemistry      Component Value Date/Time   NA 139 11/16/2013 1404   NA 137 11/14/2011 1345   K 4.0 11/16/2013 1404   K 3.9 11/14/2011 1345   CL 102 07/30/2012 1419   CL 99 11/14/2011 1345   CO2 28 11/16/2013 1404   CO2 30 11/14/2011 1345   BUN 30.0* 11/16/2013 1404   BUN 32* 11/14/2011 1345   CREATININE 0.9 11/16/2013 1404   CREATININE 1.17* 11/14/2011 1345      Component Value Date/Time   CALCIUM 9.1 11/16/2013 1404   CALCIUM 9.1 11/14/2011 1345   ALKPHOS 49 11/16/2013 1404   ALKPHOS 37* 11/14/2011 1345   AST 15 11/16/2013 1404   AST 15 11/14/2011 1345   ALT <6 11/16/2013 1404   ALT 9 11/14/2011 1345   BILITOT 1.45* 11/16/2013 1404   BILITOT 1.0 11/14/2011 1345      ASSESSMENT & PLAN:  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 I recommend continue hydroxyurea 1000 milligrams daily on Mondays to Fridays and to take 500 mg on Saturday and Sunday. I recommend repeat bloodwork in 6 months.      Tremor Cause is unknown,  much improved compared to prior visit. Recommend observation only.    Orders Placed This Encounter  Procedures  . CBC with Differential    Standing Status: Future     Number of Occurrences:      Standing Expiration Date: 02/23/2015   All questions were answered. The patient knows to call the clinic with any problems, questions or concerns. No barriers to learning was detected. I spent 15 minutes counseling the patient face to face. The total time spent in the appointment was 20 minutes and more than 50% was on counseling and review of test results     [facility], [place], MD 01/19/2014 4:09 PM

## 2014-01-19 NOTE — Telephone Encounter (Signed)
LM to confirm appt for April 2016.

## 2014-01-19 NOTE — Assessment & Plan Note (Signed)
Cause is unknown, much improved compared to prior visit. Recommend observation only.   

## 2014-01-19 NOTE — Assessment & Plan Note (Signed)
She is doing well. I recommend continue hydroxyurea 1000 milligrams daily on Mondays to Fridays and to take 500 mg on Saturday and Sunday. I recommend repeat bloodwork in 6 months.

## 2014-02-22 ENCOUNTER — Other Ambulatory Visit: Payer: Medicare Other

## 2014-02-22 ENCOUNTER — Ambulatory Visit: Payer: Medicare Other | Admitting: Hematology and Oncology

## 2014-03-09 ENCOUNTER — Telehealth: Payer: Self-pay | Admitting: *Deleted

## 2014-03-09 NOTE — Telephone Encounter (Signed)
Daughter requests rx for lab be sent to Dr. Merilyn Baba office.   Pt has appt there next week on Wednesday 12/23.  Faxed Rx for CBC w/ diff to Dr. Merilyn Baba office at fax [phone].   Left VM for daughter to inform this was done.

## 2014-07-15 ENCOUNTER — Other Ambulatory Visit: Payer: Self-pay | Admitting: Family

## 2014-07-15 ENCOUNTER — Other Ambulatory Visit: Payer: Self-pay

## 2014-07-15 DIAGNOSIS — Z1231 Encounter for screening mammogram for malignant neoplasm of breast: Secondary | ICD-10-CM

## 2014-07-15 DIAGNOSIS — R5381 Other malaise: Secondary | ICD-10-CM

## 2014-07-20 ENCOUNTER — Ambulatory Visit (HOSPITAL_BASED_OUTPATIENT_CLINIC_OR_DEPARTMENT_OTHER): Payer: Medicare Other | Admitting: Hematology and Oncology

## 2014-07-20 ENCOUNTER — Other Ambulatory Visit (HOSPITAL_BASED_OUTPATIENT_CLINIC_OR_DEPARTMENT_OTHER): Payer: Medicare Other

## 2014-07-20 ENCOUNTER — Telehealth: Payer: Self-pay | Admitting: Hematology and Oncology

## 2014-07-20 ENCOUNTER — Encounter: Payer: Self-pay | Admitting: Hematology and Oncology

## 2014-07-20 VITALS — BP 115/65 | HR 103 | Temp 97.6°F | Resp 18 | Ht 62.0 in | Wt 140.9 lb

## 2014-07-20 DIAGNOSIS — D45 Polycythemia vera: Secondary | ICD-10-CM

## 2014-07-20 DIAGNOSIS — R251 Tremor, unspecified: Secondary | ICD-10-CM | POA: Diagnosis not present

## 2014-07-20 LAB — CBC WITH DIFFERENTIAL/PLATELET
BASO%: 0.8 % (ref 0.0–2.0)
Basophils Absolute: 0.1 10*3/uL (ref 0.0–0.1)
EOS%: 5.2 % (ref 0.0–7.0)
Eosinophils Absolute: 0.3 10*3/uL (ref 0.0–0.5)
HCT: 45.4 % (ref 34.8–46.6)
HGB: 14.6 g/dL (ref 11.6–15.9)
LYMPH%: 32.3 % (ref 14.0–49.7)
MCH: 29.6 pg (ref 25.1–34.0)
MCHC: 32.2 g/dL (ref 31.5–36.0)
MCV: 91.9 fL (ref 79.5–101.0)
MONO#: 0.5 10*3/uL (ref 0.1–0.9)
MONO%: 7.3 % (ref 0.0–14.0)
NEUT#: 3.4 10*3/uL (ref 1.5–6.5)
NEUT%: 54.4 % (ref 38.4–76.8)
Platelets: 406 10*3/uL — ABNORMAL HIGH (ref 145–400)
RBC: 4.94 10*6/uL (ref 3.70–5.45)
RDW: 15.4 % — ABNORMAL HIGH (ref 11.2–14.5)
WBC: 6.3 10*3/uL (ref 3.9–10.3)
lymph#: 2 10*3/uL (ref 0.9–3.3)

## 2014-07-20 MED ORDER — HYDROXYUREA 500 MG PO CAPS: ORAL_CAPSULE | ORAL | Status: DC

## 2014-07-20 NOTE — Progress Notes (Signed)
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 The patient never had bone marrow aspirate and biopsy. She was started on hydroxyurea and dose was adjusted due to hair thinning and fatigue On 09/30/2013, hydroxyurea dose was increased to 1000 mg on Mondays, Wednesdays and Fridays and 2 take 500 mg the rest of the week. On 10/22/2013, the dose of hydroxyurea was increased to 1000 mg daily along with phlebotomy. On August 2015, the dose of hydroxyurea was modified to 1000 mg daily Mondays to Fridays and 500 mg on Saturdays and Sunday.  However, the patient was not following instruction correctly. On 07/20/2014, the dose of hydroxyurea was modifed to 500 mg daily Mondays to Fridays and 1000 mg on Saturdays and Sundays  INTERVAL HISTORY: Please see below for problem oriented charting.  she feels well. She have persistent remor but it does not bother her. Denies recent infection. No recent diagnosis of blood clot. No recent bleeding.  REVIEW OF SYSTEMS:   Constitutional: Denies fevers, chills or abnormal weight loss Eyes: Denies blurriness of vision Ears, nose, mouth, throat, and face: Denies mucositis or sore throat Respiratory: Denies cough, dyspnea or wheezes Cardiovascular: Denies palpitation, chest discomfort or lower extremity swelling Gastrointestinal:  Denies nausea, heartburn or change in  bowel habits Skin: Denies abnormal skin rashes Lymphatics: Denies new lymphadenopathy or easy bruising Neurological:Denies numbness, tingling or new weaknesses Behavioral/Psych: Mood is stable, no new changes  All other systems were reviewed with the patient and are negative.  I have reviewed the past medical history, past surgical history, social history and family history with the patient and they are unchanged from previous note.  ALLERGIES:  has No Known Allergies.  MEDICATIONS:  Current Outpatient Prescriptions  Medication Sig Dispense Refill  . aspirin 81 MG tablet Take 81 mg by mouth daily.    . Biotin 1 MG CAPS Take 1 tablet by mouth daily.    . Cholecalciferol (VITAMIN D3) 2000 UNITS capsule Take by mouth.    . conjugated estrogens (PREMARIN) vaginal cream Place 0.5 g vaginally 3 (three) times a week.    . hydrochlorothiazide (HYDRODIURIL) 25 MG tablet Take 25 mg by mouth daily.    . hydroxyurea (HYDREA) 500 MG capsule Take 2 capsules (1,000 mg total) by mouth daily. May take with food to minimize GI side effects. 100 capsule 5  . lisinopril (PRINIVIL,ZESTRIL) 20 MG tablet Take 20 mg by mouth.    . sertraline (ZOLOFT) 50 MG tablet      No current facility-administered medications for this visit.    PHYSICAL EXAMINATION: ECOG PERFORMANCE STATUS: 0 - Asymptomatic  Filed Vitals:   07/20/14 1406  BP: 115/65  Pulse: 103  Temp: 97.6 F (36.4 C)  Resp: 18   Filed Weights   07/20/14 1406  Weight: 140 lb 14.4 oz (63.912 kg)    GENERAL:alert, no distress and comfortable SKIN: skin color, texture, turgor are normal, no rashes or significant lesions EYES: normal, Conjunctiva are pink and non-injected, sclera clear OROPHARYNX:no exudate,  no erythema and lips, buccal mucosa, and tongue normal  NECK: supple, thyroid normal size, non-tender, without nodularity LYMPH:  no palpable lymphadenopathy in the cervical, axillary or inguinal LUNGS: clear to auscultation and percussion  with normal breathing effort HEART: regular rate & rhythm and no murmurs and no lower extremity edema ABDOMEN:abdomen soft, non-tender and normal bowel sounds Musculoskeletal:no cyanosis of digits and no clubbing  NEURO: alert & oriented x 3 with fluent speech, no focal motor/sensory deficits. She appeared tremulous  LABORATORY DATA:  I have reviewed the data as listed    Component Value Date/Time   NA 139 11/16/2013 1404   NA 137 11/14/2011 1345   K 4.0 11/16/2013 1404   K 3.9 11/14/2011 1345   CL 102 07/30/2012 1419   CL 99 11/14/2011 1345   CO2 28 11/16/2013 1404   CO2 30 11/14/2011 1345   GLUCOSE 89 11/16/2013 1404   GLUCOSE 102* 07/30/2012 1419   GLUCOSE 153* 11/14/2011 1345   BUN 30.0* 11/16/2013 1404   BUN 32* 11/14/2011 1345   CREATININE 0.9 11/16/2013 1404   CREATININE 1.17* 11/14/2011 1345   CALCIUM 9.1 11/16/2013 1404   CALCIUM 9.1 11/14/2011 1345   PROT 6.7 11/16/2013 1404   PROT 6.6 11/14/2011 1345   ALBUMIN 4.0 11/16/2013 1404   ALBUMIN 4.0 11/14/2011 1345   AST 15 11/16/2013 1404   AST 15 11/14/2011 1345   ALT <6 11/16/2013 1404   ALT 9 11/14/2011 1345   ALKPHOS 49 11/16/2013 1404   ALKPHOS 37* 11/14/2011 1345   BILITOT 1.45* 11/16/2013 1404   BILITOT 1.0 11/14/2011 1345   GFRNONAA 58* 11/22/2009 1855   GFRAA  11/22/2009 1855    >60        The eGFR has been calculated using the MDRD equation. This calculation has not been validated in all clinical situations. eGFR's persistently <60 mL/min signify possible Chronic Kidney Disease.    No results found for: SPEP, UPEP  Lab Results  Component Value Date   WBC 6.3 07/20/2014   NEUTROABS 3.4 07/20/2014   HGB 14.6 07/20/2014   HCT 45.4 07/20/2014   MCV 91.9 07/20/2014   PLT 406* 07/20/2014      Chemistry      Component Value Date/Time   NA 139 11/16/2013 1404   NA 137 11/14/2011 1345   K 4.0 11/16/2013 1404   K 3.9 11/14/2011 1345   CL 102 07/30/2012 1419   CL 99 11/14/2011 1345   CO2  28 11/16/2013 1404   CO2 30 11/14/2011 1345   BUN 30.0* 11/16/2013 1404   BUN 32* 11/14/2011 1345   CREATININE 0.9 11/16/2013 1404   CREATININE 1.17* 11/14/2011 1345      Component Value Date/Time   CALCIUM 9.1 11/16/2013 1404   CALCIUM 9.1 11/14/2011 1345   ALKPHOS 49 11/16/2013 1404   ALKPHOS 37* 11/14/2011 1345   AST 15 11/16/2013 1404   AST 15 11/14/2011 1345   ALT <6 11/16/2013 1404   ALT 9 11/14/2011 1345   BILITOT 1.45* 11/16/2013 1404   BILITOT 1.0 11/14/2011 1345      ASSESSMENT & PLAN:  Polycythemia vera Unfortunately, the patient was not taking medication as prescribed. She was only taking 1 tablet per day for the last 6 months and I noticed that her hemoglobin and platelet count rising. I will attempt to modify her treatment again to 500 mg daily hydroxyurea except on Saturday and Sunday she take 1000 mg. Plan to see her back in 3 months  with repeat blood work, history and physical examination   Tremor Cause is unknown, much improved compared to prior visit. Recommend observation only.        Orders Placed This Encounter  Procedures  . CBC with Differential/Platelet    Standing Status: Standing     Number of Occurrences: 9     Standing Expiration Date: 07/20/2015   All questions were answered. The patient knows to call the clinic with any problems, questions or concerns. No barriers to learning was detected. I spent 15 minutes counseling the patient face to face. The total time spent in the appointment was 20 minutes and more than 50% was on counseling and review of test results     [facility], Lorenzo, MD 07/20/2014 3:00 PM

## 2014-07-20 NOTE — Assessment & Plan Note (Signed)
Unfortunately, the patient was not taking medication as prescribed. She was only taking 1 tablet per day for the last 6 months and I noticed that her hemoglobin and platelet count rising. I will attempt to modify her treatment again to 500 mg daily hydroxyurea except on Saturday and Sunday she take 1000 mg. Plan to see her back in 3 months with repeat blood work, history and physical examination

## 2014-07-20 NOTE — Assessment & Plan Note (Signed)
Cause is unknown, much improved compared to prior visit. Recommend observation only.

## 2014-07-20 NOTE — Telephone Encounter (Signed)
s.w. pt and confirmed appt....pt ok and aware °

## 2014-07-29 ENCOUNTER — Other Ambulatory Visit: Payer: Self-pay | Admitting: Family

## 2014-07-29 ENCOUNTER — Encounter: Payer: Self-pay | Admitting: Hematology and Oncology

## 2014-07-29 DIAGNOSIS — M8580 Other specified disorders of bone density and structure, unspecified site: Secondary | ICD-10-CM

## 2014-08-04 ENCOUNTER — Ambulatory Visit
Admission: RE | Admit: 2014-08-04 | Discharge: 2014-08-04 | Disposition: A | Discharge status: Home / Self Care | Payer: Medicare Other | Source: Ambulatory Visit

## 2014-08-04 ENCOUNTER — Ambulatory Visit
Admission: RE | Admit: 2014-08-04 | Discharge: 2014-08-04 | Disposition: A | Discharge status: Home / Self Care | Payer: Medicare Other | Source: Ambulatory Visit | Attending: Family | Admitting: Family

## 2014-08-04 DIAGNOSIS — M8580 Other specified disorders of bone density and structure, unspecified site: Secondary | ICD-10-CM

## 2014-08-04 DIAGNOSIS — Z1231 Encounter for screening mammogram for malignant neoplasm of breast: Secondary | ICD-10-CM

## 2014-09-20 ENCOUNTER — Other Ambulatory Visit: Payer: Self-pay

## 2014-10-19 ENCOUNTER — Other Ambulatory Visit (HOSPITAL_BASED_OUTPATIENT_CLINIC_OR_DEPARTMENT_OTHER): Payer: Medicare Other

## 2014-10-19 ENCOUNTER — Other Ambulatory Visit: Payer: Medicare Other

## 2014-10-19 ENCOUNTER — Ambulatory Visit (HOSPITAL_BASED_OUTPATIENT_CLINIC_OR_DEPARTMENT_OTHER): Payer: Medicare Other | Admitting: Hematology and Oncology

## 2014-10-19 ENCOUNTER — Encounter: Payer: Self-pay | Admitting: Hematology and Oncology

## 2014-10-19 ENCOUNTER — Ambulatory Visit: Payer: Medicare Other | Admitting: Hematology and Oncology

## 2014-10-19 ENCOUNTER — Telehealth: Payer: Self-pay | Admitting: Hematology and Oncology

## 2014-10-19 VITALS — BP 136/63 | HR 92 | Temp 98.5°F | Resp 18 | Ht 62.0 in | Wt 141.6 lb

## 2014-10-19 DIAGNOSIS — R251 Tremor, unspecified: Secondary | ICD-10-CM

## 2014-10-19 DIAGNOSIS — D45 Polycythemia vera: Secondary | ICD-10-CM

## 2014-10-19 LAB — CBC WITH DIFFERENTIAL/PLATELET
BASO%: 0.7 % (ref 0.0–2.0)
Basophils Absolute: 0 10*3/uL (ref 0.0–0.1)
EOS%: 3.1 % (ref 0.0–7.0)
Eosinophils Absolute: 0.1 10*3/uL (ref 0.0–0.5)
HCT: 40.6 % (ref 34.8–46.6)
HGB: 13 g/dL (ref 11.6–15.9)
LYMPH%: 34.9 % (ref 14.0–49.7)
MCH: 30.2 pg (ref 25.1–34.0)
MCHC: 32.1 g/dL (ref 31.5–36.0)
MCV: 94.2 fL (ref 79.5–101.0)
MONO#: 0.5 10*3/uL (ref 0.1–0.9)
MONO%: 9.8 % (ref 0.0–14.0)
NEUT#: 2.4 10*3/uL (ref 1.5–6.5)
NEUT%: 51.5 % (ref 38.4–76.8)
Platelets: 306 10*3/uL (ref 145–400)
RBC: 4.32 10*6/uL (ref 3.70–5.45)
RDW: 18.1 % — ABNORMAL HIGH (ref 11.2–14.5)
WBC: 4.7 10*3/uL (ref 3.9–10.3)
lymph#: 1.7 10*3/uL (ref 0.9–3.3)

## 2014-10-19 NOTE — Telephone Encounter (Signed)
per pof to sch pt appt-gave pt copy of avs °

## 2014-10-20 NOTE — Progress Notes (Signed)
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 The patient never had bone marrow aspirate and biopsy. She was started on hydroxyurea and dose was adjusted due to hair thinning and fatigue On 09/30/2013, hydroxyurea dose was increased to 1000 mg on Mondays, Wednesdays and Fridays and 2 take 500 mg the rest of the week. On 10/22/2013, the dose of hydroxyurea was increased to 1000 mg daily along with phlebotomy. On August 2015, the dose of hydroxyurea was modified to 1000 mg daily Mondays to Fridays and 500 mg on Saturdays and Sunday.  However, the patient was not following instruction correctly. On 07/20/2014, the dose of hydroxyurea was modifed to 500 mg daily Mondays to Fridays and 1000 mg on Saturdays and Sundays  INTERVAL HISTORY: Please see below for problem oriented charting. She is doing well. Denies recent infections. The patient denies any recent signs or symptoms of bleeding such as spontaneous epistaxis, hematuria or hematochezia.   REVIEW OF SYSTEMS:   Constitutional: Denies fevers, chills or abnormal weight loss Eyes: Denies blurriness of vision Ears, nose, mouth, throat, and face: Denies mucositis or sore throat Respiratory: Denies cough, dyspnea or wheezes Cardiovascular: Denies palpitation, chest discomfort or lower extremity swelling Gastrointestinal:  Denies nausea, heartburn or  change in bowel habits Skin: Denies abnormal skin rashes Lymphatics: Denies new lymphadenopathy or easy bruising Neurological:Denies numbness, tingling or new weaknesses Behavioral/Psych: Mood is stable, no new changes  All other systems were reviewed with the patient and are negative.  I have reviewed the past medical history, past surgical history, social history and family history with the patient and they are unchanged from previous note.  ALLERGIES:  has No Known Allergies.  MEDICATIONS:  Current Outpatient Prescriptions  Medication Sig Dispense Refill  . aspirin 81 MG tablet Take 81 mg by mouth daily.    . Biotin 1 MG CAPS Take 1 tablet by mouth daily.    . Cholecalciferol (VITAMIN D3) 2000 UNITS capsule Take by mouth.    . conjugated estrogens (PREMARIN) vaginal cream Place 0.5 g vaginally 3 (three) times a week.    . hydrochlorothiazide (HYDRODIURIL) 25 MG tablet Take 25 mg by mouth daily.    . hydroxyurea (HYDREA) 500 MG capsule Take 1 tablet daily except on Saturdays & Sundays take 2 tablets 100 capsule 5  . lisinopril (PRINIVIL,ZESTRIL) 20 MG tablet Take 20 mg by mouth.    . sertraline (ZOLOFT) 50 MG tablet      No current facility-administered medications for this visit.    PHYSICAL EXAMINATION: ECOG PERFORMANCE STATUS: 0 - Asymptomatic  Filed Vitals:   10/19/14 1421  BP: 136/63  Pulse: 92  Temp: 98.5 F (36.9 C)  Resp: 18   Filed Weights   10/19/14 1421  Weight: 141 lb 9.6 oz (64.229 kg)    GENERAL:alert, no distress and comfortable SKIN: skin color, texture, turgor are normal, no rashes or significant lesions EYES: normal, Conjunctiva are pink and non-injected, sclera clear OROPHARYNX:no exudate, no erythema and lips, buccal mucosa, and tongue  normal  NECK: supple, thyroid normal size, non-tender, without nodularity LYMPH:  no palpable lymphadenopathy in the cervical, axillary or inguinal LUNGS: clear to auscultation and percussion with normal breathing  effort HEART: regular rate & rhythm and no murmurs and no lower extremity edema ABDOMEN:abdomen soft, non-tender and normal bowel sounds Musculoskeletal:no cyanosis of digits and no clubbing  NEURO: alert & oriented x 3 with fluent speech, noted more pronounced tremors with mild cog-wheeling without focal motor/sensory deficits  LABORATORY DATA:  I have reviewed the data as listed    Component Value Date/Time   NA 139 11/16/2013 1404   NA 137 11/14/2011 1345   K 4.0 11/16/2013 1404   K 3.9 11/14/2011 1345   CL 102 07/30/2012 1419   CL 99 11/14/2011 1345   CO2 28 11/16/2013 1404   CO2 30 11/14/2011 1345   GLUCOSE 89 11/16/2013 1404   GLUCOSE 102* 07/30/2012 1419   GLUCOSE 153* 11/14/2011 1345   BUN 30.0* 11/16/2013 1404   BUN 32* 11/14/2011 1345   CREATININE 0.9 11/16/2013 1404   CREATININE 1.17* 11/14/2011 1345   CALCIUM 9.1 11/16/2013 1404   CALCIUM 9.1 11/14/2011 1345   PROT 6.7 11/16/2013 1404   PROT 6.6 11/14/2011 1345   ALBUMIN 4.0 11/16/2013 1404   ALBUMIN 4.0 11/14/2011 1345   AST 15 11/16/2013 1404   AST 15 11/14/2011 1345   ALT <6 11/16/2013 1404   ALT 9 11/14/2011 1345   ALKPHOS 49 11/16/2013 1404   ALKPHOS 37* 11/14/2011 1345   BILITOT 1.45* 11/16/2013 1404   BILITOT 1.0 11/14/2011 1345   GFRNONAA 58* 11/22/2009 1855   GFRAA  11/22/2009 1855    >60        The eGFR has been calculated using the MDRD equation. This calculation has not been validated in all clinical situations. eGFR's persistently <60 mL/min signify possible Chronic Kidney Disease.    No results found for: SPEP, UPEP  Lab Results  Component Value Date   WBC 4.7 10/19/2014   NEUTROABS 2.4 10/19/2014   HGB 13.0 10/19/2014   HCT 40.6 10/19/2014   MCV 94.2 10/19/2014   PLT 306 10/19/2014      Chemistry      Component Value Date/Time   NA 139 11/16/2013 1404   NA 137 11/14/2011 1345   K 4.0 11/16/2013 1404   K 3.9 11/14/2011 1345   CL 102 07/30/2012 1419   CL 99 11/14/2011  1345   CO2 28 11/16/2013 1404   CO2 30 11/14/2011 1345   BUN 30.0* 11/16/2013 1404   BUN 32* 11/14/2011 1345   CREATININE 0.9 11/16/2013 1404   CREATININE 1.17* 11/14/2011 1345      Component Value Date/Time   CALCIUM 9.1 11/16/2013 1404   CALCIUM 9.1 11/14/2011 1345   ALKPHOS 49 11/16/2013 1404   ALKPHOS 37* 11/14/2011 1345   AST 15 11/16/2013 1404   AST 15 11/14/2011 1345   ALT <6 11/16/2013 1404   ALT 9 11/14/2011 1345   BILITOT 1.45* 11/16/2013 1404   BILITOT 1.0 11/14/2011 1345      ASSESSMENT & PLAN:  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 She will continue treatment taking Hydroxyurea 500 mg daily except on Saturday and Sunday she take 1000 mg. Plan to see her back in 4 months with repeat blood work, history and physical examination    Tremor Cause is unknown, much worse compared to prior visit.  Examination today revealed mild cog-wheeling and I  am wondering whether she is developing Parkinson's disease I suggested Neurology consultation and she agreed to proceed   Orders Placed This Encounter  Procedures  . Ambulatory referral to Neurology    Referral Priority:  Routine    Referral Type:  Consultation    Referral Reason:  Specialty Services Required    Referred to Provider:  Ludwig Clarks, DO    Requested Specialty:  Neurology    Number of Visits Requested:  1   All questions were answered. The patient knows to call the clinic with any problems, questions or concerns. No barriers to learning was detected. I spent 15 minutes counseling the patient face to face. The total time spent in the appointment was 20 minutes and more than 50% was on counseling and review of test results     [facility], Randale Carvalho, MD 10/20/2014 8:22 PM

## 2014-10-20 NOTE — Assessment & Plan Note (Signed)
Cause is unknown, much worse compared to prior visit.  Examination today revealed mild cog-wheeling and I am wondering whether she is developing Parkinson's disease I suggested Neurology consultation and she agreed to proceed

## 2014-10-20 NOTE — Assessment & Plan Note (Signed)
She is doing well and had been compliant with her treatment. She will continue treatment taking Hydroxyurea 500 mg daily except on Saturday and Sunday she take 1000 mg. Plan to see her back in 4 months with repeat blood work, history and physical examination

## 2014-11-26 ENCOUNTER — Telehealth: Payer: Self-pay | Admitting: Hematology and Oncology

## 2014-11-26 ENCOUNTER — Telehealth: Payer: Self-pay | Admitting: *Deleted

## 2014-11-26 NOTE — Telephone Encounter (Signed)
per Hinton Dyer @ Dr Tat office referral in Work que now and she will call pt to get appt scheduled. Apparently department was put in incorrectly. Corrected now and she saw patients name there.

## 2014-11-26 NOTE — Telephone Encounter (Signed)
Daughter left VM states they have not heard anything about Neurology Referral Dr. Alvy Bimler made back in July.   I sent POF and message to Scheduler to please f/u on this referral placed by Dr. Alvy Bimler on 7/26.

## 2014-12-02 ENCOUNTER — Other Ambulatory Visit: Payer: Self-pay | Admitting: Hematology and Oncology

## 2015-02-03 ENCOUNTER — Telehealth: Payer: Self-pay | Admitting: *Deleted

## 2015-02-03 NOTE — Telephone Encounter (Signed)
Daughter asked for contact information for Neurologist Dr. Alvy Bimler wanted pt to see.   Pt had to cancel her appt w/ Neuro d/t daughter was very busy taking care of some medical issues her son had.  Daughter ready to r/s the appt w/ Neurologist now.  Gave her number for Dr. Doristine Devoid office to call to r/s new pt appt.Carolyn Figueroa

## 2015-02-07 ENCOUNTER — Telehealth: Payer: Self-pay | Admitting: *Deleted

## 2015-02-07 NOTE — Telephone Encounter (Signed)
Faxed copy of referral to Dr Tat

## 2015-02-08 ENCOUNTER — Encounter: Payer: Self-pay | Admitting: Hematology and Oncology

## 2015-02-08 ENCOUNTER — Ambulatory Visit (HOSPITAL_BASED_OUTPATIENT_CLINIC_OR_DEPARTMENT_OTHER): Payer: Medicare Other | Admitting: Hematology and Oncology

## 2015-02-08 ENCOUNTER — Telehealth: Payer: Self-pay | Admitting: Hematology and Oncology

## 2015-02-08 ENCOUNTER — Other Ambulatory Visit (HOSPITAL_BASED_OUTPATIENT_CLINIC_OR_DEPARTMENT_OTHER): Payer: Medicare Other

## 2015-02-08 VITALS — BP 156/83 | HR 92 | Temp 98.5°F | Resp 18 | Ht 62.0 in | Wt 149.7 lb

## 2015-02-08 DIAGNOSIS — Z23 Encounter for immunization: Secondary | ICD-10-CM | POA: Diagnosis not present

## 2015-02-08 DIAGNOSIS — D45 Polycythemia vera: Secondary | ICD-10-CM | POA: Diagnosis present

## 2015-02-08 DIAGNOSIS — R251 Tremor, unspecified: Secondary | ICD-10-CM | POA: Diagnosis not present

## 2015-02-08 LAB — CBC WITH DIFFERENTIAL/PLATELET
BASO%: 0.6 % (ref 0.0–2.0)
Basophils Absolute: 0 10*3/uL (ref 0.0–0.1)
EOS%: 2.2 % (ref 0.0–7.0)
Eosinophils Absolute: 0.1 10*3/uL (ref 0.0–0.5)
HCT: 39.5 % (ref 34.8–46.6)
HGB: 12.8 g/dL (ref 11.6–15.9)
LYMPH%: 36.2 % (ref 14.0–49.7)
MCH: 32.2 pg (ref 25.1–34.0)
MCHC: 32.3 g/dL (ref 31.5–36.0)
MCV: 99.6 fL (ref 79.5–101.0)
MONO#: 0.4 10*3/uL (ref 0.1–0.9)
MONO%: 9.9 % (ref 0.0–14.0)
NEUT#: 2 10*3/uL (ref 1.5–6.5)
NEUT%: 51.1 % (ref 38.4–76.8)
Platelets: 275 10*3/uL (ref 145–400)
RBC: 3.97 10*6/uL (ref 3.70–5.45)
RDW: 14.9 % — ABNORMAL HIGH (ref 11.2–14.5)
WBC: 3.9 10*3/uL (ref 3.9–10.3)
lymph#: 1.4 10*3/uL (ref 0.9–3.3)

## 2015-02-08 MED ORDER — INFLUENZA VAC SPLIT QUAD 0.5 ML IM SUSY
0.5000 mL | PREFILLED_SYRINGE | Freq: Once | INTRAMUSCULAR | Status: AC
  Administered 2015-02-08: 0.5 mL via INTRAMUSCULAR
  Filled 2015-02-08: qty 0.5

## 2015-02-08 MED ORDER — HYDROXYUREA 500 MG PO CAPS: ORAL_CAPSULE | ORAL | Status: DC

## 2015-02-08 NOTE — Progress Notes (Signed)
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 The patient never had bone marrow aspirate and biopsy. She was started on hydroxyurea and dose was adjusted due to hair thinning and fatigue On 09/30/2013, hydroxyurea dose was increased to 1000 mg on Mondays, Wednesdays and Fridays and 2 take 500 mg the rest of the week. On 10/22/2013, the dose of hydroxyurea was increased to 1000 mg daily along with phlebotomy. On August 2015, the dose of hydroxyurea was modified to 1000 mg daily Mondays to Fridays and 500 mg on Saturdays and Sunday.  However, the patient was not following instruction correctly. On 07/20/2014, the dose of hydroxyurea was modifed to 500 mg daily Mondays to Fridays and 1000 mg on Saturdays and Sundays On 02/08/2015, the dose of hydroxyurea was modified to 500 milligrams daily Mondays to Saturdays and 1000 mg on Sundays.  INTERVAL HISTORY: Please see below for problem oriented charting. She is doing well. Denies recent infections. The patient denies any recent signs or symptoms of bleeding such as spontaneous epistaxis, hematuria or hematochezia. She continues to have tremors. Her daughter was unable to take her to the neurology office because of her son not feeling well. She wants another referral back to see neurologist.  REVIEW  OF SYSTEMS:   Constitutional: Denies fevers, chills or abnormal weight loss Eyes: Denies blurriness of vision Ears, nose, mouth, throat, and face: Denies mucositis or sore throat Respiratory: Denies cough, dyspnea or wheezes Cardiovascular: Denies palpitation, chest discomfort or lower extremity swelling Gastrointestinal:  Denies nausea, heartburn or change in bowel habits Skin: Denies abnormal skin rashes Lymphatics: Denies new lymphadenopathy or easy bruising Neurological:Denies numbness, tingling or new weaknesses Behavioral/Psych: Mood is stable, no new changes  All other systems were reviewed with the patient and are negative.  I have reviewed the past medical history, past surgical history, social history and family history with the patient and they are unchanged from previous note.  ALLERGIES:  has No Known Allergies.  MEDICATIONS:  Current Outpatient Prescriptions  Medication Sig Dispense Refill  . aspirin 81 MG tablet Take 81 mg by mouth daily.    . Biotin 1 MG CAPS Take 1 tablet by mouth daily.    . Cholecalciferol (VITAMIN D3) 2000 UNITS capsule Take by mouth.    . conjugated estrogens (PREMARIN) vaginal cream Place 0.5 g vaginally 3 (three) times a week.    . hydrochlorothiazide (HYDRODIURIL) 25 MG tablet Take 25 mg by mouth daily.    . hydroxyurea (HYDREA) 500 MG capsule Take 1 tablet daily except on Saturdays & Sundays take 2 tablets 100 capsule 5  . hydroxyurea (HYDREA) 500 MG capsule TAKE 2 CAPSULES BY MOUTH DAILY; MAY TAKE WITH FOOD TO MINIMIZE GI SIDE EFFECTS 100 capsule 1  . lisinopril (PRINIVIL,ZESTRIL) 20 MG tablet Take 20 mg by mouth.    . sertraline (ZOLOFT) 50 MG tablet      No current facility-administered medications for this visit.    PHYSICAL  EXAMINATION: ECOG PERFORMANCE STATUS: 0 - Asymptomatic  Filed Vitals:   02/08/15 1431  BP: 156/83  Pulse: 92  Temp: 98.5 F (36.9 C)  Resp: 18   Filed Weights   02/08/15 1431  Weight: 149 lb 11.2 oz  (67.903 kg)    GENERAL:alert, no distress and comfortable SKIN: skin color, texture, turgor are normal, no rashes or significant lesions EYES: normal, Conjunctiva are pink and non-injected, sclera clear OROPHARYNX:no exudate, no erythema and lips, buccal mucosa, and tongue normal  NECK: supple, thyroid normal size, non-tender, without nodularity LYMPH:  no palpable lymphadenopathy in the cervical, axillary or inguinal LUNGS: clear to auscultation and percussion with normal breathing effort HEART: regular rate & rhythm and no murmurs and no lower extremity edema ABDOMEN:abdomen soft, non-tender and normal bowel sounds Musculoskeletal:no cyanosis of digits and no clubbing  NEURO: alert & oriented x 3 with fluent speech, no focal motor/sensory deficits. Noted tremors  LABORATORY DATA:  I have reviewed the data as listed    Component Value Date/Time   NA 139 11/16/2013 1404   NA 137 11/14/2011 1345   K 4.0 11/16/2013 1404   K 3.9 11/14/2011 1345   CL 102 07/30/2012 1419   CL 99 11/14/2011 1345   CO2 28 11/16/2013 1404   CO2 30 11/14/2011 1345   GLUCOSE 89 11/16/2013 1404   GLUCOSE 102* 07/30/2012 1419   GLUCOSE 153* 11/14/2011 1345   BUN 30.0* 11/16/2013 1404   BUN 32* 11/14/2011 1345   CREATININE 0.9 11/16/2013 1404   CREATININE 1.17* 11/14/2011 1345   CALCIUM 9.1 11/16/2013 1404   CALCIUM 9.1 11/14/2011 1345   PROT 6.7 11/16/2013 1404   PROT 6.6 11/14/2011 1345   ALBUMIN 4.0 11/16/2013 1404   ALBUMIN 4.0 11/14/2011 1345   AST 15 11/16/2013 1404   AST 15 11/14/2011 1345   ALT <6 11/16/2013 1404   ALT 9 11/14/2011 1345   ALKPHOS 49 11/16/2013 1404   ALKPHOS 37* 11/14/2011 1345   BILITOT 1.45* 11/16/2013 1404   BILITOT 1.0 11/14/2011 1345   GFRNONAA 58* 11/22/2009 1855   GFRAA  11/22/2009 1855    >60        The eGFR has been calculated using the MDRD equation. This calculation has not been validated in all clinical situations. eGFR's persistently <60 mL/min  signify possible Chronic Kidney Disease.    No results found for: SPEP, UPEP  Lab Results  Component Value Date   WBC 3.9 02/08/2015   NEUTROABS 2.0 02/08/2015   HGB 12.8 02/08/2015   HCT 39.5 02/08/2015   MCV 99.6 02/08/2015   PLT 275 02/08/2015      Chemistry      Component Value Date/Time   NA 139 11/16/2013 1404   NA 137 11/14/2011 1345   K 4.0 11/16/2013 1404   K 3.9 11/14/2011 1345   CL 102 07/30/2012 1419   CL 99 11/14/2011 1345   CO2 28 11/16/2013 1404   CO2 30 11/14/2011 1345   BUN 30.0* 11/16/2013 1404   BUN 32* 11/14/2011 1345   CREATININE 0.9 11/16/2013 1404   CREATININE 1.17* 11/14/2011 1345      Component Value Date/Time   CALCIUM 9.1 11/16/2013 1404   CALCIUM 9.1 11/14/2011 1345   ALKPHOS 49 11/16/2013 1404   ALKPHOS 37* 11/14/2011 1345   AST 15 11/16/2013 1404   AST 15 11/14/2011 1345   ALT <6 11/16/2013 1404   ALT 9 11/14/2011 1345   BILITOT 1.45* 11/16/2013 1404   BILITOT 1.0  11/14/2011 1345      ASSESSMENT & PLAN:  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 She will continue treatment taking Hydroxyurea 500 mg daily except on Saturday and Sunday she take 1000 mg. With mild progressive changes with a CBC, I recommend changing hydroxyurea to 500 mg daily except on Sundays she takes 1000 mg. Plan to see her back in 5 months with repeat blood work, history and physical examination   Tremor Cause is unknown, much worse compared to prior visit.  Recent examination revealed mild cog-wheeling and I am wondering whether she is developing Parkinson's disease I suggested Neurology consultation and she agreed to proceed  Need for prophylactic vaccination and inoculation against influenza We discussed the importance of preventive care and reviewed the vaccination programs. She does not have any prior allergic reactions to influenza vaccination. She agrees to proceed with influenza vaccination today and we will  administer it today at the clinic.    Orders Placed This Encounter  Procedures  . Ambulatory referral to Neurology    Referral Priority:  Routine    Referral Type:  Consultation    Referral Reason:  Specialty Services Required    Requested Specialty:  Neurology    Number of Visits Requested:  1   All questions were answered. The patient knows to call the clinic with any problems, questions or concerns. No barriers to learning was detected. I spent 15 minutes counseling the patient face to face. The total time spent in the appointment was 20 minutes and more than 50% was on counseling and review of test results     [facility], [place], MD 02/08/2015 3:26 PM

## 2015-02-08 NOTE — Telephone Encounter (Signed)
per pof to sch pt appt-gave pt copy of avs-cld [facility] they stated referral in [facility] and will call pt to sch-adv pt

## 2015-02-08 NOTE — Assessment & Plan Note (Signed)
She is doing well and had been compliant with her treatment. She will continue treatment taking Hydroxyurea 500 mg daily except on Saturday and Sunday she take 1000 mg. With mild progressive changes with a CBC, I recommend changing hydroxyurea to 500 mg daily except on Sundays she takes 1000 mg. Plan to see her back in 5 months with repeat blood work, history and physical examination

## 2015-02-08 NOTE — Assessment & Plan Note (Signed)
We discussed the importance of preventive care and reviewed the vaccination programs. She does not have any prior allergic reactions to influenza vaccination. She agrees to proceed with influenza vaccination today and we will administer it today at the clinic.  

## 2015-02-08 NOTE — Assessment & Plan Note (Signed)
Cause is unknown, much worse compared to prior visit.  Recent examination revealed mild cog-wheeling and I am wondering whether she is developing Parkinson's disease I suggested Neurology consultation and she agreed to proceed

## 2015-02-25 ENCOUNTER — Ambulatory Visit: Payer: Medicare Other | Admitting: Neurology

## 2015-03-01 ENCOUNTER — Ambulatory Visit (INDEPENDENT_AMBULATORY_CARE_PROVIDER_SITE_OTHER): Payer: Medicare Other | Admitting: Neurology

## 2015-03-01 ENCOUNTER — Encounter: Payer: Self-pay | Admitting: Neurology

## 2015-03-01 VITALS — BP 132/90 | HR 96 | Ht 62.0 in | Wt 148.0 lb

## 2015-03-01 DIAGNOSIS — G20 Parkinson's disease: Secondary | ICD-10-CM | POA: Diagnosis not present

## 2015-03-01 MED ORDER — CARBIDOPA-LEVODOPA 25-100 MG PO TABS: 1.0000 | ORAL_TABLET | Freq: Three times a day (TID) | ORAL | Status: DC

## 2015-03-01 NOTE — Patient Instructions (Signed)
1. Start Carbidopa Levodopa as follows: 1/2 tab three times a day before meals x 1 wk, then 1/2 in am & noon & 1 in evening for a week, then 1/2 in am &1 at noon &one in evening for a week, then 1 tablet three times a day before meals. 2. We have scheduled you at [facility] for your MRI on 03/15/2015 at 11:00 am. Please arrive 15 minutes prior and go to 1st floor radiology. If you need to reschedule for any reason please call [phone].

## 2015-03-01 NOTE — Progress Notes (Signed)
Note routed to Dr Bouska.  

## 2015-03-01 NOTE — Progress Notes (Signed)
Carolyn Figueroa was seen today in the movement disorders clinic for neurologic consultation at the request of Dr. Alvy Bimler.  Her PCP is BOUSKA,DAVID E, MD.  The consultation is for the evaluation of tremor.  This patient is accompanied in the office by her child who supplements the history.  The records that were made available to me were reviewed.  Her records indicate that she has had tremor at least since August, 2015.  She was referred to my office earlier this year but was unable to make the appointment.  Tremor started in the right hand but recently migrated to the L hand; she initially didn't notice it at all and would deny it was happening but now she is noting trouble with putting spices in food because of tremor.  Her daughter sees tremor at rest and has for a while.  There is no fam hx of tremor.  She is R hand dominant.  She c/o that her strength is not as good and has trouble pouring a big pot of soup or water.   Specific Symptoms:  Tremor: Yes.   Voice: no change Sleep: sleeps well  Vivid Dreams:  Yes.    Acting out dreams:  Yes.   x 3 years (lived with daughter for last 3 years and her daughter hears her screaming) Wet Pillows: Yes.   Postural symptoms:  Yes.    Falls?  No. Bradykinesia symptoms: slow movements and difficulty getting out of a chair Loss of smell:  Yes.   Loss of taste:  No. Urinary Incontinence:  Yes.   Difficulty Swallowing:  No. Handwriting, micrographia: No. (it is more tremulous but not smaller) Trouble with ADL's:  No. (she is slower but able to do it)  Trouble buttoning clothing: No. (has hard time with hooking bra) Depression:  Yes.   (but better with zoloft) Memory changes:  No. Hallucinations:  No.  visual distortions: No. N/V:  No. Lightheaded:  No.  Syncope: No. Diplopia:  No. Dyskinesia:  No.  Neuroimaging has not previously been performed.    PREVIOUS MEDICATIONS: none to date  ALLERGIES:  No Known Allergies  CURRENT MEDICATIONS:    Outpatient Encounter Prescriptions as of 03/01/2015  Medication Sig  . aspirin 81 MG tablet Take 81 mg by mouth daily.  . Biotin 1 MG CAPS Take 1 tablet by mouth daily.  . Cholecalciferol (VITAMIN D3) 2000 UNITS capsule Take by mouth.  . hydrochlorothiazide (HYDRODIURIL) 25 MG tablet Take 25 mg by mouth daily.  . hydroxyurea (HYDREA) 500 MG capsule Take 1 tablet daily except on Saturdays & Sundays take 2 tablets  . lisinopril (PRINIVIL,ZESTRIL) 20 MG tablet Take 20 mg by mouth.  . sertraline (ZOLOFT) 50 MG tablet   . [DISCONTINUED] conjugated estrogens (PREMARIN) vaginal cream Place 0.5 g vaginally 3 (three) times a week.  . [DISCONTINUED] hydroxyurea (HYDREA) 500 MG capsule TAKE 2 CAPSULES BY MOUTH DAILY; MAY TAKE WITH FOOD TO MINIMIZE GI SIDE EFFECTS   No facility-administered encounter medications on file as of 03/01/2015.    PAST MEDICAL HISTORY:   Past Medical History  Diagnosis Date  . Depression with anxiety   . Polycythemia   . Thrombocytosis ([place])   . Vulvar candidiasis   . Insomnia   . Varicose vein   . Colon polyp 05/2003  . HTN (hypertension)   . Vitamin D deficiency   . 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 Need for prophylactic vaccination and inoculation against influenza 01/14/2013  . Weight loss 11/16/2013  .  Tremor 11/16/2013    PAST SURGICAL HISTORY:   Past Surgical History  Procedure Laterality Date  . No past surgeries      SOCIAL HISTORY:   Social History   Social History  . Marital Status: Married    Spouse Name: N/A  . Number of Children: 2  . Years of Education: N/A   Occupational History  .      Housewife   Social History Main Topics  . Smoking status: Never Smoker   . Smokeless tobacco: Never Used  . Alcohol Use: No  . Drug Use: No  . Sexual Activity: No   Other Topics Concern  . Not on file   Social History Narrative    FAMILY HISTORY:   Family Status  Relation Status Death Age  . Mother Deceased     HTN  . Father Deceased      mi, clotting disorder  . Sister Alive   . Brother Alive   . Paternal Uncle Deceased   . Brother Alive   . Brother Alive     ROS:  A complete 10 system review of systems was obtained and was unremarkable apart from what is mentioned above.  PHYSICAL EXAMINATION:    VITALS:   Filed Vitals:   03/01/15 0933  BP: 132/90  Pulse: 96  Height: 5\' 2"  (1.575 m)  Weight: 148 lb (67.132 kg)    GEN:  The patient appears stated age and is in NAD. HEENT:  Normocephalic, atraumatic.  The mucous membranes are moist. The superficial temporal arteries are without ropiness or tenderness. CV:  RRR Lungs:  CTAB Neck/HEME:  There are no carotid bruits bilaterally.  Neurological examination:  Orientation: The patient is alert and oriented x3. Fund of knowledge is appropriate.  Recent and remote memory are intact.  Attention and concentration are normal.    Able to name objects and repeat phrases. Cranial nerves: There is good facial symmetry.  There is facial hypomimia.  Pupils are equal round and reactive to light bilaterally. Fundoscopic exam is attempted but the disc margins are not well visualized bilaterally. Extraocular muscles are intact. The visual fields are full to confrontational testing. The speech is fluent and clear. Soft palate rises symmetrically and there is no tongue deviation. Hearing is intact to conversational tone. Sensation: Sensation is intact to light and pinprick throughout (facial, trunk, extremities). Vibration is decreased at the bilateral big toe. There is no extinction with double simultaneous stimulation. There is no sensory dermatomal level identified. Motor: Strength is 5/5 in the bilateral upper and lower extremities.   Shoulder shrug is equal and symmetric.  There is no pronator drift. Deep tendon reflexes: Deep tendon reflexes are 2-3/4 at the bilateral biceps, triceps, brachioradialis, patella and achilles. Plantar responses are downgoing bilaterally.  Movement  examination: Tone: There is moderate rigidity in the bilateral upper extremities.   Abnormal movements: There is a bilateral upper extremity resting tremor that greatly increases with distraction procedures.  There is an intermittent right lower extremity resting tremor that increases with distraction as well. Coordination:  There is  decremation with RAM's, with all forms of rapid alternating movements in the upper extremities, right greater than left.  Rapid alternating movements in the lower extremities were better than the upper extremities. Gait and Station: The patient has no difficulty arising out of a deep-seated chair without the use of the hands. The patient's stride length is slightly decreased, although she does not shuffle.  Pull test is attempted, but she  does not attempt to right herself.  I think this is more of a language barrier than true positive pull test as she makes no attempt to right herself and we tried several times  ASSESSMENT/PLAN:  1.  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  -We discussed the diagnosis as well as pathophysiology of the disease.  We discussed treatment options as well as prognostic indicators.  Patient education was provided.  -Greater than 50% of the 60 minute visit was spent in counseling answering questions and talking about what to expect now as well as in the future.  We talked about medication options as well as potential future surgical options.  We talked about safety in the home.  -We decided to add carbidopa/levodopa 25/100.  1/2 tab tid x 1 wk, then 1/2 in am & noon & 1 at night for a week, then 1/2 in am &1 at noon &night for a week, then 1 po tid.  Risks, benefits, side effects and alternative therapies were discussed.  The opportunity to ask questions was given and they were answered to the best of my ability.  The patient expressed understanding and willingness  to follow the outlined treatment protocols.  -I want to refer her for neuro rehabilitation therapies, but refused at this point in time.  -We discussed community resources in the area including patient support groups and community exercise programs for PD and pt education was provided to the patient.  -Talked extensively about the importance of safe, cardiovascular exercise.  -Given lack of neuroimaging previously and slight hyperreflexia, we will go ahead and do an MRI of the brain.  I did tell her that I fully expect to see cerebral small vessel disease and just want to make sure I am not missing anything else. 2.  I will plan on seeing the patient back in the next few months, sooner should new neurologic issues arise.

## 2015-03-09 ENCOUNTER — Telehealth: Payer: Self-pay | Admitting: Neurology

## 2015-03-09 NOTE — Telephone Encounter (Signed)
Left message on machine for patient to call back. To make her aware we need to cancel her MR due to insurance denying coverage. Awaiting call back.

## 2015-03-09 NOTE — Telephone Encounter (Signed)
Patient's daughter made aware insurance denied coverage of MR Brain. Appt cancelled.

## 2015-03-15 ENCOUNTER — Ambulatory Visit (HOSPITAL_COMMUNITY): Payer: Medicare Other

## 2015-05-31 ENCOUNTER — Ambulatory Visit (INDEPENDENT_AMBULATORY_CARE_PROVIDER_SITE_OTHER): Payer: Medicare Other | Admitting: Neurology

## 2015-05-31 ENCOUNTER — Encounter: Payer: Self-pay | Admitting: Neurology

## 2015-05-31 VITALS — BP 130/92 | HR 84 | Ht 62.0 in | Wt 145.0 lb

## 2015-05-31 DIAGNOSIS — G20 Parkinson's disease: Secondary | ICD-10-CM | POA: Diagnosis not present

## 2015-05-31 DIAGNOSIS — R292 Abnormal reflex: Secondary | ICD-10-CM

## 2015-05-31 MED ORDER — CARBIDOPA-LEVODOPA 25-100 MG PO TABS: 1.0000 | ORAL_TABLET | Freq: Four times a day (QID) | ORAL | Status: DC

## 2015-05-31 NOTE — Progress Notes (Signed)
Carolyn Figueroa was seen today in the movement disorders clinic for neurologic consultation at the request of Dr. Alvy Bimler.  Her PCP is BOUSKA,DAVID E, MD.  The consultation is for the evaluation of tremor.  This patient is accompanied in the office by her child who supplements the history.  The records that were made available to me were reviewed.  Her records indicate that she has had tremor at least since August, 2015.  She was referred to my office earlier this year but was unable to make the appointment.  Tremor started in the right hand but recently migrated to the L hand; she initially didn't notice it at all and would deny it was happening but now she is noting trouble with putting spices in food because of tremor.  Her daughter sees tremor at rest and has for a while.  There is no fam hx of tremor.  She is R hand dominant.  She c/o that her strength is not as good and has trouble pouring a big pot of soup or water.  05/31/15 update:  The patient follows up today, accompanied by her daughter who supplements the history.  She started on levodopa last visit.  She denies any falls since last visit. States that she is walking in her neighborhood faithfully for exercise.  Having much less tremor and overall feeling better.  No hallucinations.  No lightheadedness or near syncope.  She refused Parkinson's therapies.  Her insurance denied her MRI of the brain.  Neuroimaging has not previously been performed.    PREVIOUS MEDICATIONS: none to date  ALLERGIES:  No Known Allergies  CURRENT MEDICATIONS:  Outpatient Encounter Prescriptions as of 05/31/2015  Medication Sig  . aspirin 81 MG tablet Take 81 mg by mouth daily.  . Biotin 1 MG CAPS Take 1 tablet by mouth daily.  . carbidopa-levodopa (SINEMET IR) 25-100 MG tablet Take 1 tablet by mouth 3 (three) times daily.  . Cholecalciferol (VITAMIN D3) 2000 UNITS capsule Take by mouth.  . hydrochlorothiazide (HYDRODIURIL) 25 MG tablet Take 25 mg by mouth  daily.  . hydroxyurea (HYDREA) 500 MG capsule Take 1 tablet daily except on Saturdays & Sundays take 2 tablets  . lisinopril (PRINIVIL,ZESTRIL) 20 MG tablet Take 20 mg by mouth.  . sertraline (ZOLOFT) 50 MG tablet    No facility-administered encounter medications on file as of 05/31/2015.    PAST MEDICAL HISTORY:   Past Medical History  Diagnosis Date  . Depression with anxiety   . Polycythemia   . Thrombocytosis ([place])   . Vulvar candidiasis   . Insomnia   . Varicose vein   . Colon polyp 05/2003  . HTN (hypertension)   . Vitamin D deficiency   . 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 Need for prophylactic vaccination and inoculation against influenza 01/14/2013  . Weight loss 11/16/2013  . Tremor 11/16/2013    PAST SURGICAL HISTORY:   Past Surgical History  Procedure Laterality Date  . No past surgeries      SOCIAL HISTORY:   Social History   Social History  . Marital Status: Married    Spouse Name: N/A  . Number of Children: 2  . Years of Education: N/A   Occupational History  .      Housewife   Social History Main Topics  . Smoking status: Never Smoker   . Smokeless tobacco: Never Used  . Alcohol Use: No  . Drug Use: No  . Sexual Activity: No   Other Topics  Concern  . Not on file   Social History Narrative    FAMILY HISTORY:   Family Status  Relation Status Death Age  . Mother Deceased     HTN  . Father Deceased     mi, clotting disorder  . Sister Alive   . Brother Alive   . Paternal Uncle Deceased   . Brother Alive   . Brother Alive     ROS:  A complete 10 system review of systems was obtained and was unremarkable apart from what is mentioned above.  PHYSICAL EXAMINATION:    VITALS:   Filed Vitals:   05/31/15 1508  BP: 130/92  Pulse: 84  Height: 5\' 2"  (1.575 m)  Weight: 145 lb (65.772 kg)    GEN:  The patient appears stated age and is in NAD. HEENT:  Normocephalic, atraumatic.  The mucous membranes are moist. The superficial temporal  arteries are without ropiness or tenderness. CV:  RRR Lungs:  CTAB Neck/HEME:  There are no carotid bruits bilaterally.  Neurological examination:  Orientation: The patient is alert and oriented x3.  Cranial nerves: There is good facial symmetry.  There is facial hypomimia.   The speech is fluent and clear. Soft palate rises symmetrically and there is no tongue deviation. Hearing is intact to conversational tone. Sensation: Sensation is intact to light touch throughout Motor: Strength is 5/5 in the bilateral upper and lower extremities.   Shoulder shrug is equal and symmetric.  There is no pronator drift. Deep tendon reflexes: Deep tendon reflexes are 2-3/4 at the bilateral biceps, triceps, brachioradialis, patella and achilles. Plantar responses are downgoing bilaterally.  Movement examination: Tone: There is minimal rigidity in the bilateral UE Abnormal movements: There is a very minor and rare UE tremor bilaterally that is greatly improved from previous Coordination:  There is no decremation with any form of RAMS, including alternating supination and pronation of the forearm, hand opening and closing, finger taps, heel taps and toe taps. Gait and Station: The patient has no difficulty arising out of a deep-seated chair without the use of the hands. The patient's stride length is slightly normal.  Pull test is attempted, but she does not attempt to right herself. She does have decreased arm swing bilaterally, more on the right than the left.  ASSESSMENT/PLAN:  1.  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  -She is markedly improved on carbidopa/levodopa 25/100 tid.    -She is going to [place] soon and told her that she could take an extra dose of carbidopa/levodopa 25/100 as needed and wrote her medication to accomodate this while she is gone.  -Has refused neurorehab but is walking at home and feeling  better. 2.  Hyperreflexia   -insurance denied MRI brain 3.  I will plan on seeing the patient back in the next few months, sooner should new neurologic issues arise. Much greater than 50% of this visit was spent in counseling with the patient and the family.  Total face to face time:  25 min

## 2015-07-19 ENCOUNTER — Other Ambulatory Visit (HOSPITAL_BASED_OUTPATIENT_CLINIC_OR_DEPARTMENT_OTHER): Payer: Medicare Other

## 2015-07-19 ENCOUNTER — Encounter: Payer: Self-pay | Admitting: Hematology and Oncology

## 2015-07-19 ENCOUNTER — Telehealth: Payer: Self-pay | Admitting: Hematology and Oncology

## 2015-07-19 ENCOUNTER — Ambulatory Visit (HOSPITAL_BASED_OUTPATIENT_CLINIC_OR_DEPARTMENT_OTHER): Payer: Medicare Other | Admitting: Hematology and Oncology

## 2015-07-19 VITALS — BP 153/79 | HR 84 | Temp 98.5°F | Resp 18 | Ht 62.0 in | Wt 144.7 lb

## 2015-07-19 DIAGNOSIS — D45 Polycythemia vera: Secondary | ICD-10-CM

## 2015-07-19 DIAGNOSIS — G20 Parkinson's disease: Secondary | ICD-10-CM | POA: Insufficient documentation

## 2015-07-19 DIAGNOSIS — I10 Essential (primary) hypertension: Secondary | ICD-10-CM | POA: Diagnosis not present

## 2015-07-19 LAB — CBC WITH DIFFERENTIAL/PLATELET
BASO%: 0.5 % (ref 0.0–2.0)
Basophils Absolute: 0 10*3/uL (ref 0.0–0.1)
EOS%: 2.5 % (ref 0.0–7.0)
Eosinophils Absolute: 0.1 10*3/uL (ref 0.0–0.5)
HCT: 42 % (ref 34.8–46.6)
HGB: 13.3 g/dL (ref 11.6–15.9)
LYMPH%: 37.3 % (ref 14.0–49.7)
MCH: 30.7 pg (ref 25.1–34.0)
MCHC: 31.6 g/dL (ref 31.5–36.0)
MCV: 97.1 fL (ref 79.5–101.0)
MONO#: 0.4 10*3/uL (ref 0.1–0.9)
MONO%: 7.8 % (ref 0.0–14.0)
NEUT#: 2.7 10*3/uL (ref 1.5–6.5)
NEUT%: 51.9 % (ref 38.4–76.8)
Platelets: 380 10*3/uL (ref 145–400)
RBC: 4.32 10*6/uL (ref 3.70–5.45)
RDW: 15.3 % — ABNORMAL HIGH (ref 11.2–14.5)
WBC: 5.2 10*3/uL (ref 3.9–10.3)
lymph#: 1.9 10*3/uL (ref 0.9–3.3)

## 2015-07-19 MED ORDER — HYDROXYUREA 500 MG PO CAPS: ORAL_CAPSULE | ORAL | Status: DC

## 2015-07-19 NOTE — Assessment & Plan Note (Signed)
she will continue current medical management. I recommend close follow-up with primary care doctor for medication adjustment.  

## 2015-07-19 NOTE — Assessment & Plan Note (Signed)
She is doing well and had been compliant with her treatment. She will continue treatment taking Hydroxyurea 500 mg daily except on Sunday she takes 1000 mg. Her blood counts are very stable and I recommend she continues the same along with aspirin therapy. Plan to see her back in 6 months with repeat blood work, history and physical examination 

## 2015-07-19 NOTE — Assessment & Plan Note (Signed)
She is currently on treatment and follows with neurologist closely. Overall, she felt that her tremors have improved. I would defer to them for further management 

## 2015-07-19 NOTE — Telephone Encounter (Signed)
Gave pt appt for oct & avs °

## 2015-07-19 NOTE — Progress Notes (Signed)
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 The patient never had bone marrow aspirate and biopsy. She was started on hydroxyurea and dose was adjusted due to hair thinning and fatigue On 09/30/2013, hydroxyurea dose was increased to 1000 mg on Mondays, Wednesdays and Fridays and 2 take 500 mg the rest of the week. On 10/22/2013, the dose of hydroxyurea was increased to 1000 mg daily along with phlebotomy. On August 2015, the dose of hydroxyurea was modified to 1000 mg daily Mondays to Fridays and 500 mg on Saturdays and Sunday.  However, the patient was not following instruction correctly. On 07/20/2014, the dose of hydroxyurea was modifed to 500 mg daily Mondays to Fridays and 1000 mg on Saturdays and Sundays On 02/08/2015, the dose of hydroxyurea was modified to 500 milligrams daily Mondays to Saturdays and 1000 mg on Sundays. She was referred to neurologist in 2016 and was diagnosed with Parkinson's disease  INTERVAL HISTORY: Please see below for problem oriented charting. She feels well. Denies recent infection. No new diagnosis of blood clot. Her tremors are better. Denies recent falls.  REVIEW OF SYSTEMS:   Constitutional: Denies fevers, chills or abnormal weight loss Eyes: Denies blurriness of vision Ears, nose, mouth, throat,  and face: Denies mucositis or sore throat Respiratory: Denies cough, dyspnea or wheezes Cardiovascular: Denies palpitation, chest discomfort or lower extremity swelling Gastrointestinal:  Denies nausea, heartburn or change in bowel habits Skin: Denies abnormal skin rashes Lymphatics: Denies new lymphadenopathy or easy bruising Neurological:Denies numbness, tingling or new weaknesses Behavioral/Psych: Mood is stable, no new changes  All other systems were reviewed with the patient and are negative.  I have reviewed the past medical history, past surgical history, social history and family history with the patient and they are unchanged from previous note.  ALLERGIES:  has No Known Allergies.  MEDICATIONS:  Current Outpatient Prescriptions  Medication Sig Dispense Refill  . aspirin 81 MG tablet Take 81 mg by mouth daily.    . Biotin 1 MG CAPS Take 1 tablet by mouth daily.    . carbidopa-levodopa (SINEMET IR) 25-100 MG tablet Take 1 tablet by mouth 4 (four) times daily. (Patient taking differently: Take 1 tablet by mouth 3 (three) times daily. ) 360 tablet 1  . Cholecalciferol (VITAMIN D3) 2000 UNITS capsule Take by mouth.    . hydroxyurea (HYDREA) 500 MG capsule Take 1 tablet daily except on Sundays take 2 tablets 100 capsule 5  . lisinopril (PRINIVIL,ZESTRIL) 20 MG tablet Take 20 mg by mouth.    . sertraline (ZOLOFT) 50 MG tablet      No current facility-administered medications for this visit.    PHYSICAL EXAMINATION: ECOG PERFORMANCE STATUS: 1 - Symptomatic but completely ambulatory  Filed Vitals:   07/19/15 1432  BP: 153/79  Pulse: 84  Temp: 98.5 F (36.9 C)  Resp: 18   Filed Weights   07/19/15 1432  Weight: 144 lb 11.2 oz (65.635 kg)  GENERAL:alert, no distress and comfortable SKIN: skin color, texture, turgor are normal, no rashes or significant lesions EYES: normal, Conjunctiva are pink and non-injected, sclera clear OROPHARYNX:no exudate, no erythema and lips,  buccal mucosa, and tongue normal  NECK: supple, thyroid normal size, non-tender, without nodularity LYMPH:  no palpable lymphadenopathy in the cervical, axillary or inguinal LUNGS: clear to auscultation and percussion with normal breathing effort HEART: regular rate & rhythm and no murmurs and no lower extremity edema ABDOMEN:abdomen soft, non-tender and normal bowel sounds Musculoskeletal:no cyanosis of digits and no clubbing  NEURO: alert & oriented x 3 with fluent speech, no focal motor/sensory deficits. Noted mild tremors  LABORATORY DATA:  I have reviewed the data as listed    Component Value Date/Time   NA 139 11/16/2013 1404   NA 137 11/14/2011 1345   K 4.0 11/16/2013 1404   K 3.9 11/14/2011 1345   CL 102 07/30/2012 1419   CL 99 11/14/2011 1345   CO2 28 11/16/2013 1404   CO2 30 11/14/2011 1345   GLUCOSE 89 11/16/2013 1404   GLUCOSE 102* 07/30/2012 1419   GLUCOSE 153* 11/14/2011 1345   BUN 30.0* 11/16/2013 1404   BUN 32* 11/14/2011 1345   CREATININE 0.9 11/16/2013 1404   CREATININE 1.17* 11/14/2011 1345   CALCIUM 9.1 11/16/2013 1404   CALCIUM 9.1 11/14/2011 1345   PROT 6.7 11/16/2013 1404   PROT 6.6 11/14/2011 1345   ALBUMIN 4.0 11/16/2013 1404   ALBUMIN 4.0 11/14/2011 1345   AST 15 11/16/2013 1404   AST 15 11/14/2011 1345   ALT <6 11/16/2013 1404   ALT 9 11/14/2011 1345   ALKPHOS 49 11/16/2013 1404   ALKPHOS 37* 11/14/2011 1345   BILITOT 1.45* 11/16/2013 1404   BILITOT 1.0 11/14/2011 1345   GFRNONAA 58* 11/22/2009 1855   GFRAA  11/22/2009 1855    >60        The eGFR has been calculated using the MDRD equation. This calculation has not been validated in all clinical situations. eGFR's persistently <60 mL/min signify possible Chronic Kidney Disease.    No results found for: SPEP, UPEP  Lab Results  Component Value Date   WBC 5.2 07/19/2015   NEUTROABS 2.7 07/19/2015   HGB 13.3 07/19/2015   HCT 42.0 07/19/2015   MCV 97.1 07/19/2015   PLT 380  07/19/2015      Chemistry      Component Value Date/Time   NA 139 11/16/2013 1404   NA 137 11/14/2011 1345   K 4.0 11/16/2013 1404   K 3.9 11/14/2011 1345   CL 102 07/30/2012 1419   CL 99 11/14/2011 1345   CO2 28 11/16/2013 1404   CO2 30 11/14/2011 1345   BUN 30.0* 11/16/2013 1404   BUN 32* 11/14/2011 1345   CREATININE 0.9 11/16/2013 1404   CREATININE 1.17* 11/14/2011 1345      Component Value Date/Time   CALCIUM 9.1 11/16/2013 1404   CALCIUM 9.1 11/14/2011 1345   ALKPHOS 49 11/16/2013 1404   ALKPHOS 37* 11/14/2011 1345   AST 15 11/16/2013 1404   AST 15 11/14/2011 1345   ALT <6 11/16/2013 1404   ALT 9 11/14/2011 1345   BILITOT 1.45* 11/16/2013 1404   BILITOT 1.0 11/14/2011 1345     ASSESSMENT & PLAN:  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 She will continue treatment taking Hydroxyurea 500 mg daily except on Sunday she takes 1000 mg. Her blood counts are very stable and I  recommend she continues the same along with aspirin therapy. Plan to see her back in 6 months with repeat blood work, history and physical examination  Essential hypertension she will continue current medical management. I recommend close follow-up with primary care doctor for medication adjustment.   Parkinson disease [facility]) She is currently on treatment and follows with neurologist closely. Overall, she felt that her tremors have improved. I would defer to them for further management   No orders of the defined types were placed in this encounter.   All questions were answered. The patient knows to call the clinic with any problems, questions or concerns. No barriers to learning was detected. I spent 15 minutes counseling the patient face to face. The total time spent in the appointment was 20 minutes and more than 50% was on counseling and review of test results     [facility], [place], MD 07/19/2015 2:59 PM

## 2015-09-02 ENCOUNTER — Other Ambulatory Visit: Payer: Self-pay | Admitting: Family Medicine

## 2015-09-02 DIAGNOSIS — Z1231 Encounter for screening mammogram for malignant neoplasm of breast: Secondary | ICD-10-CM

## 2015-09-16 ENCOUNTER — Ambulatory Visit
Admission: RE | Admit: 2015-09-16 | Discharge: 2015-09-16 | Disposition: A | Discharge status: Home / Self Care | Payer: Medicare Other | Source: Ambulatory Visit | Attending: Family Medicine | Admitting: Family Medicine

## 2015-09-16 DIAGNOSIS — Z1231 Encounter for screening mammogram for malignant neoplasm of breast: Secondary | ICD-10-CM

## 2015-09-30 ENCOUNTER — Ambulatory Visit (INDEPENDENT_AMBULATORY_CARE_PROVIDER_SITE_OTHER): Payer: Medicare Other | Admitting: Neurology

## 2015-09-30 ENCOUNTER — Encounter: Payer: Self-pay | Admitting: Neurology

## 2015-09-30 VITALS — BP 100/60 | HR 95 | Ht 62.0 in | Wt 144.0 lb

## 2015-09-30 DIAGNOSIS — G20 Parkinson's disease: Secondary | ICD-10-CM | POA: Diagnosis not present

## 2015-09-30 NOTE — Progress Notes (Signed)
Carolyn Figueroa was seen today in the movement disorders clinic for neurologic consultation at the request of Dr. Alvy Bimler.  Her PCP is BOUSKA,DAVID E, MD.  The consultation is for the evaluation of tremor.  This patient is accompanied in the office by her child who supplements the history.  The records that were made available to me were reviewed.  Her records indicate that she has had tremor at least since August, 2015.  She was referred to my office earlier this year but was unable to make the appointment.  Tremor started in the right hand but recently migrated to the L hand; she initially didn't notice it at all and would deny it was happening but now she is noting trouble with putting spices in food because of tremor.  Her daughter sees tremor at rest and has for a while.  There is no fam hx of tremor.  She is R hand dominant.  She c/o that her strength is not as good and has trouble pouring a big pot of soup or water.  05/31/15 update:  The patient follows up today, accompanied by her daughter who supplements the history.  She started on levodopa last visit.  She denies any falls since last visit. States that she is walking in her neighborhood faithfully for exercise.  Having much less tremor and overall feeling better.  No hallucinations.  No lightheadedness or near syncope.  She refused Parkinson's therapies.  Her insurance denied her MRI of the brain.  09/30/15 update:  The patient follows up today.  She is accompanied by her daughter supplements the history.  I have reviewed previous records since our last visit.  She saw Dr. Alvy Bimler such and her polycythemia has been stable.  She remains on carbidopa/levodopa 25/100, one tablet 3 times per day.  She had 1 fall on vacation while walking down a steep incline to the beach.  No lightheadedness or near syncope.  No hallucinations.  She did go to [place] since our last visit and had a great time.  Having cough.  Wonders if from PD vs  lisinopril.  Neuroimaging has not previously been performed.    PREVIOUS MEDICATIONS: none to date  ALLERGIES:  No Known Allergies  CURRENT MEDICATIONS:  Outpatient Encounter Prescriptions as of 09/30/2015  Medication Sig  . aspirin 81 MG tablet Take 81 mg by mouth daily.  . carbidopa-levodopa (SINEMET IR) 25-100 MG tablet Take 1 tablet by mouth 4 (four) times daily. (Patient taking differently: Take 1 tablet by mouth 3 (three) times daily. )  . Cholecalciferol (VITAMIN D3) 2000 UNITS capsule Take by mouth.  . hydroxyurea (HYDREA) 500 MG capsule Take 1 tablet daily except on Sundays take 2 tablets  . lisinopril (PRINIVIL,ZESTRIL) 20 MG tablet Take 20 mg by mouth.  . sertraline (ZOLOFT) 50 MG tablet   . [DISCONTINUED] Biotin 1 MG CAPS Take 1 tablet by mouth daily.   No facility-administered encounter medications on file as of 09/30/2015.    PAST MEDICAL HISTORY:   Past Medical History  Diagnosis Date  . Depression with anxiety   . Polycythemia   . Thrombocytosis ([place])   . Vulvar candidiasis   . Insomnia   . Varicose vein   . Colon polyp 05/2003  . HTN (hypertension)   . Vitamin D deficiency   . 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 Need for prophylactic vaccination and inoculation against influenza 01/14/2013  . Weight loss 11/16/2013  . Tremor 11/16/2013    PAST SURGICAL HISTORY:  Past Surgical History  Procedure Laterality Date  . No past surgeries      SOCIAL HISTORY:   Social History   Social History  . Marital Status: Married    Spouse Name: N/A  . Number of Children: 2  . Years of Education: N/A   Occupational History  .      Housewife   Social History Main Topics  . Smoking status: Never Smoker   . Smokeless tobacco: Never Used  . Alcohol Use: No  . Drug Use: No  . Sexual Activity: No   Other Topics Concern  . Not on file   Social History Narrative    FAMILY HISTORY:   Family Status  Relation Status Death Age  . Mother Deceased     HTN  .  Father Deceased     mi, clotting disorder  . Sister Alive   . Brother Alive   . Paternal Uncle Deceased   . Brother Alive   . Brother Alive     ROS:  A complete 10 system review of systems was obtained and was unremarkable apart from what is mentioned above.  PHYSICAL EXAMINATION:    VITALS:   Filed Vitals:   09/30/15 1508  BP: 100/60  Pulse: 95  Height: 5\' 2"  (1.575 m)  Weight: 144 lb (65.318 kg)    GEN:  The patient appears stated age and is in NAD. HEENT:  Normocephalic, atraumatic.  The mucous membranes are moist. The superficial temporal arteries are without ropiness or tenderness. CV:  RRR Lungs:  CTAB Neck/HEME:  There are no carotid bruits bilaterally.  Neurological examination:  Orientation: The patient is alert and oriented x3.  Cranial nerves: There is good facial symmetry.  There is facial hypomimia.   The speech is fluent and clear. Soft palate rises symmetrically and there is no tongue deviation. Hearing is intact to conversational tone. Sensation: Sensation is intact to light touch throughout Motor: Strength is 5/5 in the bilateral upper and lower extremities.   Shoulder shrug is equal and symmetric.  There is no pronator drift. Deep tendon reflexes: Deep tendon reflexes are 2-3/4 at the bilateral biceps, triceps, brachioradialis, patella and achilles. Plantar responses are downgoing bilaterally.  Movement examination: Tone: There is hint of rigidity in the RUE Abnormal movements: There is a very minor and rare UE tremor in the right thumb. Coordination:  There is no decremation with any form of RAMS, including alternating supination and pronation of the forearm, hand opening and closing, finger taps, heel taps and toe taps. Gait and Station: The patient has no difficulty arising out of a deep-seated chair without the use of the hands. The patient's stride length is normal.   ASSESSMENT/PLAN:  1.  Idiopathic Parkinson's disease, diagnosed 03/01/15, although  she likely has had the disease at least since the summer of 2015.  -She is markedly improved on carbidopa/levodopa 25/100 tid.    -Form filled out for the [employer] class.  Her son plans to drive from [place] and take her 1 time per week.  -Has refused neurorehab but is walking at home and feeling better. 2.  Hyperreflexia   -insurance denied MRI brain 3.  I will plan on seeing the patient back in the next 4 months, sooner should new neurologic issues arise.

## 2015-11-30 DIAGNOSIS — Z9181 History of falling: Secondary | ICD-10-CM | POA: Insufficient documentation

## 2016-01-17 ENCOUNTER — Telehealth: Payer: Self-pay | Admitting: Hematology and Oncology

## 2016-01-17 ENCOUNTER — Encounter: Payer: Self-pay | Admitting: Hematology and Oncology

## 2016-01-17 ENCOUNTER — Other Ambulatory Visit (HOSPITAL_BASED_OUTPATIENT_CLINIC_OR_DEPARTMENT_OTHER): Payer: Medicare Other

## 2016-01-17 ENCOUNTER — Ambulatory Visit (HOSPITAL_BASED_OUTPATIENT_CLINIC_OR_DEPARTMENT_OTHER): Payer: Medicare Other | Admitting: Hematology and Oncology

## 2016-01-17 DIAGNOSIS — I10 Essential (primary) hypertension: Secondary | ICD-10-CM | POA: Diagnosis not present

## 2016-01-17 DIAGNOSIS — G20 Parkinson's disease: Secondary | ICD-10-CM

## 2016-01-17 DIAGNOSIS — D45 Polycythemia vera: Secondary | ICD-10-CM

## 2016-01-17 LAB — CBC WITH DIFFERENTIAL/PLATELET
BASO%: 0.4 % (ref 0.0–2.0)
Basophils Absolute: 0 10*3/uL (ref 0.0–0.1)
EOS%: 2.2 % (ref 0.0–7.0)
Eosinophils Absolute: 0.1 10*3/uL (ref 0.0–0.5)
HCT: 42.7 % (ref 34.8–46.6)
HGB: 13.6 g/dL (ref 11.6–15.9)
LYMPH%: 31.8 % (ref 14.0–49.7)
MCH: 30.9 pg (ref 25.1–34.0)
MCHC: 31.9 g/dL (ref 31.5–36.0)
MCV: 97 fL (ref 79.5–101.0)
MONO#: 0.4 10*3/uL (ref 0.1–0.9)
MONO%: 7.5 % (ref 0.0–14.0)
NEUT#: 3.1 10*3/uL (ref 1.5–6.5)
NEUT%: 58.1 % (ref 38.4–76.8)
Platelets: 348 10*3/uL (ref 145–400)
RBC: 4.4 10*6/uL (ref 3.70–5.45)
RDW: 14.9 % — ABNORMAL HIGH (ref 11.2–14.5)
WBC: 5.3 10*3/uL (ref 3.9–10.3)
lymph#: 1.7 10*3/uL (ref 0.9–3.3)

## 2016-01-17 NOTE — Assessment & Plan Note (Signed)
she will continue current medical management. I recommend close follow-up with primary care doctor for medication adjustment.  

## 2016-01-17 NOTE — Progress Notes (Signed)
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 The patient never had bone marrow aspirate and biopsy. She was started on hydroxyurea and dose was adjusted due to hair thinning and fatigue On 09/30/2013, hydroxyurea dose was increased to 1000 mg on Mondays, Wednesdays and Fridays and 2 take 500 mg the rest of the week. On 10/22/2013, the dose of hydroxyurea was increased to 1000 mg daily along with phlebotomy. On August 2015, the dose of hydroxyurea was modified to 1000 mg daily Mondays to Fridays and 500 mg on Saturdays and Sunday.  However, the patient was not following instruction correctly. On 07/20/2014, the dose of hydroxyurea was modifed to 500 mg daily Mondays to Fridays and 1000 mg on Saturdays and Sundays On 02/08/2015, the dose of hydroxyurea was modified to 500 milligrams daily Mondays to Saturdays and 1000 mg on Sundays. She was referred to neurologist in 2016 and was diagnosed with Parkinson's disease  INTERVAL HISTORY: Please see below for problem oriented charting. She feels well. Denies recent infection. No new diagnosis of blood clot. Her tremors are better. Denies recent falls. She is up-to-date with influenza vaccination. She is compliant taking her medications as prescribed  REVIEW OF SYSTEMS:   Constitutional: Denies  fevers, chills or abnormal weight loss Eyes: Denies blurriness of vision Ears, nose, mouth, throat, and face: Denies mucositis or sore throat Respiratory: Denies cough, dyspnea or wheezes Cardiovascular: Denies palpitation, chest discomfort or lower extremity swelling Gastrointestinal:  Denies nausea, heartburn or change in bowel habits Skin: Denies abnormal skin rashes Lymphatics: Denies new lymphadenopathy or easy bruising Neurological:Denies numbness, tingling or new weaknesses Behavioral/Psych: Mood is stable, no new changes  All other systems were reviewed with the patient and are negative.  I have reviewed the past medical history, past surgical history, social history and family history with the patient and they are unchanged from previous note.  ALLERGIES:  has No Known Allergies.  MEDICATIONS:  Current Outpatient Prescriptions  Medication Sig Dispense Refill  . aspirin 81 MG tablet Take 81 mg by mouth daily.    . carbidopa-levodopa (SINEMET IR) 25-100 MG tablet Take 1 tablet by mouth 4 (four) times daily. (Patient taking differently: Take 1 tablet by mouth 3 (three) times daily. ) 360 tablet 1  . Cholecalciferol (VITAMIN D3) 2000 UNITS capsule Take by mouth.    . hydroxyurea (HYDREA) 500 MG capsule Take 1 tablet daily except on Sundays take 2 tablets 100 capsule 5  . lisinopril (PRINIVIL,ZESTRIL) 20 MG tablet Take 20 mg by mouth.    . sertraline (ZOLOFT) 50 MG tablet      No current facility-administered medications for this visit.     PHYSICAL EXAMINATION: ECOG PERFORMANCE STATUS: 0 - Asymptomatic  Vitals:   01/17/16 1322  BP: (!) 171/90  Pulse: 72  Resp: 18  Temp: 97.7 F (36.5 C)   Filed Weights   01/17/16 1322  Weight: 143 lb 9.6 oz (65.1 kg)    GENERAL:alert,  no distress and comfortable SKIN: skin color, texture, turgor are normal, no rashes or significant lesions EYES: normal, Conjunctiva are pink and non-injected, sclera clear OROPHARYNX:no exudate, no  erythema and lips, buccal mucosa, and tongue normal  NECK: supple, thyroid normal size, non-tender, without nodularity LYMPH:  no palpable lymphadenopathy in the cervical, axillary or inguinal LUNGS: clear to auscultation and percussion with normal breathing effort HEART: regular rate & rhythm and no murmurs and no lower extremity edema ABDOMEN:abdomen soft, non-tender and normal bowel sounds Musculoskeletal:no cyanosis of digits and no clubbing  NEURO: alert & oriented x 3 with fluent speech, no focal motor/sensory deficits  LABORATORY DATA:  I have reviewed the data as listed    Component Value Date/Time   NA 139 11/16/2013 1404   K 4.0 11/16/2013 1404   CL 102 07/30/2012 1419   CO2 28 11/16/2013 1404   GLUCOSE 89 11/16/2013 1404   GLUCOSE 102 (H) 07/30/2012 1419   BUN 30.0 (H) 11/16/2013 1404   CREATININE 0.9 11/16/2013 1404   CALCIUM 9.1 11/16/2013 1404   PROT 6.7 11/16/2013 1404   ALBUMIN 4.0 11/16/2013 1404   AST 15 11/16/2013 1404   ALT <6 11/16/2013 1404   ALKPHOS 49 11/16/2013 1404   BILITOT 1.45 (H) 11/16/2013 1404   GFRNONAA 58 (L) 11/22/2009 1855   GFRAA  11/22/2009 1855    >60        The eGFR has been calculated using the MDRD equation. This calculation has not been validated in all clinical situations. eGFR's persistently <60 mL/min signify possible Chronic Kidney Disease.    No results found for: SPEP, UPEP  Lab Results  Component Value Date   WBC 5.3 01/17/2016   NEUTROABS 3.1 01/17/2016   HGB 13.6 01/17/2016   HCT 42.7 01/17/2016   MCV 97.0 01/17/2016   PLT 348 01/17/2016      Chemistry      Component Value Date/Time   NA 139 11/16/2013 1404   K 4.0 11/16/2013 1404   CL 102 07/30/2012 1419   CO2 28 11/16/2013 1404   BUN 30.0 (H) 11/16/2013 1404   CREATININE 0.9 11/16/2013 1404      Component Value Date/Time   CALCIUM 9.1 11/16/2013 1404   ALKPHOS 49 11/16/2013 1404   AST 15 11/16/2013 1404   ALT <6 11/16/2013 1404   BILITOT 1.45 (H)  11/16/2013 1404     ASSESSMENT & PLAN:  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 She will continue treatment taking Hydroxyurea 500 mg daily except on Sunday she takes 1000 mg. Her blood counts are very stable and I recommend she continues the same along with aspirin therapy. Plan to see her back in 6 months with repeat blood work, history and physical examination  Essential hypertension she will continue current medical management. I recommend close follow-up with primary care doctor for medication adjustment.   Parkinson disease ([place]) Her tremor has improved significantly while on treatment. I would defer to her neurologist for further management   No orders of the defined types were placed in this encounter.  All questions were answered. The patient knows to call the clinic with any problems, questions or concerns. No barriers to learning was detected. I spent 15 minutes counseling the patient face to face. The total time spent in the appointment was 20 minutes and more than 50% was on counseling and review of test results     Heath Lark, MD 01/17/2016 2:09 PM

## 2016-01-17 NOTE — Assessment & Plan Note (Signed)
She is doing well and had been compliant with her treatment. She will continue treatment taking Hydroxyurea 500 mg daily except on Sunday she takes 1000 mg. Her blood counts are very stable and I recommend she continues the same along with aspirin therapy. Plan to see her back in 6 months with repeat blood work, history and physical examination

## 2016-01-17 NOTE — Assessment & Plan Note (Signed)
Her tremor has improved significantly while on treatment. I would defer to her neurologist for further management 

## 2016-01-17 NOTE — Telephone Encounter (Signed)
Appointments scheduled per 10/24 LOS. Patient given AVS report and future scheduled appointments.

## 2016-01-24 ENCOUNTER — Other Ambulatory Visit: Payer: Medicare Other

## 2016-01-24 ENCOUNTER — Ambulatory Visit: Payer: Medicare Other | Admitting: Hematology and Oncology

## 2016-02-03 ENCOUNTER — Ambulatory Visit: Payer: Medicare Other | Admitting: Neurology

## 2016-02-09 ENCOUNTER — Ambulatory Visit: Payer: Medicare Other | Admitting: Neurology

## 2016-02-10 ENCOUNTER — Ambulatory Visit: Payer: Medicare Other | Admitting: Neurology

## 2016-02-19 NOTE — Progress Notes (Signed)
Carolyn Figueroa was seen today in the movement disorders clinic for neurologic consultation at the request of Dr. Alvy Bimler.  Her PCP is BOUSKA,DAVID E, MD.  The consultation is for the evaluation of tremor.  This patient is accompanied in the office by her child who supplements the history.  The records that were made available to me were reviewed.  Her records indicate that she has had tremor at least since August, 2015.  She was referred to my office earlier this year but was unable to make the appointment.  Tremor started in the right hand but recently migrated to the L hand; she initially didn't notice it at all and would deny it was happening but now she is noting trouble with putting spices in food because of tremor.  Her daughter sees tremor at rest and has for a while.  There is no fam hx of tremor.  She is R hand dominant.  She c/o that her strength is not as good and has trouble pouring a big pot of soup or water.  05/31/15 update:  The patient follows up today, accompanied by her daughter who supplements the history.  She started on levodopa last visit.  She denies any falls since last visit. States that she is walking in her neighborhood faithfully for exercise.  Having much less tremor and overall feeling better.  No hallucinations.  No lightheadedness or near syncope.  She refused Parkinson's therapies.  Her insurance denied her MRI of the brain.  09/30/15 update:  The patient follows up today.  She is accompanied by her daughter supplements the history.  I have reviewed previous records since our last visit.  She saw Dr. Alvy Bimler such and her polycythemia has been stable.  She remains on carbidopa/levodopa 25/100, one tablet 3 times per day.  She had 1 fall on vacation while walking down a steep incline to the beach.  No lightheadedness or near syncope.  No hallucinations.  She did go to [place] since our last visit and had a great time.  Having cough.  Wonders if from PD vs lisinopril.  02/21/16  update:  Pt f/u today, accompanied by her daughter who supplements the hx.  She is on carbidopa/levodopa 25/100 tid and doing well.  Pt denies falls.  Pt denies lightheadedness, near syncope.  No hallucinations.  Mood has been good.  She is doing exercise at the [facility] 3 times a week with the Parkinson's biking program.  Has noticed some stiffness of the right arm.  Neuroimaging has not previously been performed.    PREVIOUS MEDICATIONS: none to date  ALLERGIES:  No Known Allergies  CURRENT MEDICATIONS:  Outpatient Encounter Prescriptions as of 02/21/2016  Medication Sig  . aspirin 81 MG tablet Take 81 mg by mouth daily.  . carbidopa-levodopa (SINEMET IR) 25-100 MG tablet Take 1.5 tablets by mouth 3 (three) times daily.  . Cholecalciferol (VITAMIN D3) 2000 UNITS capsule Take by mouth.  . hydroxyurea (HYDREA) 500 MG capsule Take 1 tablet daily except on Sundays take 2 tablets  . lisinopril (PRINIVIL,ZESTRIL) 20 MG tablet Take 20 mg by mouth.  . sertraline (ZOLOFT) 50 MG tablet   . [DISCONTINUED] carbidopa-levodopa (SINEMET IR) 25-100 MG tablet Take 1 tablet by mouth 4 (four) times daily. (Patient taking differently: Take 1 tablet by mouth 3 (three) times daily. )   No facility-administered encounter medications on file as of 02/21/2016.     PAST MEDICAL HISTORY:   Past Medical History:  Diagnosis Date  .  Colon polyp 05/2003  . Depression with anxiety   . HTN (hypertension)   . Insomnia   . Need for prophylactic vaccination and inoculation against influenza 01/14/2013  . Polycythemia   . 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 Thrombocytosis ([place])   . Tremor 11/16/2013  . Varicose vein   . Vitamin D deficiency   . Vulvar candidiasis   . Weight loss 11/16/2013    PAST SURGICAL HISTORY:   Past Surgical History:  Procedure Laterality Date  . NO PAST SURGERIES      SOCIAL HISTORY:   Social History   Social History  . Marital status: Married    Spouse name: N/A  .  Number of children: 2  . Years of education: N/A   Occupational History  .  Unemployed    Housewife   Social History Main Topics  . Smoking status: Never Smoker  . Smokeless tobacco: Never Used  . Alcohol use No  . Drug use: No  . Sexual activity: No   Other Topics Concern  . Not on file   Social History Narrative  . No narrative on file    FAMILY HISTORY:   Family Status  Relation Status  . Mother Deceased   HTN  . Father Deceased   mi, clotting disorder  . Sister Alive  . Brother Alive  . Paternal Uncle Deceased  . Brother Alive  . Brother Alive    ROS:  A complete 10 system review of systems was obtained and was unremarkable apart from what is mentioned above.  PHYSICAL EXAMINATION:    VITALS:   Vitals:   02/21/16 1513  BP: (!) 154/80  Pulse: 92  Weight: 145 lb (65.8 kg)  Height: 5\' 2"  (1.575 m)    GEN:  The patient appears stated age and is in NAD. HEENT:  Normocephalic, atraumatic.  The mucous membranes are moist. The superficial temporal arteries are without ropiness or tenderness. CV:  RRR Lungs:  CTAB Neck/HEME:  There are no carotid bruits bilaterally.  Neurological examination:  Orientation: The patient is alert and oriented x3.  Cranial nerves: There is good facial symmetry.  There is facial hypomimia.   The speech is fluent and clear. Soft palate rises symmetrically and there is no tongue deviation. Hearing is intact to conversational tone. Sensation: Sensation is intact to light touch throughout Motor: Strength is 5/5 in the bilateral upper and lower extremities.   Shoulder shrug is equal and symmetric.  There is no pronator drift.  Movement examination: Tone: There is mild to moderate increased tone in the right upper extremity and mild increased tone in the left upper extremity. Abnormal movements: There is a very minor tremor in both upper extremities. Coordination:  There is no decremation with any form of RAMS, including alternating  supination and pronation of the forearm, hand opening and closing, finger taps, heel taps and toe taps. Gait and Station: The patient has no difficulty arising out of a deep-seated chair without the use of the hands. The patient's stride length is normal.   ASSESSMENT/PLAN:  1.  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  -She is becoming more rigid again and I recommended increasing her carbidopa/levodopa 25/100 to 1-1/2 tablets 3 times per day.  -Congratulated the patient as she is doing a significant amount of exercise through the [facility] with the Parkinson's program. 2.  Hyperreflexia   -insurance denied MRI brain 3.  I  will plan on seeing the patient back in the next 4 months, sooner should new neurologic issues arise.  Much greater than 50% of this visit was spent in counseling and coordinating care.  Total face to face time:  25 min

## 2016-02-21 ENCOUNTER — Encounter: Payer: Self-pay | Admitting: Neurology

## 2016-02-21 ENCOUNTER — Ambulatory Visit (INDEPENDENT_AMBULATORY_CARE_PROVIDER_SITE_OTHER): Payer: Medicare Other | Admitting: Neurology

## 2016-02-21 VITALS — BP 154/80 | HR 92 | Ht 62.0 in | Wt 145.0 lb

## 2016-02-21 DIAGNOSIS — G20 Parkinson's disease: Secondary | ICD-10-CM

## 2016-02-21 MED ORDER — CARBIDOPA-LEVODOPA 25-100 MG PO TABS: 1.5000 | ORAL_TABLET | Freq: Three times a day (TID) | ORAL | 1 refills | Status: DC

## 2016-02-21 NOTE — Patient Instructions (Signed)
Increase your carbidopa/levodopa 25/100 to 1.5 tablets three times per day, 30 minutes prior to the meals Happy holidays! I'm proud of you for all of your exercise!

## 2016-05-28 NOTE — Progress Notes (Addendum)
Carolyn Figueroa was seen today in the movement disorders clinic for neurologic consultation at the request of Dr. Alvy Bimler.  Her PCP is BOUSKA,DAVID E, MD.  The consultation is for the evaluation of tremor.  This patient is accompanied in the office by her child who supplements the history.  The records that were made available to me were reviewed.  Her records indicate that she has had tremor at least since August, 2015.  She was referred to my office earlier this year but was unable to make the appointment.  Tremor started in the right hand but recently migrated to the L hand; she initially didn't notice it at all and would deny it was happening but now she is noting trouble with putting spices in food because of tremor.  Her daughter sees tremor at rest and has for a while.  There is no fam hx of tremor.  She is R hand dominant.  She c/o that her strength is not as good and has trouble pouring a big pot of soup or water.  05/31/15 update:  The patient follows up today, accompanied by her daughter who supplements the history.  She started on levodopa last visit.  She denies any falls since last visit. States that she is walking in her neighborhood faithfully for exercise.  Having much less tremor and overall feeling better.  No hallucinations.  No lightheadedness or near syncope.  She refused Parkinson's therapies.  Her insurance denied her MRI of the brain.  09/30/15 update:  The patient follows up today.  She is accompanied by her daughter supplements the history.  I have reviewed previous records since our last visit.  She saw Dr. Alvy Bimler such and her polycythemia has been stable.  She remains on carbidopa/levodopa 25/100, one tablet 3 times per day.  She had 1 fall on vacation while walking down a steep incline to the beach.  No lightheadedness or near syncope.  No hallucinations.  She did go to [place] since our last visit and had a great time.  Having cough.  Wonders if from PD vs lisinopril.  02/21/16  update:  Pt f/u today, accompanied by her daughter who supplements the hx.  She is on carbidopa/levodopa 25/100 tid and doing well.  Pt denies falls.  Pt denies lightheadedness, near syncope.  No hallucinations.  Mood has been good.  She is doing exercise at the [facility] 3 times a week with the Parkinson's biking program.  Has noticed some stiffness of the right arm.  06/05/16 update:  Patient follows up today, accompanied by her daughter who supplements the history.  Last visit, I recommended that she increase her carbidopa/levodopa 25/100 to a total of 1-1/2 tablets 3 times per day.  She reports that she has been doing well with this dose.  She is exercising with the Parkinson's bicycle program.  She is doing better with the rigidity in her arm.  She is not falling.  She denies lightheadedness or near syncope.  No hallucinations.  She does act out the dreams.  She doesn't fall out of the bed.  Noting some increased drooling.  Neuroimaging has not previously been performed.    PREVIOUS MEDICATIONS: none to date  ALLERGIES:  No Known Allergies  CURRENT MEDICATIONS:  Outpatient Encounter Prescriptions as of 06/05/2016  Medication Sig  . aspirin 81 MG tablet Take 81 mg by mouth daily.  . carbidopa-levodopa (SINEMET IR) 25-100 MG tablet Take 1.5 tablets by mouth 3 (three) times daily.  Marland Kitchen  Cholecalciferol (VITAMIN D3) 2000 UNITS capsule Take by mouth.  . hydroxyurea (HYDREA) 500 MG capsule Take 1 tablet daily except on Sundays take 2 tablets  . lisinopril (PRINIVIL,ZESTRIL) 20 MG tablet Take 20 mg by mouth.  . sertraline (ZOLOFT) 50 MG tablet    No facility-administered encounter medications on file as of 06/05/2016.     PAST MEDICAL HISTORY:   Past Medical History:  Diagnosis Date  . Colon polyp 05/2003  . Depression with anxiety   . HTN (hypertension)   . Insomnia   . Need for prophylactic vaccination and inoculation against influenza 01/14/2013  . Polycythemia   . Polycythemia  vera(238.4) 08/15/2011  . Thrombocytosis ([place])   . Tremor 11/16/2013  . Varicose vein   . Vitamin D deficiency   . Vulvar candidiasis   . Weight loss 11/16/2013    PAST SURGICAL HISTORY:   Past Surgical History:  Procedure Laterality Date  . NO PAST SURGERIES      SOCIAL HISTORY:   Social History   Social History  . Marital status: Married    Spouse name: N/A  . Number of children: 2  . Years of education: N/A   Occupational History  .  Unemployed    Housewife   Social History Main Topics  . Smoking status: Never Smoker  . Smokeless tobacco: Never Used  . Alcohol use No  . Drug use: No  . Sexual activity: No   Other Topics Concern  . Not on file   Social History Narrative  . No narrative on file    FAMILY HISTORY:   Family Status  Relation Status  . Mother Deceased   HTN  . Father Deceased   mi, clotting disorder  . Sister Alive  . Brother Alive  . Paternal Uncle Deceased  . Brother Alive  . Brother Alive    ROS:  A complete 10 system review of systems was obtained and was unremarkable apart from what is mentioned above.  PHYSICAL EXAMINATION:    VITALS:   Vitals:   06/05/16 1515  BP: 130/84  Pulse: 86  SpO2: 97%  Weight: 143 lb (64.9 kg)  Height: 5\' 2"  (1.575 m)    GEN:  The patient appears stated age and is in NAD. HEENT:  Normocephalic, atraumatic.  The mucous membranes are moist. The superficial temporal arteries are without ropiness or tenderness. CV:  RRR Lungs:  CTAB Neck/HEME:  There are no carotid bruits bilaterally.  Neurological examination:  Orientation: The patient is alert and oriented x3.  Cranial nerves: There is good facial symmetry.  There is facial hypomimia.   The speech is fluent and clear. Soft palate rises symmetrically and there is no tongue deviation. Hearing is intact to conversational tone. Sensation: Sensation is intact to light touch throughout Motor: Strength is 5/5 in the bilateral upper and lower  extremities.   Shoulder shrug is equal and symmetric.  There is no pronator drift.  Movement examination: Tone: There is minimal rigidity in the UE bilaterally Abnormal movements: There is a very minor tremor in RUE Coordination:  There is no decremation with any form of RAMS, including alternating supination and pronation of the forearm, hand opening and closing, finger taps, heel taps and toe taps. Gait and Station: The patient has no difficulty arising out of a deep-seated chair without the use of the hands. The patient's stride length is normal. There is reemergent tremor on the right with ambulation.  ASSESSMENT/PLAN:  1.  Idiopathic Parkinson's disease, diagnosed  03/01/15, although she likely has had the disease at least since the summer of 2015.  -Continue carbidopa/levodopa 25/100 1-1/2 tablets 3 times per day.  -Congratulated the patient as she is doing a significant amount of exercise through the [facility] with the Parkinson's program.  Also doing yoga  -pt/daughter asked several questions re: disease progression and answered them to best of my ability 2.  Hyperreflexia   -insurance denied MRI brain 3.  RBD  -slightly increased sx's but decided to hold on meds for now 4.  Sialorrhea  -This is commonly associated with PD.  We talked about treatments.  The patient is not a candidate for oral anticholinergic therapy because of increased risk of confusion and falls.  We discussed myobloc and 1% atropine drops.  We discusssed that candy like lemon drops can help by stimulating mm of the oropharynx to induce swallowing.  She doesn't want any medication right now. 5.  I will plan on seeing the patient back in the next 4 months, sooner should new neurologic issues arise.  Much greater than 50% of this visit was spent in counseling and coordinating care.  Total face to face time:  25 min

## 2016-06-05 ENCOUNTER — Ambulatory Visit (INDEPENDENT_AMBULATORY_CARE_PROVIDER_SITE_OTHER): Payer: Medicare Other | Admitting: Neurology

## 2016-06-05 ENCOUNTER — Encounter: Payer: Self-pay | Admitting: Neurology

## 2016-06-05 VITALS — BP 130/84 | HR 86 | Ht 62.0 in | Wt 143.0 lb

## 2016-06-05 DIAGNOSIS — K117 Disturbances of salivary secretion: Secondary | ICD-10-CM

## 2016-06-05 DIAGNOSIS — G20 Parkinson's disease: Secondary | ICD-10-CM | POA: Diagnosis not present

## 2016-06-05 DIAGNOSIS — G4752 REM sleep behavior disorder: Secondary | ICD-10-CM

## 2016-07-17 ENCOUNTER — Other Ambulatory Visit (HOSPITAL_BASED_OUTPATIENT_CLINIC_OR_DEPARTMENT_OTHER): Payer: Medicare Other

## 2016-07-17 ENCOUNTER — Telehealth: Payer: Self-pay | Admitting: Hematology and Oncology

## 2016-07-17 ENCOUNTER — Encounter: Payer: Self-pay | Admitting: Hematology and Oncology

## 2016-07-17 ENCOUNTER — Other Ambulatory Visit: Payer: Self-pay | Admitting: Hematology and Oncology

## 2016-07-17 ENCOUNTER — Ambulatory Visit (HOSPITAL_BASED_OUTPATIENT_CLINIC_OR_DEPARTMENT_OTHER): Payer: Medicare Other | Admitting: Hematology and Oncology

## 2016-07-17 DIAGNOSIS — D45 Polycythemia vera: Secondary | ICD-10-CM

## 2016-07-17 DIAGNOSIS — I10 Essential (primary) hypertension: Secondary | ICD-10-CM | POA: Diagnosis not present

## 2016-07-17 DIAGNOSIS — G20 Parkinson's disease: Secondary | ICD-10-CM

## 2016-07-17 LAB — CBC WITH DIFFERENTIAL/PLATELET
BASO%: 0.5 % (ref 0.0–2.0)
Basophils Absolute: 0 10*3/uL (ref 0.0–0.1)
EOS%: 3.9 % (ref 0.0–7.0)
Eosinophils Absolute: 0.2 10*3/uL (ref 0.0–0.5)
HCT: 45.9 % (ref 34.8–46.6)
HGB: 14.7 g/dL (ref 11.6–15.9)
LYMPH%: 25.6 % (ref 14.0–49.7)
MCH: 30.9 pg (ref 25.1–34.0)
MCHC: 32 g/dL (ref 31.5–36.0)
MCV: 96.4 fL (ref 79.5–101.0)
MONO#: 0.4 10*3/uL (ref 0.1–0.9)
MONO%: 7 % (ref 0.0–14.0)
NEUT#: 3.9 10*3/uL (ref 1.5–6.5)
NEUT%: 63 % (ref 38.4–76.8)
Platelets: 439 10*3/uL — ABNORMAL HIGH (ref 145–400)
RBC: 4.76 10*6/uL (ref 3.70–5.45)
RDW: 14.7 % — ABNORMAL HIGH (ref 11.2–14.5)
WBC: 6.2 10*3/uL (ref 3.9–10.3)
lymph#: 1.6 10*3/uL (ref 0.9–3.3)
nRBC: 0 % (ref 0–0)

## 2016-07-17 NOTE — Progress Notes (Signed)
[facility] OFFICE PROGRESS NOTE  Patient Care Team: Bernerd Limbo, MD as PCP - General (Family Medicine) Bernerd Limbo, MD as Attending Physician (Family Medicine) Christy Sartorius, MD as Referring Physician (Urology) Avel Sensor, MD as Attending Physician (Obstetrics and Gynecology) Tammy Arn Medal, MD as Referring Physician (Family Medicine) Heath Lark, MD as Consulting Physician (Hematology and Oncology) Eustace Quail Tat, DO as Consulting Physician (Neurology)  SUMMARY OF ONCOLOGIC HISTORY:  This patient was discovered to have myeloproliferative disorder after routine blood work revealed elevated hemoglobin and platelet. 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 The patient never had bone marrow aspirate and biopsy. She was started on hydroxyurea and dose was adjusted due to hair thinning and fatigue On 09/30/2013, hydroxyurea dose was increased to 1000 mg on Mondays, Wednesdays and Fridays and 2 take 500 mg the rest of the week. On 10/22/2013, the dose of hydroxyurea was increased to 1000 mg daily along with phlebotomy. On August 2015, the dose of hydroxyurea was modified to 1000 mg daily Mondays to Fridays and 500 mg on Saturdays and Sunday.  However, the patient was not following instruction correctly. On 07/20/2014, the dose of hydroxyurea was modifed to 500 mg daily Mondays to Fridays and 1000 mg on Saturdays and Sundays On 02/08/2015, the dose of hydroxyurea was modified to 500 milligrams daily Mondays to Saturdays and 1000 mg on Sundays. She was referred to neurologist in 2016 and was diagnosed with Parkinson's disease On 07/17/2016, the dose of hydroxyurea is adjusted.  She takes 500 mg daily except for 2 days a week on Wednesdays and Sundays she take 1000 mg INTERVAL HISTORY: Please see below for problem oriented charting. She is doing well. She is on medication for Parkinson's disease Denies recent infection She is compliant taking all the  medications as directed No recent diagnosis of blood clots REVIEW OF SYSTEMS:   Constitutional: Denies fevers, chills or abnormal weight loss Eyes: Denies blurriness of vision Ears, nose, mouth, throat, and face: Denies mucositis or sore throat Respiratory: Denies cough, dyspnea or wheezes Cardiovascular: Denies palpitation, chest discomfort or lower extremity swelling Gastrointestinal:  Denies nausea, heartburn or change in bowel habits Skin: Denies abnormal skin rashes Lymphatics: Denies new lymphadenopathy or easy bruising Neurological:Denies numbness, tingling or new weaknesses Behavioral/Psych: Mood is stable, no new changes  All other systems were reviewed with the patient and are negative.  I have reviewed the past medical history, past surgical history, social history and family history with the patient and they are unchanged from previous note.  ALLERGIES:  has No Known Allergies.  MEDICATIONS:  Current Outpatient Prescriptions  Medication Sig Dispense Refill  . aspirin 81 MG tablet Take 81 mg by mouth daily.    . carbidopa-levodopa (SINEMET IR) 25-100 MG tablet Take 1.5 tablets by mouth 3 (three) times daily. 405 tablet 1  . Cholecalciferol (VITAMIN D3) 2000 UNITS capsule Take by mouth.    . hydroxyurea (HYDREA) 500 MG capsule Take 1 tablet daily except on Sundays take 2 tablets 100 capsule 5  . lisinopril (PRINIVIL,ZESTRIL) 20 MG tablet Take 20 mg by mouth.    . sertraline (ZOLOFT) 50 MG tablet      No current facility-administered medications for this visit.     PHYSICAL EXAMINATION: ECOG PERFORMANCE STATUS: 0 - Asymptomatic  Vitals:   07/17/16 1413  BP: (!) 141/88  Pulse: 95  Resp: 18  Temp: 98.3 F (36.8 C)   Filed Weights   07/17/16 1413  Weight: 142  lb 11.2 oz (64.7 kg)    GENERAL:alert, no distress and comfortable SKIN: skin color, texture, turgor are normal, no rashes or significant lesions EYES: normal, Conjunctiva are pink and non-injected, sclera  clear OROPHARYNX:no exudate, no erythema and lips, buccal mucosa, and tongue normal  NECK: supple, thyroid normal size, non-tender, without nodularity LYMPH:  no palpable lymphadenopathy in the cervical, axillary or inguinal LUNGS: clear to auscultation and percussion with normal breathing effort HEART: regular rate & rhythm and no murmurs and no lower extremity edema ABDOMEN:abdomen soft, non-tender and normal bowel sounds Musculoskeletal:no cyanosis of digits and no clubbing  NEURO: alert & oriented x 3 with fluent speech, no focal motor/sensory deficits  LABORATORY DATA:  I have reviewed the data as listed    Component Value Date/Time   NA 139 11/16/2013 1404   K 4.0 11/16/2013 1404   CL 102 07/30/2012 1419   CO2 28 11/16/2013 1404   GLUCOSE 89 11/16/2013 1404   GLUCOSE 102 (H) 07/30/2012 1419   BUN 30.0 (H) 11/16/2013 1404   CREATININE 0.9 11/16/2013 1404   CALCIUM 9.1 11/16/2013 1404   PROT 6.7 11/16/2013 1404   ALBUMIN 4.0 11/16/2013 1404   AST 15 11/16/2013 1404   ALT <6 11/16/2013 1404   ALKPHOS 49 11/16/2013 1404   BILITOT 1.45 (H) 11/16/2013 1404   GFRNONAA 58 (L) 11/22/2009 1855   GFRAA  11/22/2009 1855    >60        The eGFR has been calculated using the MDRD equation. This calculation has not been validated in all clinical situations. eGFR's persistently <60 mL/min signify possible Chronic Kidney Disease.    No results found for: SPEP, UPEP  Lab Results  Component Value Date   WBC 6.2 07/17/2016   NEUTROABS 3.9 07/17/2016   HGB 14.7 07/17/2016   HCT 45.9 07/17/2016   MCV 96.4 07/17/2016   PLT 439 (H) 07/17/2016      Chemistry      Component Value Date/Time   NA 139 11/16/2013 1404   K 4.0 11/16/2013 1404   CL 102 07/30/2012 1419   CO2 28 11/16/2013 1404   BUN 30.0 (H) 11/16/2013 1404   CREATININE 0.9 11/16/2013 1404      Component Value Date/Time   CALCIUM 9.1 11/16/2013 1404   ALKPHOS 49 11/16/2013 1404   AST 15 11/16/2013 1404   ALT  <6 11/16/2013 1404   BILITOT 1.45 (H) 11/16/2013 1404      ASSESSMENT & PLAN:  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 I would defer to her neurologist for further management  Essential hypertension she will continue current medical management. I recommend close follow-up with primary care doctor for medication adjustment.    No orders of the defined types were placed in this encounter.  All questions were answered. The patient knows to call the clinic with any problems, questions or concerns. No barriers to learning was detected. I spent 10 minutes counseling the patient face to face. The total time spent in the appointment was 15 minutes and more than 50% was on counseling and review of test results     Heath Lark, MD 07/17/2016 2:27 PM

## 2016-07-17 NOTE — Telephone Encounter (Signed)
Appointments scheduled per 07/17/16 los. Patient was given a copy of the AVS report and appointment schedule per 07/17/16 los. °

## 2016-07-17 NOTE — Assessment & Plan Note (Signed)
She is noted to have mild progressive thrombocytosis with mild polycythemia Recommend adjusting the dose of hydroxyurea She would take 500 mg daily except on Wednesdays and Sundays she take 1000 mg I plan to see her back in 3 months She is reminded to take aspirin therapy

## 2016-07-17 NOTE — Assessment & Plan Note (Signed)
Her tremor has improved significantly while on treatment. I would defer to her neurologist for further management

## 2016-07-17 NOTE — Assessment & Plan Note (Signed)
she will continue current medical management. I recommend close follow-up with primary care doctor for medication adjustment.  

## 2016-07-30 ENCOUNTER — Telehealth: Payer: Self-pay | Admitting: *Deleted

## 2016-07-30 NOTE — Telephone Encounter (Signed)
Second call received by operator with this request.  Appointment has been changed.

## 2016-07-30 NOTE — Telephone Encounter (Signed)
"  I'm calling to reschedule my mom's appointment to August 21 st.  We need the same times lab 2:15 pm, Dr. Alvy Bimler at 2:45 pm.  I can only bring her after I get off work is why we need this time of day.  My return number is [phone]."   Will notify provider to confirm okay to schedule within the new patient slot.  Anita ventilated about problems scheduling.

## 2016-09-05 ENCOUNTER — Other Ambulatory Visit: Payer: Self-pay | Admitting: Family Medicine

## 2016-09-05 DIAGNOSIS — Z1231 Encounter for screening mammogram for malignant neoplasm of breast: Secondary | ICD-10-CM

## 2016-09-24 NOTE — Progress Notes (Signed)
Carolyn Figueroa was seen today in the movement disorders clinic for neurologic consultation at the request of Dr. Alvy Bimler.  Her PCP is Bernerd Limbo, MD.  The consultation is for the evaluation of tremor.  This patient is accompanied in the office by her child who supplements the history.  The records that were made available to me were reviewed.  Her records indicate that she has had tremor at least since August, 2015.  She was referred to my office earlier this year but was unable to make the appointment.  Tremor started in the right hand but recently migrated to the L hand; she initially didn't notice it at all and would deny it was happening but now she is noting trouble with putting spices in food because of tremor.  Her daughter sees tremor at rest and has for a while.  There is no fam hx of tremor.  She is R hand dominant.  She c/o that her strength is not as good and has trouble pouring a big pot of soup or water.  05/31/15 update:  The patient follows up today, accompanied by her daughter who supplements the history.  She started on levodopa last visit.  She denies any falls since last visit. States that she is walking in her neighborhood faithfully for exercise.  Having much less tremor and overall feeling better.  No hallucinations.  No lightheadedness or near syncope.  She refused Parkinson's therapies.  Her insurance denied her MRI of the brain.  09/30/15 update:  The patient follows up today.  She is accompanied by her daughter supplements the history.  I have reviewed previous records since our last visit.  She saw Dr. Alvy Bimler such and her polycythemia has been stable.  She remains on carbidopa/levodopa 25/100, one tablet 3 times per day.  She had 1 fall on vacation while walking down a steep incline to the beach.  No lightheadedness or near syncope.  No hallucinations.  She did go to [place] since our last visit and had a great time.  Having cough.  Wonders if from PD vs lisinopril.  02/21/16  update:  Pt f/u today, accompanied by her daughter who supplements the hx.  She is on carbidopa/levodopa 25/100 tid and doing well.  Pt denies falls.  Pt denies lightheadedness, near syncope.  No hallucinations.  Mood has been good.  She is doing exercise at the [facility] 3 times a week with the Parkinson's biking program.  Has noticed some stiffness of the right arm.  06/05/16 update:  Patient follows up today, accompanied by her daughter who supplements the history.  Last visit, I recommended that she increase her carbidopa/levodopa 25/100 to a total of 1-1/2 tablets 3 times per day.  She reports that she has been doing well with this dose.  She is exercising with the Parkinson's bicycle program.  She is doing better with the rigidity in her arm.  She is not falling.  She denies lightheadedness or near syncope.  No hallucinations.  She does act out the dreams.  She doesn't fall out of the bed.  Noting some increased drooling.  09/25/16 update:  Pt seen in f/u.  This patient is accompanied in the office by her daughter who supplements the history.  She is on carbidopa/levodopa 25/100, 1.5 tablets tid.  She is doing well, without SE.  Pt had one fall when she tripped over mini trampoline of her grandchild and fell and hit shoulder.  No LOC.  Pt denies  lightheadedness, near syncope.  No hallucinations.  Mood has been good.  Neuroimaging has not previously been performed.    PREVIOUS MEDICATIONS: none to date  ALLERGIES:  No Known Allergies  CURRENT MEDICATIONS:  Outpatient Encounter Prescriptions as of 09/25/2016  Medication Sig  . aspirin 81 MG tablet Take 81 mg by mouth daily.  . carbidopa-levodopa (SINEMET IR) 25-100 MG tablet Take 1.5 tablets by mouth 3 (three) times daily.  . Cholecalciferol (VITAMIN D3) 2000 UNITS capsule Take by mouth.  . hydroxyurea (HYDREA) 500 MG capsule Take 1 tablet daily except on Sundays take 2 tablets (Patient taking differently: Take 1 tablet daily except on  Sunday/Wednesday take 2 tablets)  . lisinopril (PRINIVIL,ZESTRIL) 20 MG tablet Take 20 mg by mouth.  . sertraline (ZOLOFT) 50 MG tablet    No facility-administered encounter medications on file as of 09/25/2016.     PAST MEDICAL HISTORY:   Past Medical History:  Diagnosis Date  . Colon polyp 05/2003  . Depression with anxiety   . HTN (hypertension)   . Insomnia   . Need for prophylactic vaccination and inoculation against influenza 01/14/2013  . Polycythemia   . 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 Thrombocytosis ([place])   . Tremor 11/16/2013  . Varicose vein   . Vitamin D deficiency   . Vulvar candidiasis   . Weight loss 11/16/2013    PAST SURGICAL HISTORY:   Past Surgical History:  Procedure Laterality Date  . NO PAST SURGERIES      SOCIAL HISTORY:   Social History   Social History  . Marital status: Married    Spouse name: N/A  . Number of children: 2  . Years of education: N/A   Occupational History  .  Unemployed    Housewife   Social History Main Topics  . Smoking status: Never Smoker  . Smokeless tobacco: Never Used  . Alcohol use No  . Drug use: No  . Sexual activity: No   Other Topics Concern  . Not on file   Social History Narrative  . No narrative on file    FAMILY HISTORY:   Family Status  Relation Status  . Mother Deceased       HTN  . Father Deceased       mi, clotting disorder  . Sister Alive  . Brother Alive  . Annamarie Major Deceased  . Brother Alive  . Brother Alive    ROS:  A complete 10 system review of systems was obtained and was unremarkable apart from what is mentioned above.  PHYSICAL EXAMINATION:    VITALS:   Vitals:   09/25/16 1537  BP: 136/70  Pulse: 80  SpO2: 97%  Weight: 139 lb (63 kg)  Height: 5\' 2"  (1.575 m)    GEN:  The patient appears stated age and is in NAD. HEENT:  Normocephalic, atraumatic.  The mucous membranes are moist. The superficial temporal arteries are without ropiness or tenderness. CV:   RRR Lungs:  CTAB Neck/HEME:  There are no carotid bruits bilaterally.  Neurological examination:  Orientation: The patient is alert and oriented x3.  Cranial nerves: There is good facial symmetry.  There is facial hypomimia.   The speech is fluent and clear. Soft palate rises symmetrically and there is no tongue deviation. Hearing is intact to conversational tone. Sensation: Sensation is intact to light touch throughout Motor: Strength is 5/5 in the bilateral upper and lower extremities.   Shoulder shrug is equal and symmetric.  There is no pronator  drift.  Movement examination: Tone: There is minimal rigidity in the UE bilaterally Abnormal movements: There is a very minor tremor in R thumb Coordination:  There is no decremation with any form of RAMS, including alternating supination and pronation of the forearm, hand opening and closing, finger taps, heel taps and toe taps. Gait and Station: The patient has no difficulty arising out of a deep-seated chair without the use of the hands. The patient's stride length is normal. No tremor today with ambulation  ASSESSMENT/PLAN:  1.  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  -Continue carbidopa/levodopa 25/100 1-1/2 tablets 3 times per day.  -Congratulated the patient as she is doing a significant amount of exercise through the [facility] with the Parkinson's program.  Also doing yoga  -expresses desire to be DNR  -invited to PD symposium 2.  Hyperreflexia   -insurance denied MRI brain 3.  RBD  -slightly increased sx's but decided to hold on meds for now 4.  Sialorrhea  -This is commonly associated with PD.  We talked about treatments.  The patient is not a candidate for oral anticholinergic therapy because of increased risk of confusion and falls.  We discussed myobloc and 1% atropine drops.  We discusssed that candy like lemon drops can help by  stimulating mm of the oropharynx to induce swallowing.  She doesn't want any medication right now. 5.  I will plan on seeing the patient back in the next 4-5 months, sooner should new neurologic issues arise.  Much greater than 50% of this visit was spent in counseling and coordinating care.  Total face to face time:  20 min

## 2016-09-25 ENCOUNTER — Ambulatory Visit
Admission: RE | Admit: 2016-09-25 | Discharge: 2016-09-25 | Disposition: A | Discharge status: Home / Self Care | Payer: Medicare Other | Source: Ambulatory Visit | Attending: Family Medicine | Admitting: Family Medicine

## 2016-09-25 ENCOUNTER — Ambulatory Visit (INDEPENDENT_AMBULATORY_CARE_PROVIDER_SITE_OTHER): Payer: Medicare Other | Admitting: Neurology

## 2016-09-25 ENCOUNTER — Encounter: Payer: Self-pay | Admitting: Neurology

## 2016-09-25 VITALS — BP 136/70 | HR 80 | Ht 62.0 in | Wt 139.0 lb

## 2016-09-25 DIAGNOSIS — K117 Disturbances of salivary secretion: Secondary | ICD-10-CM

## 2016-09-25 DIAGNOSIS — G20 Parkinson's disease: Secondary | ICD-10-CM | POA: Diagnosis not present

## 2016-09-25 DIAGNOSIS — Z1231 Encounter for screening mammogram for malignant neoplasm of breast: Secondary | ICD-10-CM

## 2016-09-25 NOTE — Patient Instructions (Signed)
You look great!  I am proud of you!  Keep exercising

## 2016-10-06 ENCOUNTER — Other Ambulatory Visit: Payer: Self-pay | Admitting: Hematology and Oncology

## 2016-10-06 DIAGNOSIS — D45 Polycythemia vera: Secondary | ICD-10-CM

## 2016-10-09 ENCOUNTER — Ambulatory Visit: Payer: Medicare Other | Admitting: Hematology and Oncology

## 2016-10-09 ENCOUNTER — Other Ambulatory Visit: Payer: Medicare Other

## 2016-10-25 MED ORDER — LISINOPRIL 20 MG TABLET
ORAL_TABLET | Freq: Every day | ORAL | 0 refills | 0.00000 days | Status: CP
Start: 2016-10-25 — End: ?

## 2016-10-30 ENCOUNTER — Ambulatory Visit: Payer: Medicare Other | Admitting: Hematology and Oncology

## 2016-10-30 ENCOUNTER — Other Ambulatory Visit: Payer: Medicare Other

## 2016-11-12 ENCOUNTER — Other Ambulatory Visit: Payer: Self-pay | Admitting: Hematology and Oncology

## 2016-11-12 DIAGNOSIS — D45 Polycythemia vera: Secondary | ICD-10-CM

## 2016-11-13 ENCOUNTER — Ambulatory Visit (HOSPITAL_BASED_OUTPATIENT_CLINIC_OR_DEPARTMENT_OTHER): Payer: Medicare Other | Admitting: Hematology and Oncology

## 2016-11-13 ENCOUNTER — Other Ambulatory Visit (HOSPITAL_BASED_OUTPATIENT_CLINIC_OR_DEPARTMENT_OTHER): Payer: Medicare Other

## 2016-11-13 ENCOUNTER — Encounter: Payer: Self-pay | Admitting: Hematology and Oncology

## 2016-11-13 ENCOUNTER — Other Ambulatory Visit: Payer: Medicare Other

## 2016-11-13 ENCOUNTER — Telehealth: Payer: Self-pay | Admitting: Hematology and Oncology

## 2016-11-13 DIAGNOSIS — I10 Essential (primary) hypertension: Secondary | ICD-10-CM

## 2016-11-13 DIAGNOSIS — D45 Polycythemia vera: Secondary | ICD-10-CM | POA: Diagnosis present

## 2016-11-13 LAB — CBC WITH DIFFERENTIAL/PLATELET
BASO%: 1 % (ref 0.0–2.0)
Basophils Absolute: 0.1 10*3/uL (ref 0.0–0.1)
EOS%: 3.5 % (ref 0.0–7.0)
Eosinophils Absolute: 0.2 10*3/uL (ref 0.0–0.5)
HCT: 41.4 % (ref 34.8–46.6)
HGB: 13.4 g/dL (ref 11.6–15.9)
LYMPH%: 28 % (ref 14.0–49.7)
MCH: 31.8 pg (ref 25.1–34.0)
MCHC: 32.5 g/dL (ref 31.5–36.0)
MCV: 98 fL (ref 79.5–101.0)
MONO#: 0.4 10*3/uL (ref 0.1–0.9)
MONO%: 7.4 % (ref 0.0–14.0)
NEUT#: 3.2 10*3/uL (ref 1.5–6.5)
NEUT%: 60.1 % (ref 38.4–76.8)
Platelets: 462 10*3/uL — ABNORMAL HIGH (ref 145–400)
RBC: 4.23 10*6/uL (ref 3.70–5.45)
RDW: 16.7 % — ABNORMAL HIGH (ref 11.2–14.5)
WBC: 5.2 10*3/uL (ref 3.9–10.3)
lymph#: 1.5 10*3/uL (ref 0.9–3.3)

## 2016-11-13 MED ORDER — HYDROXYUREA 500 MG PO CAPS: ORAL_CAPSULE | ORAL | 0 refills | Status: DC

## 2016-11-13 NOTE — Telephone Encounter (Signed)
Gave pt avs and calendar for upcoming appts.  °

## 2016-11-14 NOTE — Assessment & Plan Note (Signed)
She is noted to have mild progressive thrombocytosis with mild polycythemia Recommend adjusting the dose of hydroxyurea She would take 500 mg daily except on Mondays, Wednesdays and Fridays she takes 1000 mg I plan to see her back in 3 months She is reminded to take aspirin therapy

## 2016-11-14 NOTE — Progress Notes (Signed)
[place] OFFICE PROGRESS NOTE  Patient Care Team: Bernerd Limbo, MD as PCP - General (Family Medicine) Bernerd Limbo, MD as Attending Physician (Family Medicine) Christy Sartorius, MD as Referring Physician (Urology) Avel Sensor, MD as Attending Physician (Obstetrics and Gynecology) [name], MD as Referring Physician (Family Medicine) Heath Lark, MD as Consulting Physician (Hematology and Oncology) Tat, Eustace Quail, DO as Consulting Physician (Neurology)  SUMMARY OF ONCOLOGIC HISTORY:  This patient was discovered to have myeloproliferative disorder after routine blood work revealed elevated hemoglobin and platelet. 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 The patient never had bone marrow aspirate and biopsy. She was started on hydroxyurea and dose was adjusted due to hair thinning and fatigue On 09/30/2013, hydroxyurea dose was increased to 1000 mg on Mondays, Wednesdays and Fridays and 2 take 500 mg the rest of the week. On 10/22/2013, the dose of hydroxyurea was increased to 1000 mg daily along with phlebotomy. On August 2015, the dose of hydroxyurea was modified to 1000 mg daily Mondays to Fridays and 500 mg on Saturdays and Sunday.  However, the patient was not following instruction correctly. On 07/20/2014, the dose of hydroxyurea was modifed to 500 mg daily Mondays to Fridays and 1000 mg on Saturdays and Sundays On 02/08/2015, the dose of hydroxyurea was modified to 500 milligrams daily Mondays to Saturdays and 1000 mg on Sundays. She was referred to neurologist in 2016 and was diagnosed with Parkinson's disease On 07/17/2016, the dose of hydroxyurea is adjusted.  She takes 500 mg daily except for 2 days a week on Wednesdays and Sundays she take 1000 mg Starting 11/14/2016, the dose of hydroxyurea is suggested.  She is instructed to take 1000 mg on Mondays, Wednesdays and Fridays and take 500 mg daily for the rest of the week  INTERVAL  HISTORY: Please see below for problem oriented charting. She returns with her daughter She feels well Denies recent infection She has occasional missed doses but otherwise fairly consistent with taking hydroxyurea as directed She continues taking aspirin therapy  REVIEW OF SYSTEMS:   Constitutional: Denies fevers, chills or abnormal weight loss Eyes: Denies blurriness of vision Ears, nose, mouth, throat, and face: Denies mucositis or sore throat Respiratory: Denies cough, dyspnea or wheezes Cardiovascular: Denies palpitation, chest discomfort or lower extremity swelling Gastrointestinal:  Denies nausea, heartburn or change in bowel habits Skin: Denies abnormal skin rashes Lymphatics: Denies new lymphadenopathy or easy bruising Neurological:Denies numbness, tingling or new weaknesses Behavioral/Psych: Mood is stable, no new changes  All other systems were reviewed with the patient and are negative.  I have reviewed the past medical history, past surgical history, social history and family history with the patient and they are unchanged from previous note.  ALLERGIES:  has No Known Allergies.  MEDICATIONS:  Current Outpatient Prescriptions  Medication Sig Dispense Refill  . aspirin 81 MG tablet Take 81 mg by mouth daily.    . carbidopa-levodopa (SINEMET IR) 25-100 MG tablet Take 1.5 tablets by mouth 3 (three) times daily. 405 tablet 1  . Cholecalciferol (VITAMIN D3) 2000 UNITS capsule Take by mouth.    . hydroxyurea (HYDREA) 500 MG capsule Take 2 capsules on Mondays, Wednesdays and Fridays and one capsule daily for the rest of the week 100 capsule 0  . lisinopril (PRINIVIL,ZESTRIL) 20 MG tablet Take 20 mg by mouth.    . sertraline (ZOLOFT) 50 MG tablet      No current facility-administered medications for this visit.  PHYSICAL EXAMINATION: ECOG PERFORMANCE STATUS: 0 - Asymptomatic  Vitals:   11/13/16 1502  BP: (!) 141/87  Pulse: 90  Resp: 20  Temp: 98.8 F (37.1 C)   SpO2: 98%   Filed Weights   11/13/16 1502  Weight: 139 lb (63 kg)    GENERAL:alert, no distress and comfortable SKIN: skin color, texture, turgor are normal, no rashes or significant lesions EYES: normal, Conjunctiva are pink and non-injected, sclera clear OROPHARYNX:no exudate, no erythema and lips, buccal mucosa, and tongue normal  NECK: supple, thyroid normal size, non-tender, without nodularity LYMPH:  no palpable lymphadenopathy in the cervical, axillary or inguinal LUNGS: clear to auscultation and percussion with normal breathing effort HEART: regular rate & rhythm and no murmurs and no lower extremity edema ABDOMEN:abdomen soft, non-tender and normal bowel sounds Musculoskeletal:no cyanosis of digits and no clubbing  NEURO: alert & oriented x 3 with fluent speech, no focal motor/sensory deficits  LABORATORY DATA:  I have reviewed the data as listed    Component Value Date/Time   NA 139 11/16/2013 1404   K 4.0 11/16/2013 1404   CL 102 07/30/2012 1419   CO2 28 11/16/2013 1404   GLUCOSE 89 11/16/2013 1404   GLUCOSE 102 (H) 07/30/2012 1419   BUN 30.0 (H) 11/16/2013 1404   CREATININE 0.9 11/16/2013 1404   CALCIUM 9.1 11/16/2013 1404   PROT 6.7 11/16/2013 1404   ALBUMIN 4.0 11/16/2013 1404   AST 15 11/16/2013 1404   ALT <6 11/16/2013 1404   ALKPHOS 49 11/16/2013 1404   BILITOT 1.45 (H) 11/16/2013 1404   GFRNONAA 58 (L) 11/22/2009 1855   GFRAA  11/22/2009 1855    >60        The eGFR has been calculated using the MDRD equation. This calculation has not been validated in all clinical situations. eGFR's persistently <60 mL/min signify possible Chronic Kidney Disease.    No results found for: SPEP, UPEP  Lab Results  Component Value Date   WBC 5.2 11/13/2016   NEUTROABS 3.2 11/13/2016   HGB 13.4 11/13/2016   HCT 41.4 11/13/2016   MCV 98.0 11/13/2016   PLT 462 (H) 11/13/2016      Chemistry      Component Value Date/Time   NA 139 11/16/2013 1404   K 4.0  11/16/2013 1404   CL 102 07/30/2012 1419   CO2 28 11/16/2013 1404   BUN 30.0 (H) 11/16/2013 1404   CREATININE 0.9 11/16/2013 1404      Component Value Date/Time   CALCIUM 9.1 11/16/2013 1404   ALKPHOS 49 11/16/2013 1404   AST 15 11/16/2013 1404   ALT <6 11/16/2013 1404   BILITOT 1.45 (H) 11/16/2013 1404      ASSESSMENT & PLAN:  Polycythemia vera She is noted to have mild progressive thrombocytosis with mild polycythemia Recommend adjusting the dose of hydroxyurea She would take 500 mg daily except on Mondays, Wednesdays and Fridays she takes 1000 mg I plan to see her back in 3 months She is reminded to take aspirin therapy  Essential hypertension she will continue current medical management. I recommend close follow-up with primary care doctor for medication adjustment.    No orders of the defined types were placed in this encounter.  All questions were answered. The patient knows to call the clinic with any problems, questions or concerns. No barriers to learning was detected. I spent 10 minutes counseling the patient face to face. The total time spent in the appointment was 15 minutes and more than  50% was on counseling and review of test results     Heath Lark, MD 11/14/2016 11:52 AM

## 2016-11-14 NOTE — Assessment & Plan Note (Signed)
she will continue current medical management. I recommend close follow-up with primary care doctor for medication adjustment.  

## 2016-12-16 ENCOUNTER — Other Ambulatory Visit: Payer: Self-pay | Admitting: Neurology

## 2017-02-25 NOTE — Progress Notes (Signed)
Carolyn Figueroa was seen today in the movement disorders clinic for neurologic consultation at the request of Carolyn Figueroa.  Her PCP is Carolyn Bill, MD.  The consultation is for the evaluation of tremor.  This patient is accompanied in the office by her child who supplements the history.  The records that were made available to me were reviewed.  Her records indicate that she has had tremor at least since August, 2015.  She was referred to my office earlier this year but was unable to make the appointment.  Tremor started in the right hand but recently migrated to the L hand; she initially didn't notice it at all and would deny it was happening but now she is noting trouble with putting spices in food because of tremor.  Her daughter sees tremor at rest and has for a while.  There is no fam hx of tremor.  She is R hand dominant.  She c/o that her strength is not as good and has trouble pouring a big pot of soup or water.  05/31/15 update:  The patient follows up today, accompanied by her daughter who supplements the history.  She started on levodopa last visit.  She denies any falls since last visit. States that she is walking in her neighborhood faithfully for exercise.  Having much less tremor and overall feeling better.  No hallucinations.  No lightheadedness or near syncope.  She refused Parkinson's therapies.  Her insurance denied her MRI of the brain.  09/30/15 update:  The patient follows up today.  She is accompanied by her daughter supplements the history.  I have reviewed previous records since our last visit.  She saw Carolyn Figueroa such and her polycythemia has been stable.  She remains on carbidopa/levodopa 25/100, one tablet 3 times per day.  She had 1 fall on vacation while walking down a steep incline to the beach.  No lightheadedness or near syncope.  No hallucinations.  She did go to [place] since our last visit and had a great time.  Having cough.  Wonders if from PD vs  lisinopril.  02/21/16 update:  Pt f/u today, accompanied by her daughter who supplements the hx.  She is on carbidopa/levodopa 25/100 tid and doing well.  Pt denies falls.  Pt denies lightheadedness, near syncope.  No hallucinations.  Mood has been good.  She is doing exercise at the [facility] 3 times a week with the Parkinson's biking program.  Has noticed some stiffness of the right arm.  06/05/16 update:  Patient follows up today, accompanied by her daughter who supplements the history.  Last visit, I recommended that she increase her carbidopa/levodopa 25/100 to a total of 1-1/2 tablets 3 times per day.  She reports that she has been doing well with this dose.  She is exercising with the Parkinson's bicycle program.  She is doing better with the rigidity in her arm.  She is not falling.  She denies lightheadedness or near syncope.  No hallucinations.  She does act out the dreams.  She doesn't fall out of the bed.  Noting some increased drooling.  09/25/16 update:  Pt seen in f/u.  This patient is accompanied in the office by her daughter who supplements the history.  She is on carbidopa/levodopa 25/100, 1.5 tablets tid.  She is doing well, without SE.  Pt had one fall when she tripped over mini trampoline of her grandchild and fell and hit shoulder.  No LOC.  Pt  denies lightheadedness, near syncope.  No hallucinations.  Mood has been good.  02/26/17 update: Patient seen in follow-up for her Parkinson's disease.  She is accompanied by her daughter who supplements the history.  She remains on carbidopa/levodopa 25/100, 1.5 tablet 3 times per day.  Pt denies falls.  Pt denies lightheadedness, near syncope.  No hallucinations.  Mood has been good.  She is exercising 3 days/week.  She is occasionally acting at the dreams, but that usually just means yelling out in her sleep.  She has not fallen out of the bed.  She has not done anything dangerous.  Neuroimaging has not previously been performed.     PREVIOUS MEDICATIONS: none to date  ALLERGIES:  No Known Allergies  CURRENT MEDICATIONS:  Outpatient Encounter Medications as of 02/26/2017  Medication Sig  . aspirin 81 MG tablet Take 81 mg by mouth daily.  . carbidopa-levodopa (SINEMET IR) 25-100 MG tablet TAKE 1 AND 1/2 TABLETS BY MOUTH THREE TIMES DAILY  . Cholecalciferol (VITAMIN D3) 2000 UNITS capsule Take by mouth.  . hydroxyurea (HYDREA) 500 MG capsule Take 2 capsules on Mondays, Wednesdays and Fridays and one capsule daily for the rest of the week  . lisinopril (PRINIVIL,ZESTRIL) 20 MG tablet Take 20 mg by mouth.  . sertraline (ZOLOFT) 50 MG tablet    No facility-administered encounter medications on file as of 02/26/2017.     PAST MEDICAL HISTORY:   Past Medical History:  Diagnosis Date  . Colon polyp 05/2003  . Depression with anxiety   . HTN (hypertension)   . Insomnia   . Need for prophylactic vaccination and inoculation against influenza 01/14/2013  . Polycythemia   . 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 Thrombocytosis ([place])   . Tremor 11/16/2013  . Varicose vein   . Vitamin D deficiency   . Vulvar candidiasis   . Weight loss 11/16/2013    PAST SURGICAL HISTORY:   Past Surgical History:  Procedure Laterality Date  . NO PAST SURGERIES      SOCIAL HISTORY:   Social History   Socioeconomic History  . Marital status: Married    Spouse name: Not on file  . Number of children: 2  . Years of education: Not on file  . Highest education level: Not on file  Social Needs  . Financial resource strain: Not on file  . Food insecurity - worry: Not on file  . Food insecurity - inability: Not on file  . Transportation needs - medical: Not on file  . Transportation needs - non-medical: Not on file  Occupational History    Employer: UNEMPLOYED    Comment: Housewife  Tobacco Use  . Smoking status: Never Smoker  . Smokeless tobacco: Never Used  Substance and Sexual Activity  . Alcohol use: No  . Drug use: No   . Sexual activity: No    Birth control/protection: None  Other Topics Concern  . Not on file  Social History Narrative  . Not on file    FAMILY HISTORY:   Family Status  Relation Name Status  . Mother  Deceased       HTN  . Father  Deceased       mi, clotting disorder  . Sister  Alive  . Brother  Alive  . Annamarie Major  Deceased  . Brother  Alive  . Brother  Alive    ROS:  A complete 10 system review of systems was obtained and was unremarkable apart from what is mentioned above.  PHYSICAL EXAMINATION:    VITALS:   Vitals:   02/26/17 1528  BP: 140/82  Pulse: 83  SpO2: 97%  Weight: 139 lb (63 kg)  Height: 5\' 2"  (1.575 m)    GEN:  The patient appears stated age and is in NAD. HEENT:  Normocephalic, atraumatic.  The mucous membranes are moist. The superficial temporal arteries are without ropiness or tenderness. CV:  RRR Lungs:  CTAB Neck/HEME:  There are no carotid bruits bilaterally.  Neurological examination:  Orientation: The patient is alert and oriented x3.  Cranial nerves: There is good facial symmetry.  There is facial hypomimia.   The speech is fluent and clear. Soft palate rises symmetrically and there is no tongue deviation. Hearing is intact to conversational tone. Sensation: Sensation is intact to light touch throughout Motor: Strength is 5/5 in the bilateral upper and lower extremities.   Shoulder shrug is equal and symmetric.  There is no pronator drift.  Movement examination: Tone: There is normal tone. Abnormal movements: There is no tremor noted today. Coordination:  There is no decremation with any form of RAMS, including alternating supination and pronation of the forearm, hand opening and closing, finger taps, heel taps and toe taps. Gait and Station: The patient has no difficulty arising out of a deep-seated chair without the use of the hands. The patient's stride length is normal. No tremor today with ambulation  ASSESSMENT/PLAN:  1.   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  -doing well on carbidopa/levodopa 25/100, 1.5 tablets tid.  -Congratulated the patient as she is doing a significant amount of exercise through the [facility] with the Parkinson's program.  Also doing yoga  -Asks me to help her with her advanced care planning.  They have just completed her will and healthcare power of attorney.  I have entered new orders.  I have done advance directives within the system.  I have given the patient and her daughter two copies of the DNR form, as requested by the patient and her daughter.  They understand they need to take this to doctors and hospitals.  I filled out level of care orders.  Various scenarios introduced to pt to explain role of each. Instructed to make sure provider/office and hospital have copies as well as family members in the event of serious illness/emergency situation. I spent 30 minutes doing this. 2.  Hyperreflexia   -insurance denied MRI brain 3.  RBD  -Long counseling session with the patient and her daughter today about the fact that this can actually be dangerous.  Ultimately, they decided to hold on clonazepam.  Her daughter thought that she had taken this years ago when the patient's husband first died and was not sure that she tolerated it well.  We can discuss this in the future if it seems to progress. 4.  Sialorrhea  -This is commonly associated with PD.  We talked about treatments.  The patient is not a candidate for oral anticholinergic therapy because of increased risk of confusion and falls.  We discussed myobloc and 1% atropine drops.  We discusssed that candy like lemon drops can help by stimulating mm of the oropharynx to induce swallowing.  She doesn't want any medication right now. 5. Follow up is anticipated in the next 5 months, sooner should new neurologic issues arise.

## 2017-02-26 ENCOUNTER — Ambulatory Visit (HOSPITAL_BASED_OUTPATIENT_CLINIC_OR_DEPARTMENT_OTHER): Payer: Medicare Other | Admitting: Hematology and Oncology

## 2017-02-26 ENCOUNTER — Other Ambulatory Visit (HOSPITAL_BASED_OUTPATIENT_CLINIC_OR_DEPARTMENT_OTHER): Payer: Medicare Other

## 2017-02-26 ENCOUNTER — Telehealth: Payer: Self-pay | Admitting: Hematology and Oncology

## 2017-02-26 ENCOUNTER — Encounter: Payer: Self-pay | Admitting: Neurology

## 2017-02-26 ENCOUNTER — Encounter: Payer: Self-pay | Admitting: Hematology and Oncology

## 2017-02-26 ENCOUNTER — Ambulatory Visit (INDEPENDENT_AMBULATORY_CARE_PROVIDER_SITE_OTHER): Payer: Medicare Other | Admitting: Neurology

## 2017-02-26 VITALS — BP 140/82 | HR 83 | Ht 62.0 in | Wt 139.0 lb

## 2017-02-26 DIAGNOSIS — G20 Parkinson's disease: Secondary | ICD-10-CM

## 2017-02-26 DIAGNOSIS — G4752 REM sleep behavior disorder: Secondary | ICD-10-CM

## 2017-02-26 DIAGNOSIS — D45 Polycythemia vera: Secondary | ICD-10-CM

## 2017-02-26 LAB — CBC WITH DIFFERENTIAL/PLATELET
BASO%: 0.9 % (ref 0.0–2.0)
Basophils Absolute: 0 10*3/uL (ref 0.0–0.1)
EOS%: 7.6 % — ABNORMAL HIGH (ref 0.0–7.0)
Eosinophils Absolute: 0.4 10*3/uL (ref 0.0–0.5)
HCT: 43.2 % (ref 34.8–46.6)
HGB: 13.9 g/dL (ref 11.6–15.9)
LYMPH%: 22.5 % (ref 14.0–49.7)
MCH: 31.8 pg (ref 25.1–34.0)
MCHC: 32.1 g/dL (ref 31.5–36.0)
MCV: 99.1 fL (ref 79.5–101.0)
MONO#: 0.4 10*3/uL (ref 0.1–0.9)
MONO%: 6.8 % (ref 0.0–14.0)
NEUT#: 3.3 10*3/uL (ref 1.5–6.5)
NEUT%: 62.2 % (ref 38.4–76.8)
Platelets: 480 10*3/uL — ABNORMAL HIGH (ref 145–400)
RBC: 4.37 10*6/uL (ref 3.70–5.45)
RDW: 14.9 % — ABNORMAL HIGH (ref 11.2–14.5)
WBC: 5.3 10*3/uL (ref 3.9–10.3)
lymph#: 1.2 10*3/uL (ref 0.9–3.3)

## 2017-02-26 NOTE — Progress Notes (Signed)
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 The patient never had bone marrow aspirate and biopsy. She was started on hydroxyurea and dose was adjusted due to hair thinning and fatigue On 09/30/2013, hydroxyurea dose was increased to 1000 mg on Mondays, Wednesdays and Fridays and 2 take 500 mg the rest of the week. On 10/22/2013, the dose of hydroxyurea was increased to 1000 mg daily along with phlebotomy. On August 2015, the dose of hydroxyurea was modified to 1000 mg daily Mondays to Fridays and 500 mg on Saturdays and Sunday.  However, the patient was not following instruction correctly. On 07/20/2014, the dose of hydroxyurea was modifed to 500 mg daily Mondays to Fridays and 1000 mg on Saturdays and Sundays On 02/08/2015, the dose of hydroxyurea was modified to 500 milligrams daily Mondays to Saturdays and 1000 mg on Sundays. She was referred to neurologist in 2016 and was diagnosed with Parkinson's disease On 07/17/2016, the dose of hydroxyurea is adjusted.  She takes 500 mg daily except for 2 days a week on Wednesdays and Sundays she take 1000 mg Starting 11/14/2016, the dose of hydroxyurea is suggested.  She is instructed to take 1000 mg on Mondays, Wednesdays and Fridays and take 500 mg daily for the rest of the week  INTERVAL HISTORY: Please see below for problem oriented charting. She returns with her daughter She is not taking her medications  as instructed Overall, she feels well Denies recent infection From her Parkinson's disease standpoint, she had no progression of neurological deficits of fall  REVIEW OF SYSTEMS:   Constitutional: Denies fevers, chills or abnormal weight loss Eyes: Denies blurriness of vision Ears, nose, mouth, throat, and face: Denies mucositis or sore throat Respiratory: Denies cough, dyspnea or wheezes Cardiovascular: Denies palpitation, chest discomfort or lower extremity swelling Gastrointestinal:  Denies nausea, heartburn or change in bowel habits Skin: Denies abnormal skin rashes Lymphatics: Denies new lymphadenopathy or easy bruising Neurological:Denies numbness, tingling or new weaknesses Behavioral/Psych: Mood is stable, no new changes  All other systems were reviewed with the patient and are negative.  I have reviewed the past medical history, past surgical history, social history and family history with the patient and they are unchanged from previous note.  ALLERGIES:  has No Known Allergies.  MEDICATIONS:  Current Outpatient Medications  Medication Sig Dispense Refill  . aspirin 81 MG tablet Take 81 mg by mouth daily.    . carbidopa-levodopa (SINEMET IR) 25-100 MG tablet TAKE 1 AND 1/2 TABLETS BY MOUTH THREE TIMES DAILY 405 tablet 1  . Cholecalciferol (VITAMIN D3) 2000 UNITS capsule Take by mouth.    . hydroxyurea (HYDREA) 500 MG capsule Take 2 capsules on Mondays, Wednesdays and Fridays and one capsule daily for the rest of the week 100 capsule 0  . lisinopril (PRINIVIL,ZESTRIL) 20 MG tablet Take 20 mg by mouth.    . sertraline (ZOLOFT) 50 MG tablet      No current facility-administered medications for this visit.     PHYSICAL EXAMINATION: ECOG PERFORMANCE STATUS: 1 - Symptomatic but  completely ambulatory  Vitals:   02/26/17 1412  BP: 136/79  Pulse: 92  Resp: 16  Temp: 98.8 F (37.1 C)  SpO2: 98%   Filed Weights   02/26/17 1412  Weight: 140 lb 1.6 oz (63.5 kg)     GENERAL:alert, no distress and comfortable SKIN: skin color, texture, turgor are normal, no rashes or significant lesions EYES: normal, Conjunctiva are pink and non-injected, sclera clear OROPHARYNX:no exudate, no erythema and lips, buccal mucosa, and tongue normal  NECK: supple, thyroid normal size, non-tender, without nodularity LYMPH:  no palpable lymphadenopathy in the cervical, axillary or inguinal LUNGS: clear to auscultation and percussion with normal breathing effort HEART: regular rate & rhythm and no murmurs and no lower extremity edema ABDOMEN:abdomen soft, non-tender and normal bowel sounds Musculoskeletal:no cyanosis of digits and no clubbing  NEURO: alert & oriented x 3 with fluent speech, no focal motor/sensory deficits.  Noted resting tremor  LABORATORY DATA:  I have reviewed the data as listed    Component Value Date/Time   NA 139 11/16/2013 1404   K 4.0 11/16/2013 1404   CL 102 07/30/2012 1419   CO2 28 11/16/2013 1404   GLUCOSE 89 11/16/2013 1404   GLUCOSE 102 (H) 07/30/2012 1419   BUN 30.0 (H) 11/16/2013 1404   CREATININE 0.9 11/16/2013 1404   CALCIUM 9.1 11/16/2013 1404   PROT 6.7 11/16/2013 1404   ALBUMIN 4.0 11/16/2013 1404   AST 15 11/16/2013 1404   ALT <6 11/16/2013 1404   ALKPHOS 49 11/16/2013 1404   BILITOT 1.45 (H) 11/16/2013 1404   GFRNONAA 58 (L) 11/22/2009 1855   GFRAA  11/22/2009 1855    >60        The eGFR has been calculated using the MDRD equation. This calculation has not been validated in all clinical situations. eGFR's persistently <60 mL/min signify possible Chronic Kidney Disease.    No results found for: SPEP, UPEP  Lab Results  Component Value Date   WBC 5.3 02/26/2017   NEUTROABS 3.3 02/26/2017   HGB 13.9 02/26/2017   HCT 43.2 02/26/2017   MCV 99.1 02/26/2017   PLT 480 (H) 02/26/2017      Chemistry      Component Value Date/Time   NA 139 11/16/2013 1404   K 4.0 11/16/2013 1404   CL 102 07/30/2012 1419   CO2  28 11/16/2013 1404   BUN 30.0 (H) 11/16/2013 1404   CREATININE 0.9 11/16/2013 1404      Component Value Date/Time   CALCIUM 9.1 11/16/2013 1404   ALKPHOS 49 11/16/2013 1404   AST 15 11/16/2013 1404   ALT <6 11/16/2013 1404   BILITOT 1.45 (H) 11/16/2013 1404     ASSESSMENT & PLAN:  Polycythemia vera She is noted to have mild progressive thrombocytosis with mild polycythemia Previously, I recommended modification of hydroxyurea, whereby she would take 500 mg daily except on Mondays, Wednesdays and Fridays she takes 1000 mg However, she was not following instruction correctly and was only taking 500 mg daily except for Sunday she took 1000 mg For this reason, I do not want to do further dose adjustment  I reinforce compliance and her daughter will help make sure that she takes her medications correctly I plan to see her back in 3 months She is reminded to take aspirin therapy  Parkinson disease ([place]) She is currently on treatment and follows with neurologist closely. Overall, she felt that her tremors have improved. I would defer to them for further management  No orders of the defined types were placed in this encounter.  All questions were answered. The patient knows to call the clinic with any problems, questions or concerns. No barriers to learning was detected. I spent 10 minutes counseling the patient face to face. The total time spent in the appointment was 15 minutes and more than 50% was on counseling and review of test results     Heath Lark, MD 02/26/2017 4:13 PM

## 2017-02-26 NOTE — Assessment & Plan Note (Signed)
She is noted to have mild progressive thrombocytosis with mild polycythemia Previously, I recommended modification of hydroxyurea, whereby she would take 500 mg daily except on Mondays, Wednesdays and Fridays she takes 1000 mg However, she was not following instruction correctly and was only taking 500 mg daily except for Sunday she took 1000 mg For this reason, I do not want to do further dose adjustment  I reinforce compliance and her daughter will help make sure that she takes her medications correctly I plan to see her back in 3 months She is reminded to take aspirin therapy

## 2017-02-26 NOTE — Patient Instructions (Signed)
You look great!  I am proud of you and all your exercise!

## 2017-02-26 NOTE — Assessment & Plan Note (Signed)
She is currently on treatment and follows with neurologist closely. Overall, she felt that her tremors have improved. I would defer to them for further management 

## 2017-02-26 NOTE — Telephone Encounter (Signed)
Gave avs and calendar for March 2019 °

## 2017-03-05 ENCOUNTER — Other Ambulatory Visit: Payer: Self-pay | Admitting: Hematology and Oncology

## 2017-03-05 DIAGNOSIS — D45 Polycythemia vera: Secondary | ICD-10-CM

## 2017-03-05 NOTE — Telephone Encounter (Signed)
Can you ask her daughter which pharmacy? I was not able to refill electronically to the [facility]

## 2017-05-27 ENCOUNTER — Other Ambulatory Visit: Payer: Medicare Other

## 2017-05-27 ENCOUNTER — Ambulatory Visit: Payer: Medicare Other | Admitting: Hematology and Oncology

## 2017-05-28 ENCOUNTER — Inpatient Hospital Stay: Payer: Medicare Other | Attending: Hematology and Oncology

## 2017-05-28 ENCOUNTER — Inpatient Hospital Stay (HOSPITAL_BASED_OUTPATIENT_CLINIC_OR_DEPARTMENT_OTHER): Payer: Medicare Other | Admitting: Hematology and Oncology

## 2017-05-28 ENCOUNTER — Encounter: Payer: Self-pay | Admitting: Hematology and Oncology

## 2017-05-28 ENCOUNTER — Telehealth: Payer: Self-pay | Admitting: Hematology and Oncology

## 2017-05-28 DIAGNOSIS — D45 Polycythemia vera: Secondary | ICD-10-CM | POA: Diagnosis not present

## 2017-05-28 DIAGNOSIS — G20 Parkinson's disease: Secondary | ICD-10-CM | POA: Diagnosis not present

## 2017-05-28 DIAGNOSIS — G620 Drug-induced polyneuropathy: Secondary | ICD-10-CM | POA: Diagnosis not present

## 2017-05-28 DIAGNOSIS — Z7982 Long term (current) use of aspirin: Secondary | ICD-10-CM | POA: Insufficient documentation

## 2017-05-28 DIAGNOSIS — I10 Essential (primary) hypertension: Secondary | ICD-10-CM | POA: Diagnosis not present

## 2017-05-28 LAB — CBC WITH DIFFERENTIAL/PLATELET
Basophils Absolute: 0 10*3/uL (ref 0.0–0.1)
Basophils Relative: 1 %
Eosinophils Absolute: 0.2 10*3/uL (ref 0.0–0.5)
Eosinophils Relative: 4 %
HCT: 43.3 % (ref 34.8–46.6)
Hemoglobin: 13.7 g/dL (ref 11.6–15.9)
Lymphocytes Relative: 32 %
Lymphs Abs: 1.3 10*3/uL (ref 0.9–3.3)
MCH: 31.6 pg (ref 25.1–34.0)
MCHC: 31.7 g/dL (ref 31.5–36.0)
MCV: 99.7 fL (ref 79.5–101.0)
Monocytes Absolute: 0.4 10*3/uL (ref 0.1–0.9)
Monocytes Relative: 9 %
Neutro Abs: 2.3 10*3/uL (ref 1.5–6.5)
Neutrophils Relative %: 54 %
Platelets: 374 10*3/uL (ref 145–400)
RBC: 4.34 MIL/uL (ref 3.70–5.45)
RDW: 17.2 % — ABNORMAL HIGH (ref 11.2–14.5)
WBC: 4.2 10*3/uL (ref 3.9–10.3)

## 2017-05-28 MED ORDER — HYDROXYUREA 500 MG PO CAPS: ORAL_CAPSULE | ORAL | 6 refills | Status: DC

## 2017-05-28 NOTE — Assessment & Plan Note (Signed)
she will continue current medical management. I recommend close follow-up with primary care doctor for medication adjustment.  

## 2017-05-28 NOTE — Telephone Encounter (Signed)
Gave avs and calendar for September  °

## 2017-05-28 NOTE — Assessment & Plan Note (Signed)
The patient has been taking her medications correctly and consistently over the last 3 months CBC today is within normal limits We will continue the same dose of hydroxyurea, to be taken at 1000 mg on Mondays, Wednesdays and Fridays and to take 500 mg for the rest of the week She will continue to take aspirin 81 mg daily Plan to see her back in 6 months I warned the patient and her daughter about signs and symptoms to watch out for possible pancytopenia and to reduce be seen sooner for possible dose adjustment if she experienced significant stress, surgery or infection

## 2017-05-28 NOTE — Progress Notes (Signed)
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 her medications correctly and consistently over the last 3 months CBC today is within normal limits We will continue the same dose of hydroxyurea, to be taken at 1000 mg on Mondays, Wednesdays and Fridays and to take 500 mg for the rest of the week She will continue to take aspirin 81 mg daily Plan to see her back in 6 months I warned the patient and her daughter about signs and symptoms to watch out for possible pancytopenia and to reduce be seen sooner for possible dose adjustment if she experienced significant stress, surgery or infection   Essential hypertension she will continue current medical management. I recommend close follow-up with primary care doctor for medication adjustment.    No orders of the defined types were placed in this encounter.   INTERVAL HISTORY: Please see below for problem oriented charting. She returns with her daughter for further follow-up She has been compliant taking her medications as directed She denies recent infection The patient denies any recent signs or symptoms of bleeding such as spontaneous epistaxis, hematuria or hematochezia.  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 The patient never had bone marrow aspirate and biopsy. She was started on hydroxyurea and dose was adjusted due to hair thinning and fatigue On 09/30/2013,  hydroxyurea dose was increased to 1000 mg on Mondays, Wednesdays and Fridays and 2 take 500 mg the rest of the week. On 10/22/2013, the dose of hydroxyurea was increased to 1000 mg daily along with phlebotomy. On August 2015, the dose of hydroxyurea was modified to 1000 mg daily Mondays to Fridays and 500 mg on Saturdays and Sunday.  However, the patient was not following instruction correctly. On 07/20/2014, the dose of hydroxyurea was modifed to 500 mg daily Mondays to Fridays and 1000 mg on Saturdays and Sundays On 02/08/2015, the dose of hydroxyurea was modified to 500 milligrams daily Mondays to Saturdays and 1000 mg on Sundays. She was referred to neurologist in 2016 and was diagnosed with Parkinson's disease On 07/17/2016, the dose of hydroxyurea is adjusted.  She takes 500 mg daily except for 2 days a week on Wednesdays and Sundays she take 1000 mg Starting 11/14/2016, the dose of hydroxyurea is suggested.  She is instructed to take 1000 mg on Mondays, Wednesdays and Fridays and take 500 mg daily for the rest of the week  REVIEW OF SYSTEMS:   Constitutional: Denies fevers, chills or abnormal weight loss Eyes: Denies blurriness of vision Ears, nose, mouth, throat, and face: Denies mucositis or sore throat Respiratory: Denies cough, dyspnea or wheezes Cardiovascular: Denies palpitation, chest discomfort or lower extremity swelling Gastrointestinal:  Denies nausea, heartburn or change in bowel habits Skin: Denies abnormal skin rashes Lymphatics: Denies new lymphadenopathy or easy bruising Neurological:Denies numbness, tingling or new weaknesses Behavioral/Psych: Mood is stable, no new changes  All other systems were reviewed with the patient and are negative.  I have reviewed the past medical history, past surgical history, social history and family history  with the patient and they are unchanged from previous note.  ALLERGIES:  has No Known Allergies.  MEDICATIONS:  Current Outpatient  Medications  Medication Sig Dispense Refill  . aspirin 81 MG tablet Take 81 mg by mouth daily.    . carbidopa-levodopa (SINEMET IR) 25-100 MG tablet TAKE 1 AND 1/2 TABLETS BY MOUTH THREE TIMES DAILY 405 tablet 1  . Cholecalciferol (VITAMIN D3) 2000 UNITS capsule Take by mouth.    . hydroxyurea (HYDREA) 500 MG capsule TAKE 2 CAPSULES BY MOUTH ON MONDAY, WEDNESDAYS, AND FRIDAYS AND 1 CAPSULE BY MOUTH EVERY DAY FOR OTHER DAYS OF THE WEEK 100 capsule 6  . lisinopril (PRINIVIL,ZESTRIL) 20 MG tablet Take 20 mg by mouth.    . sertraline (ZOLOFT) 50 MG tablet      No current facility-administered medications for this visit.     PHYSICAL EXAMINATION: ECOG PERFORMANCE STATUS: 0 - Asymptomatic  Vitals:   05/28/17 1502  BP: (!) 155/89  Pulse: 78  Resp: 18  Temp: 98.4 F (36.9 C)  SpO2: 100%   Filed Weights   05/28/17 1502  Weight: 139 lb 6.4 oz (63.2 kg)    GENERAL:alert, no distress and comfortable SKIN: skin color, texture, turgor are normal, no rashes or significant lesions EYES: normal, Conjunctiva are pink and non-injected, sclera clear OROPHARYNX:no exudate, no erythema and lips, buccal mucosa, and tongue normal  NECK: supple, thyroid normal size, non-tender, without nodularity LYMPH:  no palpable lymphadenopathy in the cervical, axillary or inguinal LUNGS: clear to auscultation and percussion with normal breathing effort HEART: regular rate & rhythm and no murmurs and no lower extremity edema ABDOMEN:abdomen soft, non-tender and normal bowel sounds Musculoskeletal:no cyanosis of digits and no clubbing  NEURO: alert & oriented x 3 with fluent speech, no focal motor/sensory deficits  LABORATORY DATA:  I have reviewed the data as listed    Component Value Date/Time   NA 139 11/16/2013 1404   K 4.0 11/16/2013 1404   CL 102 07/30/2012 1419   CO2 28 11/16/2013 1404   GLUCOSE 89 11/16/2013 1404   GLUCOSE 102 (H) 07/30/2012 1419   BUN 30.0 (H) 11/16/2013 1404   CREATININE 0.9  11/16/2013 1404   CALCIUM 9.1 11/16/2013 1404   PROT 6.7 11/16/2013 1404   ALBUMIN 4.0 11/16/2013 1404   AST 15 11/16/2013 1404   ALT <6 11/16/2013 1404   ALKPHOS 49 11/16/2013 1404   BILITOT 1.45 (H) 11/16/2013 1404   GFRNONAA 58 (L) 11/22/2009 1855   GFRAA  11/22/2009 1855    >60        The eGFR has been calculated using the MDRD equation. This calculation has not been validated in all clinical situations. eGFR's persistently <60 mL/min signify possible Chronic Kidney Disease.    No results found for: SPEP, UPEP  Lab Results  Component Value Date   WBC 5.3 02/26/2017   NEUTROABS 3.3 02/26/2017   HGB 13.9 02/26/2017   HCT 43.2 02/26/2017   MCV 99.1 02/26/2017   PLT 480 (H) 02/26/2017      Chemistry      Component Value Date/Time   NA 139 11/16/2013 1404   K 4.0 11/16/2013 1404   CL 102 07/30/2012 1419   CO2 28 11/16/2013 1404   BUN 30.0 (H) 11/16/2013 1404   CREATININE 0.9 11/16/2013 1404      Component Value Date/Time   CALCIUM 9.1 11/16/2013 1404   ALKPHOS 49 11/16/2013 1404   AST 15 11/16/2013 1404   ALT <6 11/16/2013 1404  BILITOT 1.45 (H) 11/16/2013 1404      All questions were answered. The patient knows to call the clinic with any problems, questions or concerns. No barriers to learning was detected.  I spent 10 minutes counseling the patient face to face. The total time spent in the appointment was 15 minutes and more than 50% was on counseling and review of test results  Heath Lark, MD 05/28/2017 3:35 PM

## 2017-07-29 ENCOUNTER — Other Ambulatory Visit: Payer: Self-pay | Admitting: Neurology

## 2017-07-29 MED ORDER — CARBIDOPA-LEVODOPA 25-100 MG PO TABS: 1.5000 | ORAL_TABLET | Freq: Three times a day (TID) | ORAL | 1 refills | Status: DC

## 2017-07-29 NOTE — Progress Notes (Signed)
Carolyn Figueroa was seen today in the movement disorders clinic for neurologic consultation at the request of Dr. Alvy Bimler.  Her PCP is [name], MD.  The consultation is for the evaluation of tremor.  This patient is accompanied in the office by her child who supplements the history.  The records that were made available to me were reviewed.  Her records indicate that she has had tremor at least since August, 2015.  She was referred to my office earlier this year but was unable to make the appointment.  Tremor started in the right hand but recently migrated to the L hand; she initially didn't notice it at all and would deny it was happening but now she is noting trouble with putting spices in food because of tremor.  Her daughter sees tremor at rest and has for a while.  There is no fam hx of tremor.  She is R hand dominant.  She c/o that her strength is not as good and has trouble pouring a big pot of soup or water.  05/31/15 update:  The patient follows up today, accompanied by her daughter who supplements the history.  She started on levodopa last visit.  She denies any falls since last visit. States that she is walking in her neighborhood faithfully for exercise.  Having much less tremor and overall feeling better.  No hallucinations.  No lightheadedness or near syncope.  She refused Parkinson's therapies.  Her insurance denied her MRI of the brain.  09/30/15 update:  The patient follows up today.  She is accompanied by her daughter supplements the history.  I have reviewed previous records since our last visit.  She saw Dr. Alvy Bimler such and her polycythemia has been stable.  She remains on carbidopa/levodopa 25/100, one tablet 3 times per day.  She had 1 fall on vacation while walking down a steep incline to the beach.  No lightheadedness or near syncope.  No hallucinations.  She did go to [place] since our last visit and had a great time.  Having cough.  Wonders if from PD vs  lisinopril.  02/21/16 update:  Pt f/u today, accompanied by her daughter who supplements the hx.  She is on carbidopa/levodopa 25/100 tid and doing well.  Pt denies falls.  Pt denies lightheadedness, near syncope.  No hallucinations.  Mood has been good.  She is doing exercise at the [facility] 3 times a week with the Parkinson's biking program.  Has noticed some stiffness of the right arm.  06/05/16 update:  Patient follows up today, accompanied by her daughter who supplements the history.  Last visit, I recommended that she increase her carbidopa/levodopa 25/100 to a total of 1-1/2 tablets 3 times per day.  She reports that she has been doing well with this dose.  She is exercising with the Parkinson's bicycle program.  She is doing better with the rigidity in her arm.  She is not falling.  She denies lightheadedness or near syncope.  No hallucinations.  She does act out the dreams.  She doesn't fall out of the bed.  Noting some increased drooling.  09/25/16 update:  Pt seen in f/u.  This patient is accompanied in the office by her daughter who supplements the history.  She is on carbidopa/levodopa 25/100, 1.5 tablets tid.  She is doing well, without SE.  Pt had one fall when she tripped over mini trampoline of her grandchild and fell and hit shoulder.  No LOC.  Pt  denies lightheadedness, near syncope.  No hallucinations.  Mood has been good.  02/26/17 update: Patient seen in follow-up for her Parkinson's disease.  She is accompanied by her daughter who supplements the history.  She remains on carbidopa/levodopa 25/100, 1.5 tablet 3 times per day.  Pt denies falls.  Pt denies lightheadedness, near syncope.  No hallucinations.  Mood has been good.  She is exercising 3 days/week.  She is occasionally acting at the dreams, but that usually just means yelling out in her sleep.  She has not fallen out of the bed.  She has not done anything dangerous.  07/30/17 update: Patient is seen today in follow-up for  Parkinson's disease.  She is accompanied by her daughter who supplements the history.  Patient is on carbidopa/levodopa 25/100, 1-1/2 tablets 3 times per day.  She has had no falls.  No lightheadedness or near syncope.  No hallucinations.  No visual distortions.  Her mood has been good.  She continues to exercise regularly.  In regards to acting out of the dreams, she states that she is having bad dreams but not acting them out.  I have reviewed records since our last visit.  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  She is on hydroxyurea for that.  Daughter asks about programs like rock steady boxing.  Neuroimaging has not previously been performed.    PREVIOUS MEDICATIONS: none to date  ALLERGIES:  No Known Allergies  CURRENT MEDICATIONS:  Outpatient Encounter Medications as of 07/30/2017  Medication Sig  . aspirin 81 MG tablet Take 81 mg by mouth daily.  . carbidopa-levodopa (SINEMET IR) 25-100 MG tablet Take 1.5 tablets by mouth 3 (three) times daily.  . Cholecalciferol (VITAMIN D3) 2000 UNITS capsule Take by mouth.  . hydroxyurea (HYDREA) 500 MG capsule TAKE 2 CAPSULES BY MOUTH ON MONDAY, WEDNESDAYS, AND FRIDAYS AND 1 CAPSULE BY MOUTH EVERY DAY FOR OTHER DAYS OF THE WEEK  . lisinopril (PRINIVIL,ZESTRIL) 20 MG tablet Take 20 mg by mouth.  . sertraline (ZOLOFT) 50 MG tablet    No facility-administered encounter medications on file as of 07/30/2017.     PAST MEDICAL HISTORY:   Past Medical History:  Diagnosis Date  . Colon polyp 05/2003  . Depression with anxiety   . HTN (hypertension)   . Insomnia   . Need for prophylactic vaccination and inoculation against influenza 01/14/2013  . Polycythemia   . 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 Thrombocytosis ([place])   . Tremor 11/16/2013  . Varicose vein   . Vitamin D deficiency   . Vulvar candidiasis   . Weight loss 11/16/2013    PAST SURGICAL HISTORY:   Past Surgical History:  Procedure Laterality Date  . NO PAST SURGERIES       SOCIAL HISTORY:   Social History   Socioeconomic History  . Marital status: Married    Spouse name: Not on file  . Number of children: 2  . Years of education: Not on file  . Highest education level: Not on file  Occupational History    Employer: UNEMPLOYED    Comment: Housewife  Social Needs  . Financial resource strain: Not on file  . Food insecurity:    Worry: Not on file    Inability: Not on file  . Transportation needs:    Medical: Not on file    Non-medical: Not on file  Tobacco Use  . Smoking status: Never Smoker  . Smokeless tobacco: Never Used  Substance and Sexual Activity  .  Alcohol use: No  . Drug use: No  . Sexual activity: Never    Birth control/protection: None  Lifestyle  . Physical activity:    Days per week: Not on file    Minutes per session: Not on file  . Stress: Not on file  Relationships  . Social connections:    Talks on phone: Not on file    Gets together: Not on file    Attends religious service: Not on file    Active member of club or organization: Not on file    Attends meetings of clubs or organizations: Not on file    Relationship status: Not on file  . Intimate partner violence:    Fear of current or ex partner: Not on file    Emotionally abused: Not on file    Physically abused: Not on file    Forced sexual activity: Not on file  Other Topics Concern  . Not on file  Social History Narrative  . Not on file    FAMILY HISTORY:   Family Status  Relation Name Status  . Mother  Deceased       HTN  . Father  Deceased       mi, clotting disorder  . Sister  Alive  . Brother  Alive  . Annamarie Major  Deceased  . Brother  Alive  . Brother  Alive    ROS:  A complete 10 system review of systems was obtained and was unremarkable apart from what is mentioned above.  PHYSICAL EXAMINATION:    VITALS:   Vitals:   07/30/17 1528  BP: 130/84  Pulse: 74  SpO2: 99%  Weight: 136 lb 2 oz (61.7 kg)  Height: 5\' 2"  (1.575 m)    GEN:   The patient appears stated age and is in NAD. HEENT:  Normocephalic, atraumatic.  The mucous membranes are moist. The superficial temporal arteries are without ropiness or tenderness. CV:  RRR Lungs:  CTAB Neck/HEME:  There are no carotid bruits bilaterally.  Neurological examination:  Orientation: The patient is alert and oriented x3.  Cranial nerves: There is good facial symmetry.  There is very little facial hypomania now.   The speech is fluent and clear. Soft palate rises symmetrically and there is no tongue deviation. Hearing is intact to conversational tone. Sensation: Sensation is intact to light touch throughout Motor: Strength is 5/5 in the upper and lower extremity's.  Movement examination: Tone: There is normal tone. Abnormal movements: There is left greater than right upper extremity resting tremor.  Just a little tremor is seen in the right thumb. Coordination:  There is no decremation with any form of RAMS, including alternating supination and pronation of the forearm, hand opening and closing, finger taps, heel taps and toe taps. Gait and Station: The patient has no difficulty arising out of a deep-seated chair without the use of the hands. The patient's stride length is normal.  Mild tremor of the left hand is noted when she ambulates.  ASSESSMENT/PLAN:  1.  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  -doing well on carbidopa/levodopa 25/100, 1.5 tablets tid.  -Patient will continue with rock steady boxing.  -Daughter asked about other programs in the area.  Given information and all of the exercise programs.  Given specifics on rock steady boxing and scholarship programs available, at their request.  -Invited Parkinson's symposium. 2.  Hyperreflexia   -insurance denied MRI  brain 3.  RBD  -Long counseling session with the patient and her daughter today about the fact that this can  actually be dangerous.  Ultimately, they decided to hold on clonazepam.  Discussed this again today and came to the same conclusion.  Her daughter thought that she had taken this years ago when the patient's husband first died and was not sure that she tolerated it well.  We can discuss this in the future if it seems to progress. 4.  Sialorrhea  -This is commonly associated with PD.  We talked about treatments.  The patient is not a candidate for oral anticholinergic therapy because of increased risk of confusion and falls.  We discussed myobloc and 1% atropine drops.  We discusssed that candy like lemon drops can help by stimulating mm of the oropharynx to induce swallowing.  She doesn't want any medication right now. 5.  She will follow-up with me in the next 5 months, sooner should new neurologic issues arise.

## 2017-07-30 ENCOUNTER — Encounter: Payer: Self-pay | Admitting: Neurology

## 2017-07-30 ENCOUNTER — Ambulatory Visit (INDEPENDENT_AMBULATORY_CARE_PROVIDER_SITE_OTHER): Payer: Medicare Other | Admitting: Neurology

## 2017-07-30 VITALS — BP 130/84 | HR 74 | Ht 62.0 in | Wt 136.1 lb

## 2017-07-30 DIAGNOSIS — G4752 REM sleep behavior disorder: Secondary | ICD-10-CM | POA: Diagnosis not present

## 2017-07-30 DIAGNOSIS — G20 Parkinson's disease: Secondary | ICD-10-CM

## 2017-07-30 NOTE — Patient Instructions (Addendum)
Powering Together for [employer] & Movement Disorders  The [facility] Parkinson's and Movement Disorders team know that living well with a movement disorder extends far beyond our clinic walls. We are together with you. Our team is passionate about providing resources to you and your loved ones who are living with Parkinson's disease and movement disorders. Participate in these programs and join our community. These resources are free or low cost!    Parkinson's and Movement Disorders Program is adding:   Innovative educational programs for patients and caregivers.   Support groups for patients and caregivers living with Parkinson's disease.   Parkinson's specific exercise programs.   Custom tailored therapeutic programs that will benefit patient's living with Parkinson's disease.   We are in this together. You can help and contribute to grow these programs and resources in our community. 100% of the funds donated to the [facility] stays right here in our community to support patients and their caregivers.  To make a tax deductible contribution:  -ask for a Power Together for Parkinson's envelope in the office today.  - call the Office of Institutional Advancement at [phone].     Registration is OPEN!    Third Annual Parkinson's Education Symposium   To register: [email or web address]      Search:  [employer] person attending individually Questions: [place], [place] or Janett [email or web address]   Community Parkinson's Exercise Programs   Parkinson's Wellness Recovery Exercise Programs:   PWR! Moves PD Exercise Class:  This is a therapist-led exercise class for people with Parkinson's disease in the [place] community. It consists of a one-hour exercise class each week. Classes are offered in eight-week sessions, and the cost per session is $80. Class size is limited  to a maximum of 20 participants. Participant criteria includes: Participant must be able to get up and down from the floor with minimal to no assistance, have had 0-1 falls in the past 6 months, and have completed physical or occupational therapy at [facility] within the past year.  To find out more about session dates, questions, or to register, please contact Mady Haagensen, Physical Therapist, or Nita Sells, Physical Brewing technologist, at [facility] at [phone].  PWR! Circuit Class:  This is a therapist-led exercise class with intervals of circuit activities incorporating PWR! Moves into functional activities. It consists of one 45-minute exercise class per week. Classes are offered in eight-week sessions, and the cost per session is $120. Class size is limited to a maximum of eight participants to allow for hands-on instruction. Participant criteria: class is ideal for people with Parkinson's disease who have completed PWR! Moves Exercise Class or who are currently independently exercising and want to be challenged, must be able to walk independently with 0-1 falls in the past 6 months, able to get up and down from the floor independently, able to sit to stand independently, and able to jog 20 feet.   To find out more about session dates, questions, or to register, please contact Mady Haagensen, Physical Therapist, or Nita Sells, Physical Brewing technologist, at [facility] at [phone].   [employer] Parkinson's Cycle:   Parkinson's Cycle Class at [facility] This is an ongoing class on Monday and Thursday mornings at 10:45 a.m. A healthcare provider referral is required to enroll. This class is FREE to participants, and you do not have to be a member of the [employer] to enroll. Contact Beth at [phone]  or [email or web address]. Parkinson's Cycle Class at [facility] Ongoing Class  Monday, Wednesday, and Friday mornings at 9:00 a.m. A healthcare provider referral is required to enroll. This class is FREE to participants, and you do not have to be a member of the [employer] to enroll. Contact Marlee at [phone] or [email or web address]. Parkinson's Cycle Class at [facility] Ongoing Class every Friday mornings at 12 p.m.  A healthcare provider referral is required to enroll. This class is FREE to participants, and you do not have to be a member of the [employer] to enroll. Contact [phone].  Parkinson's Cycle Class at [facility] Ongoing Class every Monday at 12pm.  A healthcare provider referral is required to enroll. This class is FREE to participants, and you do not have to be a member of the [employer] to enroll. Contact Almyra Free at [phone] or  [email or web address].   [employer]:  [employer]  Classes are offered Mondays at 5:15 p.m. and Tuesdays and Thursdays at 12 p.m. at [employer]. For more information, contact [phone] or visit [email or web address] or [email or web address]. [employer] [place] Classes are offered Monday, Wednesday, and Friday from 9:30 a.m. - 11:00 a.m. For more information, contact [phone] or [phone] or email [email or web address] or visit [email or web address] or [email or web address]. [employer] (classes are offered at 2 locations) . [facility] [facility] [employer] in [place] (for more information, contact Byron at [phone] or email [email or web address] . Cathren Laine at [facility] (class is open to the public -- for more information, contact Clabe Seal at [phone] or email [email or web address]) [place] are held at [facility] in [place], [place]. For more information, call Dr. Bing Plume at [phone] or [email or web address].   Personal Training for  Parkinson's:   ACT Offers certified personal training to customize a program to meet your exercise needs to address Parkinson's disease. For more information, contact [phone] or visit [email or web address].  Community Dance for Parkinson's:   Community dance class for people with Parkinson's Disease Wednesdays at 9 a.m. [employer] [address], [place] [address] Please contact Eliberto Ivory [phone] for more information  Scholarships Available for Fitness Programs:  The [place] for [employer] is a non-profit 501(C)3 organization run by volunteers, whose mission is to strive to empower those living with Parkinson's Disease (PD), Progressive Supra-Nuclear Palsy (PSP) and Multiple System Atrophy ([place]).  Through financial support, recipients benefit from individual and group programs. [phone] [email or web address]

## 2017-08-01 ENCOUNTER — Other Ambulatory Visit: Payer: Self-pay | Admitting: Family Medicine

## 2017-08-01 DIAGNOSIS — Z1231 Encounter for screening mammogram for malignant neoplasm of breast: Secondary | ICD-10-CM

## 2017-10-04 ENCOUNTER — Ambulatory Visit
Admission: RE | Admit: 2017-10-04 | Discharge: 2017-10-04 | Disposition: A | Discharge status: Home / Self Care | Payer: Medicare Other | Source: Ambulatory Visit | Attending: Family Medicine | Admitting: Family Medicine

## 2017-10-04 DIAGNOSIS — Z1231 Encounter for screening mammogram for malignant neoplasm of breast: Secondary | ICD-10-CM

## 2017-12-03 ENCOUNTER — Inpatient Hospital Stay: Payer: Medicare Other | Attending: Hematology and Oncology

## 2017-12-03 ENCOUNTER — Encounter: Payer: Self-pay | Admitting: Hematology and Oncology

## 2017-12-03 ENCOUNTER — Telehealth: Payer: Self-pay | Admitting: Hematology and Oncology

## 2017-12-03 ENCOUNTER — Inpatient Hospital Stay (HOSPITAL_BASED_OUTPATIENT_CLINIC_OR_DEPARTMENT_OTHER): Payer: Medicare Other | Admitting: Hematology and Oncology

## 2017-12-03 ENCOUNTER — Other Ambulatory Visit: Payer: Self-pay | Admitting: Hematology and Oncology

## 2017-12-03 DIAGNOSIS — D45 Polycythemia vera: Secondary | ICD-10-CM

## 2017-12-03 DIAGNOSIS — I10 Essential (primary) hypertension: Secondary | ICD-10-CM | POA: Diagnosis not present

## 2017-12-03 DIAGNOSIS — Z7982 Long term (current) use of aspirin: Secondary | ICD-10-CM | POA: Insufficient documentation

## 2017-12-03 DIAGNOSIS — G20 Parkinson's disease: Secondary | ICD-10-CM | POA: Insufficient documentation

## 2017-12-03 DIAGNOSIS — Z23 Encounter for immunization: Secondary | ICD-10-CM | POA: Insufficient documentation

## 2017-12-03 LAB — CBC WITH DIFFERENTIAL/PLATELET
Basophils Absolute: 0.1 10*3/uL (ref 0.0–0.1)
Basophils Relative: 1 %
Eosinophils Absolute: 0.3 10*3/uL (ref 0.0–0.5)
Eosinophils Relative: 5 %
HCT: 43.6 % (ref 34.8–46.6)
Hemoglobin: 13.9 g/dL (ref 11.6–15.9)
Lymphocytes Relative: 28 %
Lymphs Abs: 1.5 10*3/uL (ref 0.9–3.3)
MCH: 32.1 pg (ref 25.1–34.0)
MCHC: 31.9 g/dL (ref 31.5–36.0)
MCV: 100.7 fL (ref 79.5–101.0)
Monocytes Absolute: 0.4 10*3/uL (ref 0.1–0.9)
Monocytes Relative: 7 %
Neutro Abs: 3.3 10*3/uL (ref 1.5–6.5)
Neutrophils Relative %: 59 %
Platelets: 445 10*3/uL — ABNORMAL HIGH (ref 145–400)
RBC: 4.33 MIL/uL (ref 3.70–5.45)
RDW: 14.1 % (ref 11.2–14.5)
WBC: 5.4 10*3/uL (ref 3.9–10.3)

## 2017-12-03 MED ORDER — INFLUENZA VAC SPLIT QUAD 0.5 ML IM SUSY
0.5000 mL | PREFILLED_SYRINGE | Freq: Once | INTRAMUSCULAR | Status: AC
  Administered 2017-12-03: 0.5 mL via INTRAMUSCULAR

## 2017-12-03 MED ORDER — HYDROXYUREA 500 MG PO CAPS: ORAL_CAPSULE | ORAL | 6 refills | Status: DC

## 2017-12-03 MED ORDER — INFLUENZA VAC SPLIT QUAD 0.5 ML IM SUSY
PREFILLED_SYRINGE | INTRAMUSCULAR | Status: AC
  Filled 2017-12-03: qty 0.5

## 2017-12-03 NOTE — Telephone Encounter (Signed)
Gave avs and calendar ° °

## 2017-12-03 NOTE — Assessment & Plan Note (Signed)
She is currently on treatment and follows with neurologist closely. Overall, she felt that her tremors have improved. I would defer to them for further management 

## 2017-12-03 NOTE — Progress Notes (Signed)
[facility] OFFICE PROGRESS NOTE  Patient Care Team: [name], MD as PCP - General (Family Medicine) Christy Sartorius, MD as Referring Physician (Urology) Avel Sensor, MD as Attending Physician (Obstetrics and Gynecology) Heath Lark, MD as Consulting Physician (Hematology and Oncology) Tat, Eustace Quail, DO as Consulting Physician (Neurology)  ASSESSMENT & PLAN:  Polycythemia vera The patient has been taking her medications correctly but has occasionally missed her doses. CBC today showed thrombocytosis. Due to missed doses, I do not feel strongly I have to modify her dosing. We will continue the same dose of hydroxyurea, to be taken at 1000 mg on Mondays, Wednesdays and Fridays and to take 500 mg for the rest of the week She will continue to take aspirin 81 mg daily Plan to see her back in 6 months, but I did recommend she have her blood checked again with her primary care doctor in 3 months and to have the results forwarded to me. We discussed the importance of preventive care and reviewed the vaccination programs. She does not have any prior allergic reactions to influenza vaccination. She agrees to proceed with influenza vaccination today and we will administer it today at the clinic.   Parkinson disease [facility]) She is currently on treatment and follows with neurologist closely. Overall, she felt that her tremors have improved. I would defer to them for further management  Essential hypertension she will continue current medical management. I recommend close follow-up with primary care doctor for medication adjustment. Her elevated blood pressure today could be related to anxiety/whitecoat hypertension.  She is not symptomatic.   No orders of the defined types were placed in this encounter.   INTERVAL HISTORY: Please see below for problem oriented charting. She returns with her daughter for further follow-up She stated she has missed occasional  doses of hydroxyurea Denies recent infection, fever or chills The patient denies any recent signs or symptoms of bleeding such as spontaneous epistaxis, hematuria or hematochezia. 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 The patient never had bone marrow aspirate and biopsy. She was started on hydroxyurea and dose was adjusted due to hair thinning and fatigue On 09/30/2013, hydroxyurea dose was increased to 1000 mg on Mondays, Wednesdays and Fridays and 2 take 500 mg the rest of the week. On 10/22/2013, the dose of hydroxyurea was increased to 1000 mg daily along with phlebotomy. On August 2015, the dose of hydroxyurea was modified to 1000 mg daily Mondays to Fridays and 500 mg on Saturdays and Sunday.  However, the patient was not following instruction correctly. On 07/20/2014, the dose of hydroxyurea was modifed to 500 mg daily Mondays to Fridays and 1000 mg on Saturdays and Sundays On 02/08/2015, the dose of hydroxyurea was modified to 500 milligrams daily Mondays to Saturdays and 1000 mg on Sundays. She was referred to neurologist in 2016 and was diagnosed with Parkinson's disease On 07/17/2016, the dose of hydroxyurea is adjusted.  She takes 500 mg daily except for 2 days a week on Wednesdays and Sundays she take 1000 mg Starting 11/14/2016, the dose of hydroxyurea is suggested.  She is instructed to take 1000 mg on Mondays, Wednesdays and Fridays and take 500 mg daily for the rest of the week  REVIEW OF SYSTEMS:   Constitutional: Denies fevers, chills or abnormal weight loss Eyes:  Denies blurriness of vision Ears, nose, mouth, throat, and face: Denies mucositis or sore throat Respiratory: Denies cough, dyspnea or wheezes Cardiovascular: Denies palpitation, chest discomfort or lower extremity  swelling Gastrointestinal:  Denies nausea, heartburn or change in bowel habits Skin: Denies abnormal skin rashes Lymphatics: Denies new lymphadenopathy or easy bruising Neurological:Denies numbness, tingling or new weaknesses Behavioral/Psych: Mood is stable, no new changes  All other systems were reviewed with the patient and are negative.  I have reviewed the past medical history, past surgical history, social history and family history with the patient and they are unchanged from previous note.  ALLERGIES:  has No Known Allergies.  MEDICATIONS:  Current Outpatient Medications  Medication Sig Dispense Refill  . aspirin 81 MG tablet Take 81 mg by mouth daily.    . carbidopa-levodopa (SINEMET IR) 25-100 MG tablet Take 1.5 tablets by mouth 3 (three) times daily. 405 tablet 1  . Cholecalciferol (VITAMIN D3) 2000 UNITS capsule Take by mouth.    . hydroxyurea (HYDREA) 500 MG capsule TAKE 2 CAPSULES BY MOUTH ON MONDAY, WEDNESDAYS, AND FRIDAYS AND 1 CAPSULE BY MOUTH EVERY DAY FOR OTHER DAYS OF THE WEEK 100 capsule 6  . lisinopril (PRINIVIL,ZESTRIL) 20 MG tablet Take 20 mg by mouth.    . sertraline (ZOLOFT) 50 MG tablet      No current facility-administered medications for this visit.     PHYSICAL EXAMINATION: ECOG PERFORMANCE STATUS: 1 - Symptomatic but completely ambulatory  Vitals:   12/03/17 1528  BP: (!) 153/93  Pulse: 82  Resp: 18  Temp: 98.2 F (36.8 C)  SpO2: 100%   Filed Weights   12/03/17 1528  Weight: 139 lb (63 kg)    GENERAL:alert, no distress and comfortable SKIN: skin color, texture, turgor are normal, no rashes or significant lesions EYES: normal, Conjunctiva are pink and non-injected, sclera clear OROPHARYNX:no exudate, no erythema and lips, buccal mucosa, and tongue normal  NECK: supple, thyroid normal size, non-tender, without nodularity LYMPH:  no palpable lymphadenopathy in the cervical, axillary or inguinal LUNGS: clear to auscultation and percussion  with normal breathing effort HEART: regular rate & rhythm and no murmurs and no lower extremity edema ABDOMEN:abdomen soft, non-tender and normal bowel sounds Musculoskeletal:no cyanosis of digits and no clubbing  NEURO: alert & oriented x 3 with fluent speech, no focal motor/sensory deficits.  I noticed less tremor than prior exam  LABORATORY DATA:  I have reviewed the data as listed    Component Value Date/Time   NA 139 11/16/2013 1404   K 4.0 11/16/2013 1404   CL 102 07/30/2012 1419   CO2 28 11/16/2013 1404   GLUCOSE 89 11/16/2013 1404   GLUCOSE 102 (H) 07/30/2012 1419   BUN 30.0 (H) 11/16/2013 1404   CREATININE 0.9 11/16/2013 1404   CALCIUM 9.1 11/16/2013 1404   PROT 6.7 11/16/2013 1404   ALBUMIN 4.0 11/16/2013 1404   AST 15 11/16/2013 1404   ALT <6 11/16/2013 1404   ALKPHOS 49 11/16/2013 1404   BILITOT 1.45 (H) 11/16/2013 1404   GFRNONAA 58 (L) 11/22/2009 1855   GFRAA  11/22/2009 1855    >60        The eGFR has been calculated using the MDRD equation. This calculation has not been validated in all clinical situations. eGFR's persistently <60 mL/min signify possible Chronic Kidney Disease.    No results found for: SPEP, UPEP  Lab Results  Component Value Date   WBC 5.4 12/03/2017   NEUTROABS 3.3 12/03/2017   HGB  13.9 12/03/2017   HCT 43.6 12/03/2017   MCV 100.7 12/03/2017   PLT 445 (H) 12/03/2017      Chemistry      Component Value Date/Time   NA 139 11/16/2013 1404   K 4.0 11/16/2013 1404   CL 102 07/30/2012 1419   CO2 28 11/16/2013 1404   BUN 30.0 (H) 11/16/2013 1404   CREATININE 0.9 11/16/2013 1404      Component Value Date/Time   CALCIUM 9.1 11/16/2013 1404   ALKPHOS 49 11/16/2013 1404   AST 15 11/16/2013 1404   ALT <6 11/16/2013 1404   BILITOT 1.45 (H) 11/16/2013 1404       All questions were answered. The patient knows to call the clinic with any problems, questions or concerns. No barriers to learning was detected.  I spent 15 minutes  counseling the patient face to face. The total time spent in the appointment was 20 minutes and more than 50% was on counseling and review of test results  Heath Lark, MD 12/03/2017 5:48 PM

## 2017-12-03 NOTE — Assessment & Plan Note (Signed)
she will continue current medical management. I recommend close follow-up with primary care doctor for medication adjustment. Her elevated blood pressure today could be related to anxiety/whitecoat hypertension.  She is not symptomatic. 

## 2017-12-03 NOTE — Assessment & Plan Note (Signed)
The patient has been taking her medications correctly but has occasionally missed her doses. CBC today showed thrombocytosis. Due to missed doses, I do not feel strongly I have to modify her dosing. We will continue the same dose of hydroxyurea, to be taken at 1000 mg on Mondays, Wednesdays and Fridays and to take 500 mg for the rest of the week She will continue to take aspirin 81 mg daily Plan to see her back in 6 months, but I did recommend she have her blood checked again with her primary care doctor in 3 months and to have the results forwarded to me. We discussed the importance of preventive care and reviewed the vaccination programs. She does not have any prior allergic reactions to influenza vaccination. She agrees to proceed with influenza vaccination today and we will administer it today at the clinic.

## 2017-12-12 ENCOUNTER — Ambulatory Visit: Payer: Medicare Other | Admitting: Hematology and Oncology

## 2017-12-12 ENCOUNTER — Other Ambulatory Visit: Payer: Medicare Other

## 2017-12-23 ENCOUNTER — Other Ambulatory Visit: Payer: Self-pay | Admitting: Neurology

## 2018-01-10 NOTE — Progress Notes (Signed)
Carolyn Figueroa was seen today in the movement disorders clinic for neurologic consultation at the request of Dr. Alvy Bimler.  Her PCP is [name], MD.  The consultation is for the evaluation of tremor.  This patient is accompanied in the office by her child who supplements the history.  The records that were made available to me were reviewed.  Her records indicate that she has had tremor at least since August, 2015.  She was referred to my office earlier this year but was unable to make the appointment.  Tremor started in the right hand but recently migrated to the L hand; she initially didn't notice it at all and would deny it was happening but now she is noting trouble with putting spices in food because of tremor.  Her daughter sees tremor at rest and has for a while.  There is no fam hx of tremor.  She is R hand dominant.  She c/o that her strength is not as good and has trouble pouring a big pot of soup or water.  05/31/15 update:  The patient follows up today, accompanied by her daughter who supplements the history.  She started on levodopa last visit.  She denies any falls since last visit. States that she is walking in her neighborhood faithfully for exercise.  Having much less tremor and overall feeling better.  No hallucinations.  No lightheadedness or near syncope.  She refused Parkinson's therapies.  Her insurance denied her MRI of the brain.  09/30/15 update:  The patient follows up today.  She is accompanied by her daughter supplements the history.  I have reviewed previous records since our last visit.  She saw Dr. Alvy Bimler such and her polycythemia has been stable.  She remains on carbidopa/levodopa 25/100, one tablet 3 times per day.  She had 1 fall on vacation while walking down a steep incline to the beach.  No lightheadedness or near syncope.  No hallucinations.  She did go to [place] since our last visit and had a great time.  Having cough.  Wonders if from PD vs  lisinopril.  02/21/16 update:  Pt f/u today, accompanied by her daughter who supplements the hx.  She is on carbidopa/levodopa 25/100 tid and doing well.  Pt denies falls.  Pt denies lightheadedness, near syncope.  No hallucinations.  Mood has been good.  She is doing exercise at the [facility] 3 times a week with the Parkinson's biking program.  Has noticed some stiffness of the right arm.  06/05/16 update:  Patient follows up today, accompanied by her daughter who supplements the history.  Last visit, I recommended that she increase her carbidopa/levodopa 25/100 to a total of 1-1/2 tablets 3 times per day.  She reports that she has been doing well with this dose.  She is exercising with the Parkinson's bicycle program.  She is doing better with the rigidity in her arm.  She is not falling.  She denies lightheadedness or near syncope.  No hallucinations.  She does act out the dreams.  She doesn't fall out of the bed.  Noting some increased drooling.  09/25/16 update:  Pt seen in f/u.  This patient is accompanied in the office by her daughter who supplements the history.  She is on carbidopa/levodopa 25/100, 1.5 tablets tid.  She is doing well, without SE.  Pt had one fall when she tripped over mini trampoline of her grandchild and fell and hit shoulder.  No LOC.  Pt  denies lightheadedness, near syncope.  No hallucinations.  Mood has been good.  02/26/17 update: Patient seen in follow-up for her Parkinson's disease.  She is accompanied by her daughter who supplements the history.  She remains on carbidopa/levodopa 25/100, 1.5 tablet 3 times per day.  Pt denies falls.  Pt denies lightheadedness, near syncope.  No hallucinations.  Mood has been good.  She is exercising 3 days/week.  She is occasionally acting at the dreams, but that usually just means yelling out in her sleep.  She has not fallen out of the bed.  She has not done anything dangerous.  07/30/17 update: Patient is seen today in follow-up for  Parkinson's disease.  She is accompanied by her daughter who supplements the history.  Patient is on carbidopa/levodopa 25/100, 1-1/2 tablets 3 times per day.  She has had no falls.  No lightheadedness or near syncope.  No hallucinations.  No visual distortions.  Her mood has been good.  She continues to exercise regularly.  In regards to acting out of the dreams, she states that she is having bad dreams but not acting them out.  I have reviewed records since our last visit.  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  She is on hydroxyurea for that.  Daughter asks about programs like rock steady boxing.  01/14/18 update: Patient is seen today in follow-up for Parkinson's disease, accompanied by her daughter who supplements history.  Patient is on carbidopa/levodopa 25/100, 1.5 tablets 3 times per day.  She has had no falls.  She has had no lightheadedness or near syncope.  She has had unpleasant dreams, but has not been acting them out as far she knows.  Her daughter states that she will shout.  She is exercising at the [facility].   She saw Dr. Alvy Bimler on September 10.  Those records have been reviewed.  No medication changes were made.  Neuroimaging has not previously been performed.    PREVIOUS MEDICATIONS: none to date  ALLERGIES:  No Known Allergies  CURRENT MEDICATIONS:  Outpatient Encounter Medications as of 01/14/2018  Medication Sig  . aspirin 81 MG tablet Take 81 mg by mouth daily.  . carbidopa-levodopa (SINEMET IR) 25-100 MG tablet TAKE 1.5 TABLETS BY MOUTH THREE TIMES DAILY  . Cholecalciferol (VITAMIN D3) 2000 UNITS capsule Take by mouth.  . hydroxyurea (HYDREA) 500 MG capsule TAKE 2 CAPSULES BY MOUTH ON MONDAY, WEDNESDAYS, AND FRIDAYS AND 1 CAPSULE BY MOUTH EVERY DAY FOR OTHER DAYS OF THE WEEK  . lisinopril (PRINIVIL,ZESTRIL) 20 MG tablet Take 20 mg by mouth.  . sertraline (ZOLOFT) 50 MG tablet    No facility-administered encounter medications on file as of 01/14/2018.     PAST  MEDICAL HISTORY:   Past Medical History:  Diagnosis Date  . Colon polyp 05/2003  . Depression with anxiety   . HTN (hypertension)   . Insomnia   . Need for prophylactic vaccination and inoculation against influenza 01/14/2013  . Polycythemia   . 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 Thrombocytosis ([place])   . Tremor 11/16/2013  . Varicose vein   . Vitamin D deficiency   . Vulvar candidiasis   . Weight loss 11/16/2013    PAST SURGICAL HISTORY:   Past Surgical History:  Procedure Laterality Date  . NO PAST SURGERIES      SOCIAL HISTORY:   Social History   Socioeconomic History  . Marital status: Married    Spouse name: Not on file  . Number of children: 2  .  Years of education: Not on file  . Highest education level: Not on file  Occupational History    Employer: UNEMPLOYED    Comment: Housewife  Social Needs  . Financial resource strain: Not on file  . Food insecurity:    Worry: Not on file    Inability: Not on file  . Transportation needs:    Medical: Not on file    Non-medical: Not on file  Tobacco Use  . Smoking status: Never Smoker  . Smokeless tobacco: Never Used  Substance and Sexual Activity  . Alcohol use: No  . Drug use: No  . Sexual activity: Never    Birth control/protection: None  Lifestyle  . Physical activity:    Days per week: Not on file    Minutes per session: Not on file  . Stress: Not on file  Relationships  . Social connections:    Talks on phone: Not on file    Gets together: Not on file    Attends religious service: Not on file    Active member of club or organization: Not on file    Attends meetings of clubs or organizations: Not on file    Relationship status: Not on file  . Intimate partner violence:    Fear of current or ex partner: Not on file    Emotionally abused: Not on file    Physically abused: Not on file    Forced sexual activity: Not on file  Other Topics Concern  . Not on file  Social History Narrative  . Not on  file    FAMILY HISTORY:   Family Status  Relation Name Status  . Mother  Deceased       HTN  . Father  Deceased       mi, clotting disorder  . Sister  Alive  . Brother  Alive  . Annamarie Major  Deceased  . Brother  Alive  . Brother  Alive    ROS:  Review of Systems  Constitutional: Negative.   HENT: Negative.   Eyes: Negative.   Respiratory: Negative.   Cardiovascular: Negative.   Gastrointestinal: Negative.   Skin: Negative.   Neurological: Negative.   Endo/Heme/Allergies: Negative.      PHYSICAL EXAMINATION:    VITALS:   Vitals:   01/14/18 1458  BP: 110/75  Pulse: 80  Weight: 140 lb (63.5 kg)  Height: 5\' 2"  (1.575 m)    GEN:  The patient appears stated age and is in NAD. HEENT:  Normocephalic, atraumatic.  The mucous membranes are moist. The superficial temporal arteries are without ropiness or tenderness. CV:  RRR Lungs:  CTAB Neck/HEME:  There are no carotid bruits bilaterally.  Neurological examination:  Orientation: The patient is alert and oriented x3.  Cranial nerves: There is good facial symmetry.  There is very little facial hypomania now.   The speech is fluent and clear. Soft palate rises symmetrically and there is no tongue deviation. Hearing is intact to conversational tone. Sensation: Sensation is intact to light touch throughout Motor: Strength is 5/5 in the upper and lower extremity's.  Movement examination: Tone: There is trace rigidity in the right upper extremity.   Abnormal movements: There is left greater than right upper extremity resting tremor.  Just a little tremor is seen in the right thumb. Coordination:  There is no decremation with any form of RAMS, including alternating supination and pronation of the forearm, hand opening and closing, finger taps, heel taps and toe taps. Gait  and Station: The patient has no difficulty arising out of a deep-seated chair without the use of the hands. The patient's stride length is normal.  Mild tremor  of the left hand is noted when she ambulates.  ASSESSMENT/PLAN:  1.  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  -doing well on carbidopa/levodopa 25/100, 1.5 tablets tid.  Talked about increasing due to mild rigidity but she moves well and feels well so opted to continue the same dosage  -continue exercise  2.  Hyperreflexia   -insurance denied MRI brain 3.  RBD  -doesn't want med right now.   4.  Sialorrhea  -This is commonly associated with PD.  We talked about treatments.  The patient is not a candidate for oral anticholinergic therapy because of increased risk of confusion and falls.  We discussed myobloc and 1% atropine drops.  We discusssed that candy like lemon drops can help by stimulating mm of the oropharynx to induce swallowing.  She doesn't want any medication right now. 5.  F/u 5-6 months

## 2018-01-14 ENCOUNTER — Encounter: Payer: Self-pay | Admitting: Neurology

## 2018-01-14 ENCOUNTER — Ambulatory Visit (INDEPENDENT_AMBULATORY_CARE_PROVIDER_SITE_OTHER): Payer: Medicare Other | Admitting: Neurology

## 2018-01-14 VITALS — BP 110/75 | HR 80 | Ht 62.0 in | Wt 140.0 lb

## 2018-01-14 DIAGNOSIS — K117 Disturbances of salivary secretion: Secondary | ICD-10-CM

## 2018-01-14 DIAGNOSIS — G20 Parkinson's disease: Secondary | ICD-10-CM

## 2018-01-14 DIAGNOSIS — G4752 REM sleep behavior disorder: Secondary | ICD-10-CM | POA: Diagnosis not present

## 2018-06-03 ENCOUNTER — Other Ambulatory Visit: Payer: Medicare Other

## 2018-06-03 ENCOUNTER — Ambulatory Visit: Payer: Medicare Other | Admitting: Hematology and Oncology

## 2018-06-17 ENCOUNTER — Other Ambulatory Visit: Payer: Self-pay

## 2018-06-17 ENCOUNTER — Inpatient Hospital Stay (HOSPITAL_BASED_OUTPATIENT_CLINIC_OR_DEPARTMENT_OTHER): Payer: Medicare Other | Admitting: Hematology and Oncology

## 2018-06-17 ENCOUNTER — Telehealth: Payer: Self-pay | Admitting: Hematology and Oncology

## 2018-06-17 ENCOUNTER — Inpatient Hospital Stay: Payer: Medicare Other | Attending: Hematology and Oncology

## 2018-06-17 ENCOUNTER — Encounter: Payer: Self-pay | Admitting: Hematology and Oncology

## 2018-06-17 DIAGNOSIS — D45 Polycythemia vera: Secondary | ICD-10-CM

## 2018-06-17 DIAGNOSIS — G20 Parkinson's disease: Secondary | ICD-10-CM | POA: Insufficient documentation

## 2018-06-17 LAB — CBC WITH DIFFERENTIAL/PLATELET
Abs Immature Granulocytes: 0.01 10*3/uL (ref 0.00–0.07)
Basophils Absolute: 0 10*3/uL (ref 0.0–0.1)
Basophils Relative: 1 %
Eosinophils Absolute: 0.2 10*3/uL (ref 0.0–0.5)
Eosinophils Relative: 4 %
HCT: 42.1 % (ref 36.0–46.0)
Hemoglobin: 13.1 g/dL (ref 12.0–15.0)
Immature Granulocytes: 0 %
Lymphocytes Relative: 28 %
Lymphs Abs: 1.3 10*3/uL (ref 0.7–4.0)
MCH: 33.2 pg (ref 26.0–34.0)
MCHC: 31.1 g/dL (ref 30.0–36.0)
MCV: 106.9 fL — ABNORMAL HIGH (ref 80.0–100.0)
Monocytes Absolute: 0.3 10*3/uL (ref 0.1–1.0)
Monocytes Relative: 6 %
Neutro Abs: 2.9 10*3/uL (ref 1.7–7.7)
Neutrophils Relative %: 61 %
Platelets: 401 10*3/uL — ABNORMAL HIGH (ref 150–400)
RBC: 3.94 MIL/uL (ref 3.87–5.11)
RDW: 13.4 % (ref 11.5–15.5)
WBC: 4.7 10*3/uL (ref 4.0–10.5)
nRBC: 0 % (ref 0.0–0.2)

## 2018-06-17 MED ORDER — HYDROXYUREA 500 MG PO CAPS: ORAL_CAPSULE | ORAL | 6 refills | Status: DC

## 2018-06-17 NOTE — Assessment & Plan Note (Signed)
The patient has been taking her medications correctly b CBC today showed minimal thrombocytosis. We will continue the same dose of hydroxyurea, to be taken at 1000 mg on Mondays, Wednesdays and Fridays and to take 500 mg for the rest of the week She will continue to take aspirin 81 mg daily Plan to see her back in 6 months

## 2018-06-17 NOTE — Telephone Encounter (Signed)
Gave avs and calendar ° °

## 2018-06-17 NOTE — Progress Notes (Signed)
[place] OFFICE PROGRESS NOTE  Patient Care Team: [name], MD as PCP - General (Family Medicine) Christy Sartorius, MD as Referring Physician (Urology) Avel Sensor, MD as Attending Physician (Obstetrics and Gynecology) Heath Lark, MD as Consulting Physician (Hematology and Oncology) Tat, Eustace Quail, DO as Consulting Physician (Neurology)  ASSESSMENT & PLAN:  Polycythemia vera The patient has been taking her medications correctly b CBC today showed minimal thrombocytosis. We will continue the same dose of hydroxyurea, to be taken at 1000 mg on Mondays, Wednesdays and Fridays and to take 500 mg for the rest of the week She will continue to take aspirin 81 mg daily Plan to see her back in 6 months  Parkinson disease ([place]) She is currently on treatment and follows with neurologist closely. Overall, she felt that her tremors have improved. I would defer to them for further management   No orders of the defined types were placed in this encounter.   INTERVAL HISTORY: Please see below for problem oriented charting. She returns for further follow-up She is compliant taking her medications as directed She has no side effects of chest recent infection, fever or chills No recent bleeding  SUMMARY OF ONCOLOGIC HISTORY:  This patient was discovered to have myeloproliferative disorder after routine blood work revealed elevated hemoglobin and platelet. 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 The patient never had bone marrow aspirate and biopsy. She was started on hydroxyurea and dose was adjusted due to hair thinning and fatigue On 09/30/2013, hydroxyurea dose was increased to 1000 mg on Mondays, Wednesdays and Fridays and 2 take 500 mg the rest of the week. On 10/22/2013, the dose of hydroxyurea was increased to 1000 mg daily along with phlebotomy. On August 2015, the dose of hydroxyurea was modified to 1000 mg daily Mondays to Fridays and 500  mg on Saturdays and Sunday.  However, the patient was not following instruction correctly. On 07/20/2014, the dose of hydroxyurea was modifed to 500 mg daily Mondays to Fridays and 1000 mg on Saturdays and Sundays On 02/08/2015, the dose of hydroxyurea was modified to 500 milligrams daily Mondays to Saturdays and 1000 mg on Sundays. She was referred to neurologist in 2016 and was diagnosed with Parkinson's disease On 07/17/2016, the dose of hydroxyurea is adjusted.  She takes 500 mg daily except for 2 days a week on Wednesdays and Sundays she take 1000 mg Starting 11/14/2016, the dose of hydroxyurea is suggested.  She is instructed to take 1000 mg on Mondays, Wednesdays and Fridays and take 500 mg daily for the rest of the week   REVIEW OF SYSTEMS:   Constitutional: Denies fevers, chills or abnormal weight loss Eyes: Denies blurriness of vision Ears, nose, mouth, throat, and face: Denies mucositis or sore throat Respiratory: Denies cough, dyspnea or wheezes Cardiovascular: Denies palpitation, chest discomfort or lower extremity swelling Gastrointestinal:  Denies nausea, heartburn or change in bowel habits Skin: Denies abnormal skin rashes Lymphatics: Denies new lymphadenopathy or easy bruising Neurological:Denies numbness, tingling or new weaknesses Behavioral/Psych: Mood is stable, no new changes  All other systems were reviewed with the patient and are negative.  I have reviewed the past medical history, past surgical history, social history and family history with the patient and they are unchanged from previous note.  ALLERGIES:  has No Known Allergies.  MEDICATIONS:  Current Outpatient Medications  Medication Sig Dispense Refill  . aspirin 81 MG tablet Take 81 mg by mouth daily.    Marland Kitchen  carbidopa-levodopa (SINEMET IR) 25-100 MG tablet TAKE 1.5 TABLETS BY MOUTH THREE TIMES DAILY 405 tablet 1  . Cholecalciferol (VITAMIN D3) 2000 UNITS capsule Take by mouth.    . hydroxyurea (HYDREA) 500  MG capsule TAKE 2 CAPSULES BY MOUTH ON MONDAY, WEDNESDAYS, AND FRIDAYS AND 1 CAPSULE BY MOUTH EVERY DAY FOR OTHER DAYS OF THE WEEK 100 capsule 6  . lisinopril (PRINIVIL,ZESTRIL) 20 MG tablet Take 20 mg by mouth.    . sertraline (ZOLOFT) 50 MG tablet      No current facility-administered medications for this visit.     PHYSICAL EXAMINATION: ECOG PERFORMANCE STATUS: 0 - Asymptomatic  Vitals:   06/17/18 1319  BP: (!) 120/91  Pulse: 83  Resp: 18  Temp: (!) 97.3 F (36.3 C)  SpO2: 100%   Filed Weights   06/17/18 1319  Weight: 134 lb 12.8 oz (61.1 kg)    GENERAL:alert, no distress and comfortable NEURO: alert & oriented x 3 with fluent speech, no focal motor/sensory deficits. Noted minimal tremor  LABORATORY DATA:  I have reviewed the data as listed    Component Value Date/Time   NA 139 11/16/2013 1404   K 4.0 11/16/2013 1404   CL 102 07/30/2012 1419   CO2 28 11/16/2013 1404   GLUCOSE 89 11/16/2013 1404   GLUCOSE 102 (H) 07/30/2012 1419   BUN 30.0 (H) 11/16/2013 1404   CREATININE 0.9 11/16/2013 1404   CALCIUM 9.1 11/16/2013 1404   PROT 6.7 11/16/2013 1404   ALBUMIN 4.0 11/16/2013 1404   AST 15 11/16/2013 1404   ALT <6 11/16/2013 1404   ALKPHOS 49 11/16/2013 1404   BILITOT 1.45 (H) 11/16/2013 1404   GFRNONAA 58 (L) 11/22/2009 1855   GFRAA  11/22/2009 1855    >60        The eGFR has been calculated using the MDRD equation. This calculation has not been validated in all clinical situations. eGFR's persistently <60 mL/min signify possible Chronic Kidney Disease.    No results found for: SPEP, UPEP  Lab Results  Component Value Date   WBC 4.7 06/17/2018   NEUTROABS 2.9 06/17/2018   HGB 13.1 06/17/2018   HCT 42.1 06/17/2018   MCV 106.9 (H) 06/17/2018   PLT 401 (H) 06/17/2018      Chemistry      Component Value Date/Time   NA 139 11/16/2013 1404   K 4.0 11/16/2013 1404   CL 102 07/30/2012 1419   CO2 28 11/16/2013 1404   BUN 30.0 (H) 11/16/2013 1404    CREATININE 0.9 11/16/2013 1404      Component Value Date/Time   CALCIUM 9.1 11/16/2013 1404   ALKPHOS 49 11/16/2013 1404   AST 15 11/16/2013 1404   ALT <6 11/16/2013 1404   BILITOT 1.45 (H) 11/16/2013 1404      All questions were answered. The patient knows to call the clinic with any problems, questions or concerns. No barriers to learning was detected.  I spent 10 minutes counseling the patient face to face. The total time spent in the appointment was 15 minutes and more than 50% was on counseling and review of test results  Heath Lark, MD 06/17/2018 1:27 PM

## 2018-06-17 NOTE — Assessment & Plan Note (Signed)
She is currently on treatment and follows with neurologist closely. Overall, she felt that her tremors have improved. I would defer to them for further management

## 2018-07-11 ENCOUNTER — Telehealth: Payer: Medicare Other | Admitting: Neurology

## 2018-07-17 NOTE — Progress Notes (Signed)
Virtual Visit via Video Note The purpose of this virtual visit is to provide medical care while limiting exposure to the novel coronavirus.    Consent was obtained for video visit:  Yes.   Answered questions that patient had about telehealth interaction:  Yes.   I discussed the limitations, risks, security and privacy concerns of performing an evaluation and management service by telemedicine. I also discussed with the patient that there may be a patient responsible charge related to this service. The patient expressed understanding and agreed to proceed.  Pt location: Home Physician Location: home Name of referring provider:  [name], MD I connected with Megan Mans at patients initiation/request on 07/18/2018 at  2:30 PM EDT by video enabled telemedicine application and verified that I am speaking with the correct person using two identifiers. Pt MRN:  [id number] Pt DOB:  03-03-1946 Video Participants:  Megan Mans;  daughter   History of Present Illness:  Patient is seen today in follow-up for Parkinson's disease.  Her daughter supplements the history.  She is on carbidopa/levodopa 25/100, 1.5 tablets 3 times per day.  Pt denies falls.  Pt denies lightheadedness, near syncope.  No hallucinations.  Mood has been good.  The records that were made available to me were reviewed.  She has seen Dr. Simeon Craft such for polycythemia rubra vera.  No medication changes have been made.  On hydroxyurea and asa   Observations/Objective:   Vitals:   07/18/18 1200  BP: 125/75  Weight: 140 lb (63.5 kg)  Height: 5\' 2"  (1.575 m)   GEN:  The patient appears stated age and is in NAD.  Neurological examination:  Orientation: The patient is alert and oriented x3. Cranial nerves: There is good facial symmetry. There is no significant facial hypomimia.  The speech is fluent and clear. Soft palate rises symmetrically and there is no tongue deviation. Hearing is intact to conversational tone.  Motor: Strength is at least antigravity x 4.   Shoulder shrug is equal and symmetric.  There is no pronator drift.  Movement examination: Tone: unable Abnormal movements: not much tremor today but a little in the LUE Coordination:  There is no decremation with RAM's, with any form of RAMS, including alternating supination and pronation of the forearm, hand opening and closing, finger taps.  Couldn't see the legs well to assess Gait and Station: The patient arises easily off of her couch.  The patient's stride length is good.      Assessment and Plan:   ASSESSMENT/PLAN:  1.  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             -doing well on carbidopa/levodopa 25/100, 1.5 tablets tid.    She is pleased with her medication.  She does not wish to change the dose.    -We talked about online exercise programs that she can do while she is inside during the pandemic.  2.  Hyperreflexia                       -insurance denied MRI brain 3.  RBD             -doesn't want med right now.   4.  Sialorrhea             -This is commonly associated with PD.  We talked about treatments.  The patient is not a candidate  for oral anticholinergic therapy because of increased risk of confusion and falls.  We discussed myobloc and 1% atropine drops.  We discusssed that candy like lemon drops can help by stimulating mm of the oropharynx to induce swallowing.  She doesn't want any medication right now.  Follow Up Instructions:  5 months  -I discussed the assessment and treatment plan with the patient. The patient was provided an opportunity to ask questions and all were answered. The patient agreed with the plan and demonstrated an understanding of the instructions.   The patient was advised to call back or seek an in-person evaluation if the symptoms worsen or if the condition fails to improve as anticipated.    Total  Time spent in visit with the patient was:  15 min, of which more than 50% of the time was spent in counseling and/or coordinating care on safety.   Pt understands and agrees with the plan of care outlined.        Alonza Bogus, DO

## 2018-07-18 ENCOUNTER — Other Ambulatory Visit: Payer: Self-pay

## 2018-07-18 ENCOUNTER — Encounter: Payer: Self-pay | Admitting: Neurology

## 2018-07-18 ENCOUNTER — Telehealth (INDEPENDENT_AMBULATORY_CARE_PROVIDER_SITE_OTHER): Payer: Medicare Other | Admitting: Neurology

## 2018-07-18 DIAGNOSIS — G20 Parkinson's disease: Secondary | ICD-10-CM | POA: Diagnosis not present

## 2018-07-22 ENCOUNTER — Ambulatory Visit: Payer: Medicare Other | Admitting: Neurology

## 2018-09-21 ENCOUNTER — Other Ambulatory Visit: Payer: Self-pay | Admitting: Neurology

## 2018-09-22 NOTE — Telephone Encounter (Signed)
Requested Prescriptions   Pending Prescriptions Disp Refills  . carbidopa-levodopa (SINEMET IR) 25-100 MG tablet [Pharmacy Med Name: CARBIDOPA/LEVODOPA 25-100MG  TABS] 405 tablet 1    Sig: TAKE 1.5 TABLETS BY MOUTH THREE TIMES DAILY   Rx last filled:12/23/17 #405 1 refills  Pt last seen: 07/18/18  Follow up appt scheduled:12/23/18

## 2018-10-13 ENCOUNTER — Other Ambulatory Visit: Payer: Self-pay | Admitting: Family Medicine

## 2018-10-13 DIAGNOSIS — Z1231 Encounter for screening mammogram for malignant neoplasm of breast: Secondary | ICD-10-CM

## 2018-11-27 ENCOUNTER — Other Ambulatory Visit: Payer: Self-pay

## 2018-11-27 ENCOUNTER — Ambulatory Visit
Admission: RE | Admit: 2018-11-27 | Discharge: 2018-11-27 | Disposition: A | Discharge status: Home / Self Care | Payer: Medicare Other | Source: Ambulatory Visit | Attending: Family Medicine | Admitting: Family Medicine

## 2018-11-27 DIAGNOSIS — Z1231 Encounter for screening mammogram for malignant neoplasm of breast: Secondary | ICD-10-CM

## 2018-12-22 ENCOUNTER — Other Ambulatory Visit: Payer: Self-pay | Admitting: Hematology and Oncology

## 2018-12-22 DIAGNOSIS — D45 Polycythemia vera: Secondary | ICD-10-CM

## 2018-12-22 NOTE — Progress Notes (Signed)
Carolyn Figueroa was seen today in follow up for Parkinsons disease.  She is accompanied by her daughter who supplements the history.  Pt is currently on carbidopa/levodopa 25/100, 1.5 tablets 3 times per day.  Pt denies falls.  Pt denies lightheadedness, near syncope.  No hallucinations.  Mood has been good.  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 aspirin 81 MG tablet Take 81 mg by mouth daily.  . carbidopa-levodopa (SINEMET IR) 25-100 MG tablet TAKE 1.5 TABLETS BY MOUTH THREE TIMES DAILY  . Cholecalciferol (VITAMIN D3) 2000 UNITS capsule Take by mouth.  . hydroxyurea (HYDREA) 500 MG capsule TAKE 2 CAPSULES BY MOUTH ON MONDAY, WEDNESDAYS, AND FRIDAYS AND 1 CAPSULE BY MOUTH EVERY DAY FOR OTHER DAYS OF THE WEEK  . lisinopril (PRINIVIL,ZESTRIL) 20 MG tablet Take 20 mg by mouth.  . [DISCONTINUED] hydroxyurea (HYDREA) 500 MG capsule TAKE 2 CAPSULES BY MOUTH ON MONDAY, WEDNESDAYS, AND FRIDAYS AND 1 CAPSULE BY MOUTH EVERY DAY FOR OTHER DAYS OF THE WEEK  . [DISCONTINUED] sertraline (ZOLOFT) 50 MG tablet    No facility-administered encounter medications on file as of 12/23/2018.     PAST MEDICAL HISTORY:   Past Medical History:  Diagnosis Date  . Colon polyp 05/2003  . Depression with anxiety   . HTN (hypertension)   . Insomnia   . Need for prophylactic vaccination and inoculation against influenza 01/14/2013  . Polycythemia   . 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 Thrombocytosis ([place])   . Tremor 11/16/2013  . Varicose vein   . Vitamin D deficiency   . Vulvar candidiasis   . Weight loss 11/16/2013    PAST SURGICAL HISTORY:   Past Surgical History:  Procedure Laterality Date  . NO PAST SURGERIES      SOCIAL HISTORY:   Social History   Socioeconomic History  . Marital status: Married    Spouse name: Not on file  . Number of children: 2  . Years of education: Not on file  . Highest education level: Some college, no degree  Occupational History    Employer: UNEMPLOYED    Comment: [employer]  . Financial resource strain: Not on file  . Food insecurity    Worry: Not on file    Inability: Not on file  . Transportation needs    Medical: Not on file    Non-medical: Not on file  Tobacco Use  . Smoking status: Never Smoker  . Smokeless tobacco: Never Used  Substance and Sexual Activity  . Alcohol use: No  . Drug use: No  . Sexual activity: Never    Birth control/protection: None  Lifestyle  . Physical activity    Days per week: Not on file    Minutes per session: Not on file  . Stress: Not on file  Relationships  . Social Herbalist on phone: Not on file    Gets together: Not on file    Attends religious service: Not on file    Active member of club or organization: Not on file    Attends meetings of clubs or organizations: Not on file    Relationship status: Not on file  . Intimate partner violence  Fear of current or ex partner: Not on file    Emotionally abused: Not on file    Physically abused: Not on file    Forced sexual activity: Not on file  Other Topics Concern  . Not on file  Social History Narrative  . Not on file    FAMILY HISTORY:   Family Status  Relation Name Status  . Mother  Deceased       HTN  . Father  Deceased       mi, clotting disorder  . Sister  Alive  . Brother  Alive  . Annamarie Major  Deceased  . Brother  Alive  . Brother  Alive  . Child x2 Alive    ROS:  ROS  PHYSICAL EXAMINATION:    VITALS:   Vitals:   12/23/18 1527  BP: (!) 150/99  Pulse: 81  SpO2: 98%  Weight: 130 lb 12.8 oz (59.3 kg)  Height: 5\' 2"  (1.575 m)    GEN:  The patient appears stated age and is in NAD. HEENT:  Normocephalic,  atraumatic.  The mucous membranes are moist. The superficial temporal arteries are without ropiness or tenderness. CV:  RRR Lungs:  CTAB Neck/HEME:  There are no carotid bruits bilaterally.  Neurological examination:  Orientation: The patient is alert and oriented x3. Cranial nerves: There is good facial symmetry with mild facial hypomimia. The speech is fluent and clear. Soft palate rises symmetrically and there is no tongue deviation. Hearing is intact to conversational tone. Sensation: Sensation is intact to light touch throughout Motor: Strength is at least antigravity x4.  Movement examination: Tone: There is mild increased tremor in the RUE (hasn't taken medication since early morning and was seen at 3:45pm) Abnormal movements: there is bilateral UE and RLE rest tremor Coordination:  There is no decremation with RAM's, with any form of RAMS, including alternating supination and pronation of the forearm, hand opening and closing, finger taps, heel taps and toe taps. Gait and Station: The patient has no difficulty arising out of a deep-seated chair without the use of the hands. The patient's stride length is good.    Labs:  Last lab work was done on March 18, 2018.  Hemoglobin A1c was 5.9.  Sodium was 139, potassium 4.4, chloride 105, CO2 32, BUN 25, creatinine 0.89, glucose 109, AST 22, ALT less than 3.  White blood cells were low at 3.7, hemoglobin 13.3, hematocrit 41.1, platelets 382  Lab Results  Component Value Date   TSH 1.462 11/16/2013     ASSESSMENT/PLAN:  1. 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 -doing well on carbidopa/levodopa 25/100, 1.5 tablets tid.   She is pleased with her medication.  She does not wish to change the dose.  she is a bit underdosed today but missed the middle of the day pill and was seen late afternoon.             -We talked about online  exercise programs that she can do while she is inside during the pandemic.  -I do not think that the patient's thyroid has been checked, but I can tell, since 2015.  I will go ahead and check that today.  2. 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 4. Sialorrhea -This is commonly associated with PD. We talked about treatments. The patient is not a candidate for oral anticholinergic therapy because of increased risk  of confusion and falls. We discussed myobloc and 1% atropine drops. We discusssed that candy like lemon drops can help by stimulating mm of the oropharynx to induce swallowing. She doesn't want any medication right now.  Cc:  [name], MD

## 2018-12-23 ENCOUNTER — Ambulatory Visit (INDEPENDENT_AMBULATORY_CARE_PROVIDER_SITE_OTHER): Payer: Medicare Other | Admitting: Neurology

## 2018-12-23 ENCOUNTER — Inpatient Hospital Stay: Payer: Medicare Other

## 2018-12-23 ENCOUNTER — Inpatient Hospital Stay: Payer: Medicare Other | Attending: Hematology and Oncology | Admitting: Hematology and Oncology

## 2018-12-23 ENCOUNTER — Other Ambulatory Visit: Payer: Self-pay

## 2018-12-23 ENCOUNTER — Encounter: Payer: Self-pay | Admitting: Hematology and Oncology

## 2018-12-23 ENCOUNTER — Other Ambulatory Visit (INDEPENDENT_AMBULATORY_CARE_PROVIDER_SITE_OTHER): Payer: Medicare Other

## 2018-12-23 ENCOUNTER — Encounter: Payer: Self-pay | Admitting: Neurology

## 2018-12-23 VITALS — BP 128/77 | HR 91 | Temp 98.2°F | Resp 18 | Ht 62.0 in | Wt 129.9 lb

## 2018-12-23 VITALS — BP 150/99 | HR 81 | Ht 62.0 in | Wt 130.8 lb

## 2018-12-23 DIAGNOSIS — R251 Tremor, unspecified: Secondary | ICD-10-CM

## 2018-12-23 DIAGNOSIS — D45 Polycythemia vera: Secondary | ICD-10-CM | POA: Diagnosis present

## 2018-12-23 DIAGNOSIS — G20 Parkinson's disease: Secondary | ICD-10-CM

## 2018-12-23 DIAGNOSIS — Z299 Encounter for prophylactic measures, unspecified: Secondary | ICD-10-CM | POA: Diagnosis not present

## 2018-12-23 DIAGNOSIS — Z7982 Long term (current) use of aspirin: Secondary | ICD-10-CM | POA: Diagnosis not present

## 2018-12-23 DIAGNOSIS — Z23 Encounter for immunization: Secondary | ICD-10-CM

## 2018-12-23 LAB — CBC WITH DIFFERENTIAL/PLATELET
Abs Immature Granulocytes: 0.02 10*3/uL (ref 0.00–0.07)
Basophils Absolute: 0.1 10*3/uL (ref 0.0–0.1)
Basophils Relative: 1 %
Eosinophils Absolute: 0.3 10*3/uL (ref 0.0–0.5)
Eosinophils Relative: 5 %
HCT: 45.9 % (ref 36.0–46.0)
Hemoglobin: 14.4 g/dL (ref 12.0–15.0)
Immature Granulocytes: 0 %
Lymphocytes Relative: 25 %
Lymphs Abs: 1.2 10*3/uL (ref 0.7–4.0)
MCH: 31.5 pg (ref 26.0–34.0)
MCHC: 31.4 g/dL (ref 30.0–36.0)
MCV: 100.4 fL — ABNORMAL HIGH (ref 80.0–100.0)
Monocytes Absolute: 0.3 10*3/uL (ref 0.1–1.0)
Monocytes Relative: 6 %
Neutro Abs: 3 10*3/uL (ref 1.7–7.7)
Neutrophils Relative %: 63 %
Platelets: 401 10*3/uL — ABNORMAL HIGH (ref 150–400)
RBC: 4.57 MIL/uL (ref 3.87–5.11)
RDW: 14.6 % (ref 11.5–15.5)
WBC: 4.8 10*3/uL (ref 4.0–10.5)
nRBC: 0 % (ref 0.0–0.2)

## 2018-12-23 MED ORDER — INFLUENZA VAC A&B SA ADJ QUAD 0.5 ML IM PRSY
PREFILLED_SYRINGE | INTRAMUSCULAR | Status: AC
  Filled 2018-12-23: qty 0.5

## 2018-12-23 MED ORDER — HYDROXYUREA 500 MG PO CAPS: ORAL_CAPSULE | ORAL | 6 refills | Status: DC

## 2018-12-23 MED ORDER — INFLUENZA VAC A&B SA ADJ QUAD 0.5 ML IM PRSY
0.5000 mL | PREFILLED_SYRINGE | Freq: Once | INTRAMUSCULAR | Status: AC
  Administered 2018-12-23: 12:00:00 0.5 mL via INTRAMUSCULAR

## 2018-12-23 NOTE — Assessment & Plan Note (Signed)
The patient has been taking her medications correctly CBC today showed minimal thrombocytosis. We will continue the same dose of hydroxyurea, to be taken at 1000 mg on Mondays, Wednesdays and Fridays and to take 500 mg for the rest of the week She will continue to take aspirin 81 mg daily Plan to see her back in 6 months

## 2018-12-23 NOTE — Assessment & Plan Note (Signed)
We discussed the importance of preventive care and reviewed the vaccination programs. She does not have any prior allergic reactions to influenza vaccination. She agrees to proceed with influenza vaccination today and we will administer it today at the clinic.  

## 2018-12-23 NOTE — Patient Instructions (Addendum)
1.  i'm not changing your medication today.  Let me know when you need a refill.  I'm going to check your thyroid as it has not been checked since 2015.    The physicians and staff at [facility] are committed to providing excellent care. You may receive a survey requesting feedback about your experience at our office. We strive to receive "very good" responses to the survey questions. If you feel that your experience would prevent you from giving the office a "very good " response, please contact our office to try to remedy the situation. We may be reached at [phone]. Thank you for taking the time out of your busy day to complete the survey.   Your provider has requested that you have labwork completed today. Please go to [facility] ([id number]) on the second floor of this building before leaving the office today. You do not need to check in. If you are not called within 15 minutes please check with the front desk.

## 2018-12-23 NOTE — Assessment & Plan Note (Signed)
Her tremor is less on exam today She has appointment to follow-up with neurologist

## 2018-12-23 NOTE — Progress Notes (Signed)
[facility] OFFICE PROGRESS NOTE  Patient Care Team: [name], MD as PCP - General (Family Medicine) Christy Sartorius, MD as Referring Physician (Urology) Avel Sensor, MD as Attending Physician (Obstetrics and Gynecology) Heath Lark, MD as Consulting Physician (Hematology and Oncology) Tat, Eustace Quail, DO as Consulting Physician (Neurology)  ASSESSMENT & PLAN:  Polycythemia vera The patient has been taking her medications correctly CBC today showed minimal thrombocytosis. We will continue the same dose of hydroxyurea, to be taken at 1000 mg on Mondays, Wednesdays and Fridays and to take 500 mg for the rest of the week She will continue to take aspirin 81 mg daily Plan to see her back in 6 months  Parkinson disease ([place]) Her tremor is less on exam today She has appointment to follow-up with neurologist  Preventive measure We discussed the importance of preventive care and reviewed the vaccination programs. She does not have any prior allergic reactions to influenza vaccination. She agrees to proceed with influenza vaccination today and we will administer it today at the clinic.    No orders of the defined types were placed in this encounter.   INTERVAL HISTORY: Please see below for problem oriented charting. She returns for further follow-up She is doing very well No recent infection, fever or chills She is taking her medication correctly She denies side effects  SUMMARY OF ONCOLOGIC HISTORY: Oncology History   No history exists.    REVIEW OF SYSTEMS:   Constitutional: Denies fevers, chills or abnormal weight loss Eyes: Denies blurriness of vision Ears, nose, mouth, throat, and face: Denies mucositis or sore throat Respiratory: Denies cough, dyspnea or wheezes Cardiovascular: Denies palpitation, chest discomfort or lower extremity swelling Gastrointestinal:  Denies nausea, heartburn or change in bowel habits Skin: Denies abnormal skin  rashes Lymphatics: Denies new lymphadenopathy or easy bruising Neurological:Denies numbness, tingling or new weaknesses Behavioral/Psych: Mood is stable, no new changes  All other systems were reviewed with the patient and are negative.  I have reviewed the past medical history, past surgical history, social history and family history with the patient and they are unchanged from previous note.  ALLERGIES:  has No Known Allergies.  MEDICATIONS:  Current Outpatient Medications  Medication Sig Dispense Refill  . aspirin 81 MG tablet Take 81 mg by mouth daily.    . carbidopa-levodopa (SINEMET IR) 25-100 MG tablet TAKE 1.5 TABLETS BY MOUTH THREE TIMES DAILY 405 tablet 1  . Cholecalciferol (VITAMIN D3) 2000 UNITS capsule Take by mouth.    . hydroxyurea (HYDREA) 500 MG capsule TAKE 2 CAPSULES BY MOUTH ON MONDAY, WEDNESDAYS, AND FRIDAYS AND 1 CAPSULE BY MOUTH EVERY DAY FOR OTHER DAYS OF THE WEEK 100 capsule 6  . lisinopril (PRINIVIL,ZESTRIL) 20 MG tablet Take 20 mg by mouth.     No current facility-administered medications for this visit.     PHYSICAL EXAMINATION: ECOG PERFORMANCE STATUS: 0 - Asymptomatic  Vitals:   12/23/18 1215  BP: 128/77  Pulse: 91  Resp: 18  Temp: 98.2 F (36.8 C)  SpO2: 100%   Filed Weights   12/23/18 1215  Weight: 129 lb 14.4 oz (58.9 kg)    GENERAL:alert, no distress and comfortable SKIN: skin color, texture, turgor are normal, no rashes or significant lesions EYES: normal, Conjunctiva are pink and non-injected, sclera clear OROPHARYNX:no exudate, no erythema and lips, buccal mucosa, and tongue normal  NECK: supple, thyroid normal size, non-tender, without nodularity LYMPH:  no palpable lymphadenopathy in the cervical, axillary or inguinal  LUNGS: clear to auscultation and percussion with normal breathing effort HEART: regular rate & rhythm and no murmurs and no lower extremity edema ABDOMEN:abdomen soft, non-tender and normal bowel  sounds Musculoskeletal:no cyanosis of digits and no clubbing  NEURO: alert & oriented x 3 with fluent speech, no focal motor/sensory deficits  LABORATORY DATA:  I have reviewed the data as listed    Component Value Date/Time   NA 139 11/16/2013 1404   K 4.0 11/16/2013 1404   CL 102 07/30/2012 1419   CO2 28 11/16/2013 1404   GLUCOSE 89 11/16/2013 1404   GLUCOSE 102 (H) 07/30/2012 1419   BUN 30.0 (H) 11/16/2013 1404   CREATININE 0.9 11/16/2013 1404   CALCIUM 9.1 11/16/2013 1404   PROT 6.7 11/16/2013 1404   ALBUMIN 4.0 11/16/2013 1404   AST 15 11/16/2013 1404   ALT <6 11/16/2013 1404   ALKPHOS 49 11/16/2013 1404   BILITOT 1.45 (H) 11/16/2013 1404   GFRNONAA 58 (L) 11/22/2009 1855   GFRAA  11/22/2009 1855    >60        The eGFR has been calculated using the MDRD equation. This calculation has not been validated in all clinical situations. eGFR's persistently <60 mL/min signify possible Chronic Kidney Disease.    No results found for: SPEP, UPEP  Lab Results  Component Value Date   WBC 4.8 12/23/2018   NEUTROABS 3.0 12/23/2018   HGB 14.4 12/23/2018   HCT 45.9 12/23/2018   MCV 100.4 (H) 12/23/2018   PLT 401 (H) 12/23/2018      Chemistry      Component Value Date/Time   NA 139 11/16/2013 1404   K 4.0 11/16/2013 1404   CL 102 07/30/2012 1419   CO2 28 11/16/2013 1404   BUN 30.0 (H) 11/16/2013 1404   CREATININE 0.9 11/16/2013 1404      Component Value Date/Time   CALCIUM 9.1 11/16/2013 1404   ALKPHOS 49 11/16/2013 1404   AST 15 11/16/2013 1404   ALT <6 11/16/2013 1404   BILITOT 1.45 (H) 11/16/2013 1404       RADIOGRAPHIC STUDIES: I have personally reviewed the radiological images as listed and agreed with the findings in the report. Mm 3d Screen Breast Bilateral  Result Date: 11/28/2018 CLINICAL DATA:  Screening. EXAM: DIGITAL SCREENING BILATERAL MAMMOGRAM WITH TOMO AND CAD COMPARISON:  Previous exam(s). ACR Breast Density Category b: There are scattered  areas of fibroglandular density. FINDINGS: There are no findings suspicious for malignancy. Images were processed with CAD. IMPRESSION: No mammographic evidence of malignancy. A result letter of this screening mammogram will be mailed directly to the patient. RECOMMENDATION: Screening mammogram in one year. (Code:SM-B-01Y) BI-RADS CATEGORY  1: Negative. Electronically Signed   By: Zerita Boers M.D.   On: 11/28/2018 15:20    All questions were answered. The patient knows to call the clinic with any problems, questions or concerns. No barriers to learning was detected.  I spent 10 minutes counseling the patient face to face. The total time spent in the appointment was 15 minutes and more than 50% was on counseling and review of test results  Heath Lark, MD 12/23/2018 12:28 PM

## 2018-12-24 ENCOUNTER — Telehealth: Payer: Self-pay | Admitting: Hematology and Oncology

## 2018-12-24 ENCOUNTER — Telehealth: Payer: Self-pay

## 2018-12-24 LAB — TSH: TSH: 2.86 u[IU]/mL (ref 0.35–4.50)

## 2018-12-24 NOTE — Telephone Encounter (Signed)
-----   Message from Bernice, DO sent at 12/24/2018  1:02 PM EDT ----- Let pt know that her TSH looked good

## 2018-12-24 NOTE — Telephone Encounter (Signed)
I talk with patient regarding schedule  

## 2018-12-24 NOTE — Telephone Encounter (Signed)
Spoke with daughter she was made aware if results.

## 2019-03-12 DIAGNOSIS — R7303 Prediabetes: Principal | ICD-10-CM

## 2019-03-12 DIAGNOSIS — E559 Vitamin D deficiency, unspecified: Principal | ICD-10-CM

## 2019-03-12 DIAGNOSIS — R630 Anorexia: Principal | ICD-10-CM

## 2019-03-13 ENCOUNTER — Ambulatory Visit: Admit: 2019-03-13 | Discharge: 2019-03-14 | Payer: MEDICARE

## 2019-03-24 ENCOUNTER — Other Ambulatory Visit: Payer: Self-pay

## 2019-03-24 ENCOUNTER — Other Ambulatory Visit: Payer: Self-pay | Admitting: Neurology

## 2019-03-24 MED ORDER — CARBIDOPA-LEVODOPA 25-100 MG PO TABS: ORAL_TABLET | ORAL | 2 refills | Status: DC

## 2019-03-24 NOTE — Telephone Encounter (Signed)
Carbidopa levodopa rx sent to pharmacy.  Patient daughter informed.

## 2019-03-24 NOTE — Telephone Encounter (Signed)
Patient's daughter called and said her mom is having a hard time getting refills on her carbidopa-levodopa 25-100 MG tablets. She said she has reached out to the pharmacy several times to request refills but been told they do not have any refills left on file. Patient is almost out of the medicine.  [facility] in [place] on [address]

## 2019-03-30 ENCOUNTER — Telehealth: Payer: Self-pay | Admitting: Neurology

## 2019-03-30 NOTE — Progress Notes (Signed)
Virtual Visit via Video Note The purpose of this virtual visit is to provide medical care while limiting exposure to the novel coronavirus.    Consent was obtained for video visit:  Yes.   Answered questions that patient had about telehealth interaction:  Yes.   I discussed the limitations, risks, security and privacy concerns of performing an evaluation and management service by telemedicine. I also discussed with the patient that there may be a patient responsible charge related to this service. The patient expressed understanding and agreed to proceed.  Pt location: Home Physician Location: office Name of referring provider:  [name], MD I connected with Carolyn Figueroa at patients initiation/request on 04/01/2019 at  9:15 AM EST by video enabled telemedicine application and verified that I am speaking with the correct person using two identifiers. Pt MRN:  [id number] Pt DOB:  [birth year] Video Participants:  Donyelle Jacelyn Grip, son who supplements the history , Rodena Piety on the phone as well (son on video and daughter on phone)   History of Present Illness:  Patient seen today in follow-up for Parkinson's disease.  She is accompanied by her son who supplements the history.  My previous records were reviewed prior to todays visit.  Patient's daughter called me a few days ago stating that the patient was not doing well and needed a work in visit.  Patient had Covid in November 12 (not eating, drinking and depressed) and has been slowly recovering.  The Covid caused increased fatigue and resulted in more shaking.  The shaking caused more trouble with her sleeping and waking her up.  Outside records that were made available to me were reviewed.  She saw her primary care physician on March 11, 2019 through telemedicine.  Primary care noted that the patient's sertraline had gotten dropped when she had Covid, and was just resolved mid December.  She did discuss potentially  changing that to mirtazapine, which could also help appetite.  She ordered multiple labs, which I reviewed and those were essentially unremarkable.  Currently, the patient is tremoring in both hands before meds are taken but that is worse than in the past, waking her up at night.  She is not exercising like she was because it is cold outside.  Her feet and fingers are curling.  This is not painful but it can throw off balance.  Son and daughter note that patient is having urinary frequency as well as diarrhea.  This is new.  Current movement d/o meds:  Carbidopa/levodopa 25/100, 1.5 tablets 3 times per day   Current Outpatient Medications on File Prior to Visit  Medication Sig Dispense Refill  . aspirin 81 MG tablet Take 81 mg by mouth daily.    . Cholecalciferol (VITAMIN D3) 2000 UNITS capsule Take by mouth.    . hydroxyurea (HYDREA) 500 MG capsule TAKE 2 CAPSULES BY MOUTH ON MONDAY, WEDNESDAYS, AND FRIDAYS AND 1 CAPSULE BY MOUTH EVERY DAY FOR OTHER DAYS OF THE WEEK 100 capsule 6  . lisinopril (PRINIVIL,ZESTRIL) 20 MG tablet Take 20 mg by mouth.    . sertraline (ZOLOFT) 50 MG tablet Take 0.5 tablets by mouth 2 (two) times daily.     No current facility-administered medications on file prior to visit.     Observations/Objective:   Vitals:   04/01/19 0902  BP: (!) 150/90  Weight: 125 lb (56.7 kg)  Height: 5\' 2"  (1.575 m)   GEN:  The patient appears stated age and is in NAD.  Neurological examination:  Orientation: The patient is alert and oriented x3. Cranial nerves: There is good facial symmetry. There is minimal facial hypomimia.  The speech is fluent and clear. Soft palate rises symmetrically and there is no tongue deviation. Hearing is intact to conversational tone. Motor: Strength is at least antigravity x 4.   Shoulder shrug is equal and symmetric.  There is no pronator drift.  Movement examination: Tone: unable Abnormal movements: There is mild rest tremor of the left  thumb Coordination:  There is no decremation with RAM's, with hand opening and closing, finger taps or the action of turning in a light bulb Gait and Station: The patient ambulates well in the home.  I have reviewed and interpreted the following labs independently  Lab work was done on March 13, 2019.  White blood cells were 6.6, hemoglobin 14.1, hematocrit 43.3 and platelets 376.  Sodium was 135, potassium 4.3, chloride 100, CO2 27, BUN 19, creatinine 0.92, glucose 103, AST 21, ALT less than 7, alkaline phosphatase 74, TSH 2.838.  hemoglobin A1c 5.8 Assessment and Plan:   1.  PD, dx 2016 with sx's since 2015  -Increase carbidopa/levodopa 25/100 from 1.5 tablets 3 times per day, to 2 tablets 3 times per day  -Add carbidopa/levodopa 50/200 at bedtime  -Discussed online exercise programs including online rock steady boxing and the online [employer] biking program sponsored by our movement disorder program.  Her daughter is going to contact Judson Roch to get the details. 2.  Hyperreflexia  -insurance denied MRI brain years ago 3.  RBD  -pt doesn't want additional med for that 4.  Sialorrhea  -This is commonly associated with PD.  We talked about treatments.  The patient is not a candidate for oral anticholinergic therapy because of increased risk of confusion and falls.  We discussed Botox (type A and B) and 1% atropine drops.  We discusssed that candy like lemon drops can help by stimulating mm of the oropharynx to induce swallowing.  Pt doesn't want med right now 5.  Depression  -Her sertraline just got restarted about 3 weeks ago.  I agree with her primary care physician that we need to give this time to work, and also agree that mirtazapine may be a good option in the future, which can help appetite, but it also can help sleep.  I use this a lot in my Parkinson's population with good success. 6.  Urinary frequency and diarrhea  -They are going to follow-up with the primary care physician in this regard  to at least get a urine sample.  Follow Up Instructions:  Follow-up at previously scheduled appointment.  -I discussed the assessment and treatment plan with the patient. The patient was provided an opportunity to ask questions and all were answered. The patient agreed with the plan and demonstrated an understanding of the instructions.   The patient was advised to call back or seek an in-person evaluation if the symptoms worsen or if the condition fails to improve as anticipated.    Total time spent on today's visit was 40  minutes, including both face-to-face time and nonface-to-face time.  Time included that spent on review of records (prior notes available to me/labs/imaging if pertinent), discussing treatment and goals, answering patient's questions and coordinating care.   Alonza Bogus, DO

## 2019-03-30 NOTE — Telephone Encounter (Signed)
She was worked in on wed

## 2019-03-30 NOTE — Telephone Encounter (Signed)
Was tested on Novemember 9, please advise

## 2019-03-30 NOTE — Telephone Encounter (Signed)
Please see .thanks.

## 2019-03-30 NOTE — Telephone Encounter (Addendum)
Patient's daughter called requesting an appointment as soon as possible with Dr. Carles Collet. She said her mom is still recovering from covid-19 and having a lot of problems. She reports increased fatigue, shaking that is causing her to wake up, not eating or drinking, increased bathroom trips, and she is not walking because of the shaking.  Patient is staying with the caller's brother right now and he can help with a virtual visit. Please call Rodena Piety to schedule.  Please advise on scheduling options?

## 2019-03-30 NOTE — Telephone Encounter (Signed)
When did she have covid? Any illness can increase symptoms of PD, but we usually don't change the PD meds for this because the symptoms usually get better as the disease resolves.

## 2019-04-01 ENCOUNTER — Telehealth (INDEPENDENT_AMBULATORY_CARE_PROVIDER_SITE_OTHER): Payer: Medicare Other | Admitting: Neurology

## 2019-04-01 ENCOUNTER — Other Ambulatory Visit: Payer: Self-pay

## 2019-04-01 ENCOUNTER — Encounter: Payer: Self-pay | Admitting: Neurology

## 2019-04-01 VITALS — BP 150/90 | Ht 62.0 in | Wt 125.0 lb

## 2019-04-01 DIAGNOSIS — K117 Disturbances of salivary secretion: Secondary | ICD-10-CM

## 2019-04-01 DIAGNOSIS — R292 Abnormal reflex: Secondary | ICD-10-CM

## 2019-04-01 DIAGNOSIS — Z7982 Long term (current) use of aspirin: Secondary | ICD-10-CM

## 2019-04-01 DIAGNOSIS — G20 Parkinson's disease: Secondary | ICD-10-CM | POA: Diagnosis not present

## 2019-04-01 DIAGNOSIS — R350 Frequency of micturition: Secondary | ICD-10-CM

## 2019-04-01 DIAGNOSIS — F330 Major depressive disorder, recurrent, mild: Secondary | ICD-10-CM

## 2019-04-01 DIAGNOSIS — G20A1 Parkinson's disease without dyskinesia, without mention of fluctuations: Secondary | ICD-10-CM

## 2019-04-01 DIAGNOSIS — R197 Diarrhea, unspecified: Secondary | ICD-10-CM

## 2019-04-01 DIAGNOSIS — F329 Major depressive disorder, single episode, unspecified: Secondary | ICD-10-CM | POA: Diagnosis not present

## 2019-04-01 DIAGNOSIS — Z8616 Personal history of COVID-19: Secondary | ICD-10-CM

## 2019-04-01 MED ORDER — CARBIDOPA-LEVODOPA ER 50-200 MG PO TBCR: 1.0000 | EXTENDED_RELEASE_TABLET | Freq: Every day | ORAL | 0 refills | Status: DC

## 2019-04-01 MED ORDER — CARBIDOPA-LEVODOPA 25-100 MG PO TABS: 2.0000 | ORAL_TABLET | Freq: Three times a day (TID) | ORAL | 1 refills | Status: DC

## 2019-04-23 ENCOUNTER — Ambulatory Visit: Payer: Medicare Other

## 2019-05-01 ENCOUNTER — Ambulatory Visit: Payer: Medicare Other

## 2019-05-04 ENCOUNTER — Telehealth: Payer: Self-pay | Admitting: Neurology

## 2019-05-04 NOTE — Telephone Encounter (Signed)
Daughter is calling in about the You tube links to the exercise classes for parkinsons- also needing a bike for exercise. She is wanting to get one since she is unable to go to the [facility] for exercising. Thanks!

## 2019-05-05 NOTE — Telephone Encounter (Signed)
I spoke with pt's daughter and provided cycling information

## 2019-05-14 ENCOUNTER — Ambulatory Visit: Payer: Medicare Other

## 2019-06-02 ENCOUNTER — Ambulatory Visit: Payer: Medicare Other | Admitting: Neurology

## 2019-06-17 ENCOUNTER — Other Ambulatory Visit: Payer: Self-pay

## 2019-06-17 MED ORDER — CARBIDOPA-LEVODOPA ER 50-200 MG PO TBCR: 1.0000 | EXTENDED_RELEASE_TABLET | Freq: Every day | ORAL | 2 refills | Status: DC

## 2019-06-17 NOTE — Telephone Encounter (Signed)
Rx(s) sent to pharmacy electronically.  

## 2019-06-19 ENCOUNTER — Telehealth: Payer: Self-pay | Admitting: Neurology

## 2019-06-19 MED ORDER — CARBIDOPA-LEVODOPA ER 50-200 MG PO TBCR: 1.0000 | EXTENDED_RELEASE_TABLET | Freq: Every day | ORAL | 2 refills | Status: DC

## 2019-06-19 NOTE — Telephone Encounter (Signed)
Rx resent to correct pharmacy.  Family aware.

## 2019-06-19 NOTE — Telephone Encounter (Signed)
Patient's daughter called and requested a new prescription for extended release carbidopa-levodopa CR 50-200. She said the patient is almost out of the medication.  Prescription sent to the wrong pharmacy, she needs it resent to:  [facility] [place]  *Please remove [facility] in [place], per pts. daughter

## 2019-06-22 ENCOUNTER — Other Ambulatory Visit: Payer: Medicare Other

## 2019-06-22 ENCOUNTER — Ambulatory Visit: Payer: Medicare Other | Admitting: Hematology and Oncology

## 2019-06-23 ENCOUNTER — Other Ambulatory Visit: Payer: Self-pay

## 2019-06-23 ENCOUNTER — Inpatient Hospital Stay: Payer: Medicare Other | Attending: Hematology and Oncology

## 2019-06-23 ENCOUNTER — Inpatient Hospital Stay (HOSPITAL_BASED_OUTPATIENT_CLINIC_OR_DEPARTMENT_OTHER): Payer: Medicare Other | Admitting: Hematology and Oncology

## 2019-06-23 ENCOUNTER — Encounter: Payer: Self-pay | Admitting: Hematology and Oncology

## 2019-06-23 ENCOUNTER — Telehealth: Payer: Self-pay | Admitting: Hematology and Oncology

## 2019-06-23 DIAGNOSIS — G20 Parkinson's disease: Secondary | ICD-10-CM | POA: Diagnosis not present

## 2019-06-23 DIAGNOSIS — D45 Polycythemia vera: Secondary | ICD-10-CM | POA: Diagnosis present

## 2019-06-23 DIAGNOSIS — Z7982 Long term (current) use of aspirin: Secondary | ICD-10-CM | POA: Diagnosis not present

## 2019-06-23 DIAGNOSIS — I10 Essential (primary) hypertension: Secondary | ICD-10-CM

## 2019-06-23 LAB — CBC WITH DIFFERENTIAL/PLATELET
Abs Immature Granulocytes: 0.01 10*3/uL (ref 0.00–0.07)
Basophils Absolute: 0 10*3/uL (ref 0.0–0.1)
Basophils Relative: 1 %
Eosinophils Absolute: 0.1 10*3/uL (ref 0.0–0.5)
Eosinophils Relative: 2 %
HCT: 37.9 % (ref 36.0–46.0)
Hemoglobin: 12.1 g/dL (ref 12.0–15.0)
Immature Granulocytes: 0 %
Lymphocytes Relative: 31 %
Lymphs Abs: 1.3 10*3/uL (ref 0.7–4.0)
MCH: 33.8 pg (ref 26.0–34.0)
MCHC: 31.9 g/dL (ref 30.0–36.0)
MCV: 105.9 fL — ABNORMAL HIGH (ref 80.0–100.0)
Monocytes Absolute: 0.2 10*3/uL (ref 0.1–1.0)
Monocytes Relative: 6 %
Neutro Abs: 2.5 10*3/uL (ref 1.7–7.7)
Neutrophils Relative %: 60 %
Platelets: 276 10*3/uL (ref 150–400)
RBC: 3.58 MIL/uL — ABNORMAL LOW (ref 3.87–5.11)
RDW: 16 % — ABNORMAL HIGH (ref 11.5–15.5)
WBC: 4.1 10*3/uL (ref 4.0–10.5)
nRBC: 0 % (ref 0.0–0.2)

## 2019-06-23 MED ORDER — HYDROXYUREA 500 MG PO CAPS: ORAL_CAPSULE | ORAL | 6 refills | Status: DC

## 2019-06-23 NOTE — Assessment & Plan Note (Signed)
she will continue current medical management. I recommend close follow-up with primary care doctor for medication adjustment. Her elevated blood pressure today could be related to anxiety/whitecoat hypertension.  She is not symptomatic.

## 2019-06-23 NOTE — Telephone Encounter (Signed)
Scheduled per 3/30 sch msg. Called pt and left a msg. Mailing printout

## 2019-06-23 NOTE — Progress Notes (Signed)
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 I recommend close follow-up with primary care doctor for medication adjustment. Her elevated blood pressure today could be related to anxiety/whitecoat hypertension.  She is not symptomatic.   No orders of the defined types were placed in this encounter.   All questions were answered. The patient knows to call the clinic with any problems, questions or concerns. The total time spent in the appointment was 15 minutes encounter with patients including review of chart and various tests results, discussions about plan of care and coordination of care plan   Heath Lark, MD 06/23/2019 12:43 PM  INTERVAL HISTORY: Please see below for problem oriented charting. 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 The  patient never had bone marrow aspirate and biopsy. She was started on hydroxyurea and dose was adjusted due to hair thinning and fatigue On 09/30/2013, hydroxyurea dose was increased to 1000 mg on Mondays, Wednesdays and Fridays and 2 take 500 mg the rest of the week. On 10/22/2013, the dose of hydroxyurea was increased to 1000 mg daily along with phlebotomy. On August 2015, the dose of hydroxyurea was modified to 1000 mg daily Mondays to Fridays and 500 mg on Saturdays and Sunday.  However, the patient was not following instruction correctly. On 07/20/2014, the dose of hydroxyurea was modifed to 500 mg daily Mondays to Fridays and 1000 mg on Saturdays and Sundays On 02/08/2015, the dose of hydroxyurea was modified to 500 milligrams daily Mondays to Saturdays and 1000 mg on Sundays. She was referred to neurologist in 2016 and was diagnosed with Parkinson's disease On 07/17/2016, the dose of hydroxyurea is adjusted.  She takes 500 mg daily except for 2 days a week on Wednesdays and Sundays she take 1000 mg Starting 11/14/2016, the dose of hydroxyurea is suggested.  She is instructed to take 1000 mg on Mondays, Wednesdays and Fridays and take 500 mg daily for the rest of the week  REVIEW OF SYSTEMS:   Constitutional: Denies fevers, chills or abnormal weight loss Eyes: Denies blurriness of vision Ears, nose, mouth, throat, and face: Denies mucositis or sore throat Respiratory: Denies cough, dyspnea or wheezes Cardiovascular: Denies palpitation, chest discomfort or lower extremity swelling Gastrointestinal:  Denies nausea, heartburn or change in bowel habits Skin: Denies abnormal skin rashes Lymphatics: Denies new lymphadenopathy or easy bruising Neurological:Denies numbness, tingling or new weaknesses Behavioral/Psych: Mood is stable, no new changes  All other  systems were reviewed with the patient and are negative.  I have reviewed the past medical history, past surgical history, social history and  family history with the patient and they are unchanged from previous note.  ALLERGIES:  has No Known Allergies.  MEDICATIONS:  Current Outpatient Medications  Medication Sig Dispense Refill  . aspirin 81 MG tablet Take 81 mg by mouth daily.    . carbidopa-levodopa (SINEMET CR) 50-200 MG tablet Take 1 tablet by mouth at bedtime. 90 tablet 2  . carbidopa-levodopa (SINEMET IR) 25-100 MG tablet Take 2 tablets by mouth 3 (three) times daily. 540 tablet 1  . Cholecalciferol (VITAMIN D3) 2000 UNITS capsule Take by mouth.    . hydroxyurea (HYDREA) 500 MG capsule TAKE 2 CAPSULES BY MOUTH ON MONDAY, WEDNESDAYS, AND FRIDAYS AND 1 CAPSULE BY MOUTH EVERY DAY FOR OTHER DAYS OF THE WEEK 100 capsule 6  . lisinopril (PRINIVIL,ZESTRIL) 20 MG tablet Take 20 mg by mouth.    . sertraline (ZOLOFT) 50 MG tablet Take 0.5 tablets by mouth 2 (two) times daily.     No current facility-administered medications for this visit.    PHYSICAL EXAMINATION: ECOG PERFORMANCE STATUS: 0 - Asymptomatic  Vitals:   06/23/19 1214  BP: (!) 159/98  Pulse: 79  Resp: 18  Temp: 97.6 F (36.4 C)  SpO2: 100%   Filed Weights   06/23/19 1214  Weight: 119 lb 12.8 oz (54.3 kg)    GENERAL:alert, no distress and comfortable NEURO: alert & oriented x 3 with fluent speech  LABORATORY DATA:  I have reviewed the data as listed    Component Value Date/Time   NA 139 11/16/2013 1404   K 4.0 11/16/2013 1404   CL 102 07/30/2012 1419   CO2 28 11/16/2013 1404   GLUCOSE 89 11/16/2013 1404   GLUCOSE 102 (H) 07/30/2012 1419   BUN 30.0 (H) 11/16/2013 1404   CREATININE 0.9 11/16/2013 1404   CALCIUM 9.1 11/16/2013 1404   PROT 6.7 11/16/2013 1404   ALBUMIN 4.0 11/16/2013 1404   AST 15 11/16/2013 1404   ALT <6 11/16/2013 1404   ALKPHOS 49 11/16/2013 1404   BILITOT 1.45 (H) 11/16/2013 1404   GFRNONAA 58 (L) 11/22/2009 1855   GFRAA  11/22/2009 1855    >60        The eGFR has been calculated using the MDRD equation. This  calculation has not been validated in all clinical situations. eGFR's persistently <60 mL/min signify possible Chronic Kidney Disease.    No results found for: SPEP, UPEP  Lab Results  Component Value Date   WBC 4.1 06/23/2019   NEUTROABS 2.5 06/23/2019   HGB 12.1 06/23/2019   HCT 37.9 06/23/2019   MCV 105.9 (H) 06/23/2019   PLT 276 06/23/2019      Chemistry      Component Value Date/Time   NA 139 11/16/2013 1404   K 4.0 11/16/2013 1404   CL 102 07/30/2012 1419   CO2 28 11/16/2013 1404   BUN 30.0 (H) 11/16/2013 1404   CREATININE 0.9 11/16/2013 1404      Component Value Date/Time   CALCIUM 9.1 11/16/2013 1404   ALKPHOS 49 11/16/2013 1404   AST 15 11/16/2013 1404   ALT <6 11/16/2013 1404   BILITOT 1.45 (H) 11/16/2013 1404

## 2019-06-23 NOTE — Assessment & Plan Note (Signed)
The patient has been taking her medications correctly CBC today showed normal CBC We will continue the same dose of hydroxyurea, to be taken at 1000 mg on Mondays, Wednesdays and Fridays and to take 500 mg for the rest of the week She will continue to take aspirin 81 mg daily Plan to see her back in 6 months

## 2019-06-25 ENCOUNTER — Telehealth: Payer: Self-pay

## 2019-06-25 NOTE — Telephone Encounter (Signed)
NOTES ON FILE FROM [facility] [phone], SENT REFERRAL TO SCHEDULING 

## 2019-07-20 NOTE — Progress Notes (Deleted)
Assessment/Plan:   1.  Parkinsons Disease, dx 2016  -***Continue carbidopa/levodopa, 2 tablets 3 times per day  -Continue carbidopa/levodopa 50/200 at bedtime 2.  Hyperreflexia             -insurance denied MRI brain years ago 3.  RBD             -Feels that she is doing well from this aspect and on no medications. 4.  Sialorrhea             -Not that bothersome, so wants no treatment right now. 5.  Depression             -On sertraline from primary care.   Subjective:   Carolyn Figueroa was seen today in follow up for Parkinsons disease.  My previous records were reviewed prior to todays visit as well as outside records available to me.  Medications were increased last visit.  I also asked them to go ahead and get a urine sample from primary care (she was living with her son in [place] at the time).  They report that ***. pt denies falls.  Pt denies lightheadedness, near syncope.  No hallucinations.  Mood has been good.  Current prescribed movement disorder medications: ***carbidopa/levodopa, 2 tablets 3 times per day carbidopa/levodopa 50/200 at bedtime   PREVIOUS MEDICATIONS: {Parkinson's RX:18200}  ALLERGIES:  No Known Allergies  CURRENT MEDICATIONS:  Outpatient Encounter Medications as of 07/21/2019  Medication Sig  . aspirin 81 MG tablet Take 81 mg by mouth daily.  . carbidopa-levodopa (SINEMET CR) 50-200 MG tablet Take 1 tablet by mouth at bedtime.  . carbidopa-levodopa (SINEMET IR) 25-100 MG tablet Take 2 tablets by mouth 3 (three) times daily.  . Cholecalciferol (VITAMIN D3) 2000 UNITS capsule Take by mouth.  . hydroxyurea (HYDREA) 500 MG capsule TAKE 2 CAPSULES BY MOUTH ON MONDAY, WEDNESDAYS, AND FRIDAYS AND 1 CAPSULE BY MOUTH EVERY DAY FOR OTHER DAYS OF THE WEEK  . lisinopril (PRINIVIL,ZESTRIL) 20 MG tablet Take 20 mg by mouth.  . sertraline (ZOLOFT) 50 MG tablet Take 0.5 tablets by mouth 2 (two) times daily.   No facility-administered encounter  medications on file as of 07/21/2019.    Objective:   PHYSICAL EXAMINATION:    VITALS:  There were no vitals filed for this visit.  GEN:  The patient appears stated age and is in NAD. HEENT:  Normocephalic, atraumatic.  The mucous membranes are moist. The superficial temporal arteries are without ropiness or tenderness. CV:  RRR Lungs:  CTAB Neck/HEME:  There are no carotid bruits bilaterally.  Neurological examination:  Orientation: The patient is alert and oriented x3. Cranial nerves: There is good facial symmetry with*** facial hypomimia. The speech is fluent and clear. Soft palate rises symmetrically and there is no tongue deviation. Hearing is intact to conversational tone. Sensation: Sensation is intact to light touch throughout Motor: Strength is at least antigravity x4.  Movement examination: Tone: There is ***tone in the *** Abnormal movements: *** Coordination:  There is *** decremation with RAM's, *** Gait and Station: The patient has *** difficulty arising out of a deep-seated chair without the use of the hands. The patient's stride length is ***.  The patient has a *** pull test.     I have reviewed and interpreted the following labs independently    Chemistry      Component Value Date/Time   NA 139 11/16/2013 1404   K 4.0 11/16/2013 1404   CL 102 07/30/2012  1419   CO2 28 11/16/2013 1404   BUN 30.0 (H) 11/16/2013 1404   CREATININE 0.9 11/16/2013 1404      Component Value Date/Time   CALCIUM 9.1 11/16/2013 1404   ALKPHOS 49 11/16/2013 1404   AST 15 11/16/2013 1404   ALT <6 11/16/2013 1404   BILITOT 1.45 (H) 11/16/2013 1404       Lab Results  Component Value Date   WBC 4.1 06/23/2019   HGB 12.1 06/23/2019   HCT 37.9 06/23/2019   MCV 105.9 (H) 06/23/2019   PLT 276 06/23/2019    Lab Results  Component Value Date   TSH 2.86 12/23/2018     Total time spent on today's visit was ***30 minutes, including both face-to-face time and nonface-to-face  time.  Time included that spent on review of records (prior notes available to me/labs/imaging if pertinent), discussing treatment and goals, answering patient's questions and coordinating care.  Cc:  [name], MD

## 2019-07-21 ENCOUNTER — Encounter: Payer: Self-pay | Admitting: Neurology

## 2019-07-21 ENCOUNTER — Ambulatory Visit: Payer: Medicare Other | Admitting: Neurology

## 2019-07-22 ENCOUNTER — Other Ambulatory Visit: Payer: Self-pay

## 2019-07-22 ENCOUNTER — Telehealth (INDEPENDENT_AMBULATORY_CARE_PROVIDER_SITE_OTHER): Payer: Medicare Other | Admitting: Neurology

## 2019-07-22 VITALS — Ht 61.0 in | Wt 115.0 lb

## 2019-07-22 DIAGNOSIS — G20 Parkinson's disease: Secondary | ICD-10-CM | POA: Diagnosis not present

## 2019-07-22 DIAGNOSIS — M4802 Spinal stenosis, cervical region: Secondary | ICD-10-CM | POA: Diagnosis not present

## 2019-07-22 NOTE — Progress Notes (Signed)
Virtual Visit Via Video   The purpose of this virtual visit is to provide medical care while limiting exposure to the novel coronavirus.    Consent was obtained for video visit:  Yes.   Answered questions that patient had about telehealth interaction:  Yes.   I discussed the limitations, risks, security and privacy concerns of performing an evaluation and management service by telemedicine. I also discussed with the patient that there may be a patient responsible charge related to this service. The patient expressed understanding and agreed to proceed.  Pt location: Home Physician Location: home Name of referring provider:  [name], MD I connected with Carolyn Figueroa at patients initiation/request on 07/22/2019 at  9:45 AM EDT by video enabled telemedicine application and verified that I am speaking with the correct person using two identifiers. Pt MRN:  [id number] Pt DOB:  [birth year] Video Participants:  Yaa Moyee Rubey;    Assessment/Plan:   1.  Parkinsons Disease, dx 2016  -redosed carbidopa/levodopa, 2 tablets 3 times per day (7am/11am/4pm).  Had accidentally dropped the 4pm.  Watch for increased dizziness  -Continue carbidopa/levodopa 50/200 at bedtime 2.  Hyperreflexia with significant cervical spinal stenosis  -was seen on mri brain.  Need dedicated MRI cervical spine.  Will order.   3.  RBD             -Feels that she is doing well from this aspect and on no medications. 4.  Sialorrhea             -Not that bothersome, so wants no treatment right now. 5.  Depression             -On sertraline from primary care. 6.  Dizziness  -pt/pcp tried to d/c lisinopril and didn't help  -pt now scheduled with cardiology for tilt table.   Subjective:   Carolyn Figueroa was seen today in follow up for Parkinsons disease.  My previous records were reviewed prior to todays visit as well as outside records available to me.  Medications were increased last visit.  I  also asked them to go ahead and get a urine sample from primary care (she was living with her son in [place] at the time).  They report that there was a UTI.  She was treated and referred to urology.  She was given kegel exercises.  She went to [facility].    pt had one fall while with her son in December.    Pt has had dizziness ever since she had covid. AM blood pressure running low but afternoon higher.  They tried to d/c the lisinopril and it didn't help.  Pt now has cardiology appt for tilt table test. No hallucinations.  Mood has been good.  Not exercising.  Pt had MRI of the brain since our last visit due to dizziness ordered by PCP (declined by insurance when I tried to order years ago).  I only have the report.  Reported to show atrophy and mild chronic microvascular ischemic change. No acute intracranial abnormality.  Mentioned that there was Cervical spondylosis causing significant spinal stenosis at C4-5 and C5-6. Consider cervical spine MRI for further evaluation.  This has not been completed.  Current prescribed movement disorder medications: carbidopa/levodopa, 2 tablets 3 times per day (daughter states that they misunderstood and only doing 2 po bid) carbidopa/levodopa 50/200 at bedtime   ALLERGIES:  No Known Allergies  CURRENT MEDICATIONS:  Outpatient Encounter Medications as of 07/22/2019  Medication Sig  . aspirin 81 MG tablet Take 81 mg by mouth daily.  . carbidopa-levodopa (SINEMET CR) 50-200 MG tablet Take 1 tablet by mouth at bedtime.  . carbidopa-levodopa (SINEMET IR) 25-100 MG tablet Take 2 tablets by mouth 3 (three) times daily.  . Cholecalciferol (VITAMIN D3) 2000 UNITS capsule Take 2,000 Units by mouth daily.   . hydroxyurea (HYDREA) 500 MG capsule TAKE 2 CAPSULES BY MOUTH ON MONDAY, WEDNESDAYS, AND FRIDAYS AND 1 CAPSULE BY MOUTH EVERY DAY FOR OTHER DAYS OF THE WEEK  . lisinopril (PRINIVIL,ZESTRIL) 20 MG tablet Take 20 mg by mouth daily.   . meclizine  (ANTIVERT) 12.5 MG tablet Take 12.5 mg by mouth 3 (three) times daily as needed for dizziness.  . Multiple Vitamin (MULTIVITAMIN WITH MINERALS) TABS tablet Take 1 tablet by mouth daily.  . sertraline (ZOLOFT) 50 MG tablet Take 0.5 tablets by mouth 2 (two) times daily.  Marland Kitchen tolterodine (DETROL LA) 2 MG 24 hr capsule Take 2 mg by mouth daily.   No facility-administered encounter medications on file as of 07/22/2019.    Objective:   PHYSICAL EXAMINATION:    VITALS:   Vitals:   07/21/19 1557  Weight: 115 lb (52.2 kg)  Height: 5\' 1"  (1.549 m)    GEN:  The patient appears stated age and is in NAD. HEENT:  Normocephalic, atraumatic.     Neurological examination:  Orientation: The patient is alert and oriented x3. Cranial nerves: There is good facial symmetry with facial hypomimia. The speech is fluent and clear. Soft palate rises symmetrically and there is no tongue deviation. Hearing is intact to conversational tone. Sensation: Sensation is intact to light touch throughout Motor: Strength is at least antigravity x4.  Movement examination: Tone: unable Abnormal movements: none Coordination:  There is mild decremation with RAM's, with finger taps, L more than right Gait and Station: The patient has no difficulty arising out of a deep-seated chair without the use of the hands. The patient's stride length is good.   I have reviewed and interpreted the following labs independently    Chemistry      Component Value Date/Time   NA 139 11/16/2013 1404   K 4.0 11/16/2013 1404   CL 102 07/30/2012 1419   CO2 28 11/16/2013 1404   BUN 30.0 (H) 11/16/2013 1404   CREATININE 0.9 11/16/2013 1404      Component Value Date/Time   CALCIUM 9.1 11/16/2013 1404   ALKPHOS 49 11/16/2013 1404   AST 15 11/16/2013 1404   ALT <6 11/16/2013 1404   BILITOT 1.45 (H) 11/16/2013 1404       Lab Results  Component Value Date   WBC 4.1 06/23/2019   HGB 12.1 06/23/2019   HCT 37.9 06/23/2019   MCV  105.9 (H) 06/23/2019   PLT 276 06/23/2019    Lab Results  Component Value Date   TSH 2.86 12/23/2018     Total time spent on today's visit was 30 minutes, including both face-to-face time and nonface-to-face time.  Time included that spent on review of records (prior notes available to me/labs/imaging if pertinent), discussing treatment and goals, answering patient's questions and coordinating care.  Cc:  [name], MD

## 2019-07-22 NOTE — Addendum Note (Signed)
Addended by: Ulice Brilliant T on: 07/22/2019 11:09 AM   Modules accepted: Orders

## 2019-07-22 NOTE — Addendum Note (Signed)
Addended by: Ulice Brilliant T on: 07/22/2019 10:37 AM   Modules accepted: Orders

## 2019-07-24 ENCOUNTER — Telehealth: Payer: Self-pay

## 2019-07-24 ENCOUNTER — Other Ambulatory Visit: Payer: Self-pay

## 2019-07-24 DIAGNOSIS — M4802 Spinal stenosis, cervical region: Secondary | ICD-10-CM

## 2019-07-24 NOTE — Telephone Encounter (Signed)
-----   Message from Sheffield Slider sent at 07/24/2019 11:17 AM EDT ----- Regarding: RE: change in locations          Order Request Summary  Order ID: [id number] Request Status:  [place]: [place]   Valid Dates: 07/24/2019 - 01/19/2020 Scheduled Date of Service: 07/24/2019   Member Information: Cipriano Bunker Member #: [id number] [address] [place] , [place] [id number] Date of Birth: [birth year] Phone: [phone] Ordering Provider: [facility] , [place] , [place] [id number] Phone: [phone] Fax: [phone] NPI: [id number] Servicing Provider:  Zortman Noonan , [place] [address] Phone: [phone] Fax: NPI: [id number] TIN: [id number]   ----- Message ----- From: Harold Hedge, CMA Sent: 07/24/2019   9:24 AM EDT To: Sheffield Slider Subject: change in locations                            Rex Kras,   Can in change the location for this patients MRI? I originally ordered the test at [facility] and I would like to change it to [place]. I will change the order in the chart.  Thanks,  [employer]

## 2019-08-17 ENCOUNTER — Ambulatory Visit
Admission: RE | Admit: 2019-08-17 | Discharge: 2019-08-17 | Disposition: A | Discharge status: Home / Self Care | Payer: Medicare Other | Source: Ambulatory Visit | Attending: Neurology | Admitting: Neurology

## 2019-08-17 DIAGNOSIS — M4802 Spinal stenosis, cervical region: Secondary | ICD-10-CM

## 2019-08-25 ENCOUNTER — Telehealth: Payer: Self-pay | Admitting: Interventional Cardiology

## 2019-08-25 NOTE — Telephone Encounter (Signed)
Spoke with daughter and made her aware ok to come to appt with pt.  Daughter appreciative for call.

## 2019-08-25 NOTE — Telephone Encounter (Signed)
Rodena Piety the patient's daughter is calling requesting she attend the patient's upcoming appointment scheduled for 08/26/19 due to her helping Carolyn Figueroa understand and translate when need be. Please advise.

## 2019-08-25 NOTE — Progress Notes (Signed)
Cardiology Office Note:    Date:  08/26/2019   ID:  Jamina, Floresgarcia 02-06-1946, MRN [id number]  PCP:  [name], MD  Cardiologist:  No primary care provider on file.   Referring MD: [name], MD   Chief Complaint  Patient presents with  . Dizziness    History of Present Illness:    Carolyn Figueroa is a 74 y.o. female with a hx of hypertension, Parkinson's Disease, polycythemia, and being seen for dizziness.  Dizziness is characterized as a lightheadedness but also a spinning sensation.  It can last minutes to an hour continuously, it can occur spontaneously, it can occur with movement and change in position, and there are no other symptoms such as palpitations, sweating, nausea, and diaphoresis.  It started after she had COVID-19 in November.  She has been vaccinated for COVID-19.  She denies headache.  Recently found to have cervical disc disease with compression.  She has some discomfort in the left arm.  She does not feel she is having vertigo and thinks she knows what vertigo feels like.  She denies palpitations, chest pain, history of syncope, edema, orthopnea, PND, family history of syncope.  Relatively recent history of Parkinson's disease with the patient taking Sinemet and some medication adjustments within the last year.  Past Medical History:  Diagnosis Date  . Colon polyp 05/2003  . Depression with anxiety   . HTN (hypertension)   . Insomnia   . Need for prophylactic vaccination and inoculation against influenza 01/14/2013  . Polycythemia   . 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 Thrombocytosis ([place])   . Tremor 11/16/2013  . Varicose vein   . Vitamin D deficiency   . Vulvar candidiasis   . Weight loss 11/16/2013    Past Surgical History:  Procedure Laterality Date  . NO PAST SURGERIES      Current Medications: Current Meds  Medication Sig  . aspirin 81 MG tablet Take 81 mg by mouth daily.  . carbidopa-levodopa (SINEMET  CR) 50-200 MG tablet Take 1 tablet by mouth at bedtime.  . carbidopa-levodopa (SINEMET IR) 25-100 MG tablet Take 2 tablets by mouth 3 (three) times daily.  . hydroxyurea (HYDREA) 500 MG capsule TAKE 2 CAPSULES BY MOUTH ON MONDAY, WEDNESDAYS, AND FRIDAYS AND 1 CAPSULE BY MOUTH EVERY DAY FOR OTHER DAYS OF THE WEEK  . lisinopril (PRINIVIL,ZESTRIL) 20 MG tablet Take 20 mg by mouth daily.   . meclizine (ANTIVERT) 12.5 MG tablet Take 12.5 mg by mouth 3 (three) times daily as needed for dizziness.  . Multiple Vitamin (MULTIVITAMIN WITH MINERALS) TABS tablet Take 1 tablet by mouth daily.  . sertraline (ZOLOFT) 50 MG tablet Take 0.5 tablets by mouth 2 (two) times daily.  Marland Kitchen tolterodine (DETROL LA) 2 MG 24 hr capsule Take 2 mg by mouth daily.     Allergies:   Patient has no known allergies.   Social History   Socioeconomic History  . Marital status: Married    Spouse name: Not on file  . Number of children: 2  . Years of education: Not on file  . Highest education level: Some college, no degree  Occupational History    Employer: UNEMPLOYED    Comment: Housewife  Tobacco Use  . Smoking status: Never Smoker  . Smokeless tobacco: Never Used  Substance and Sexual Activity  . Alcohol use: No  . Drug use: No  . Sexual activity: Never    Birth control/protection: None  Other Topics Concern  .  Not on file  Social History Narrative  . Not on file   Social Determinants of Health   Financial Resource Strain:   . Difficulty of Paying Living Expenses:   Food Insecurity:   . Worried About Charity fundraiser in the Last Year:   . Arboriculturist in the Last Year:   Transportation Needs:   . Film/video editor (Medical):   Marland Kitchen Lack of Transportation (Non-Medical):   Physical Activity:   . Days of Exercise per Week:   . Minutes of Exercise per Session:   Stress:   . Feeling of Stress :   Social Connections:   . Frequency of Communication with Friends and Family:   . Frequency of Social  Gatherings with Friends and Family:   . Attends Religious Services:   . Active Member of Clubs or Organizations:   . Attends Archivist Meetings:   Marland Kitchen Marital Status:      Family History: The patient's family history includes Cancer in her paternal uncle; Clotting disorder in her father; Healthy in her child; Heart attack in her father.  ROS:   Please see the history of present illness.    Some anxiety concerning Parkinson's.  History of neck pain.  Meclizine has at times been helpful with curbing the dizziness.  No history of heart disease or stroke.  All other systems reviewed and are negative.  EKGs/Labs/Other Studies Reviewed:    The following studies were reviewed today: No cardiac evaluation is available  Cervical MRI 08/17/2019: IMPRESSION: 1. Severe spinal canal stenosis and severe bilateral neural foraminal stenosis at C4-5. There is mass effect on the spinal cord at this level. 2. Moderate spinal canal stenosis and severe bilateral neural foraminal stenosis at C5-6. 3. Severe right C6-7 neural foraminal stenosis.  Brain MRI 06/24/2019: IMPRESSION: Atrophy and mild chronic microvascular ischemic change. No acute intracranial abnormality  Cervical spondylosis causing significant spinal stenosis at C4-5 and C5-6. Consider cervical spine MRI for further evaluation.  EKG:  EKG normal sinus rhythm, left atrial abnormality, overall normal-appearing EKG tracing.  Recent Labs: 12/23/2018: TSH 2.86 06/23/2019: Hemoglobin 12.1; Platelets 276  Recent Lipid Panel No results found for: CHOL, TRIG, HDL, CHOLHDL, VLDL, LDLCALC, LDLDIRECT  Physical Exam:    VS:  BP (!) 144/88   Pulse 85   Ht 5\' 1"  (1.549 m)   Wt 122 lb 1.9 oz (55.4 kg)   SpO2 99%   BMI 23.07 kg/m     Wt Readings from Last 3 Encounters:  08/26/19 122 lb 1.9 oz (55.4 kg)  07/21/19 115 lb (52.2 kg)  06/23/19 119 lb 12.8 oz (54.3 kg)    Orthostatic blood pressure: Standing 148/92 mmHg with heart  rate 92 bpm; sitting 132/85 mmHg, heart rate 92 bpm; lying 160/90 mmHg and heart rate 92 bpm.  GEN: Healthy-appearing and slightly overweight. No acute distress HEENT: Normal NECK: No JVD. LYMPHATICS: No lymphadenopathy CARDIAC:  RRR without murmur, gallop, or edema. VASCULAR:  Normal Pulses. No bruits. RESPIRATORY:  Clear to auscultation without rales, wheezing or rhonchi  ABDOMEN: Soft, non-tender, non-distended, No pulsatile mass, MUSCULOSKELETAL: No deformity  SKIN: Warm and dry NEUROLOGIC:  Alert and oriented x 3 PSYCHIATRIC:  Normal affect   ASSESSMENT:    1. Dizziness   2. Essential hypertension   3. Depression with anxiety   4. Polycythemia    PLAN:    In order of problems listed above:  1. Occurred intermittently during the exam for a very brief  moments, usually with motion and changing position.  No change in heart rhythm or blood pressure when complaints of dizziness.  I do not believe the dizziness is cardiac related.  Whether or not this feeling represents long  Covid symptomatology is difficult to know.  Recommend neurological evaluation and further consideration 2. Sitting blood pressure is stable.  Needs continued follow-up.  Target is 130/80 mmHg.  Continue to follow closely and consider adjustments to achieve blood pressures near target but avoiding excessive blood pressure control given Parkinson's. 3. Not discussed 4. Labs do not suggest that polycythemia is causing hyperviscosity syndrome. 5. She suffered COVID-19 in November and has been vaccinated and March.  Recommend further neurological evaluation; consideration of medication effect causing dizziness; also consider the possibility of A Long Covid Syndrome.  I do not believe that further cardiac evaluation will be useful.   Medication Adjustments/Labs and Tests Ordered: Current medicines are reviewed at length with the patient today.  Concerns regarding medicines are outlined above.  Orders Placed This  Encounter  Procedures  . EKG 12-Lead   No orders of the defined types were placed in this encounter.   Patient Instructions  Medication Instructions:  Your physician recommends that you continue on your current medications as directed. Please refer to the Current Medication list given to you today.  *If you need a refill on your cardiac medications before your next appointment, please call your pharmacy*   Lab Work: None If you have labs (blood work) drawn today and your tests are completely normal, you will receive your results only by: Marland Kitchen MyChart Message (if you have MyChart) OR . A paper copy in the mail If you have any lab test that is abnormal or we need to change your treatment, we will call you to review the results.   Testing/Procedures: None   Follow-Up: At [facility], you and your health needs are our priority.  As part of our continuing mission to provide you with exceptional heart care, we have created designated Provider Care Teams.  These Care Teams include your primary Cardiologist (physician) and Advanced Practice Providers (APPs -  Physician Assistants and Nurse Practitioners) who all work together to provide you with the care you need, when you need it.  We recommend signing up for the patient portal called "MyChart".  Sign up information is provided on this After Visit Summary.  MyChart is used to connect with patients for Virtual Visits (Telemedicine).  Patients are able to view lab/test results, encounter notes, upcoming appointments, etc.  Non-urgent messages can be sent to your provider as well.   To learn more about what you can do with MyChart, go to [email or web address].    Your next appointment:   As needed  The format for your next appointment:   In Person  Provider:   You may see Dr. Daneen Schick or one of the following Advanced Practice Providers on your designated Care Team:    Truitt Merle, NP  Cecilie Kicks, NP  Kathyrn Drown,  NP    Other Instructions      Signed, Sinclair Grooms, MD  08/26/2019 3:24 PM    [place]

## 2019-08-26 ENCOUNTER — Encounter: Payer: Self-pay | Admitting: Interventional Cardiology

## 2019-08-26 ENCOUNTER — Ambulatory Visit (INDEPENDENT_AMBULATORY_CARE_PROVIDER_SITE_OTHER): Payer: Medicare Other | Admitting: Interventional Cardiology

## 2019-08-26 ENCOUNTER — Other Ambulatory Visit: Payer: Self-pay

## 2019-08-26 VITALS — BP 144/88 | HR 85 | Ht 61.0 in | Wt 122.1 lb

## 2019-08-26 DIAGNOSIS — D751 Secondary polycythemia: Secondary | ICD-10-CM

## 2019-08-26 DIAGNOSIS — I10 Essential (primary) hypertension: Secondary | ICD-10-CM | POA: Diagnosis not present

## 2019-08-26 DIAGNOSIS — F418 Other specified anxiety disorders: Secondary | ICD-10-CM | POA: Diagnosis not present

## 2019-08-26 DIAGNOSIS — Z7189 Other specified counseling: Secondary | ICD-10-CM

## 2019-08-26 DIAGNOSIS — R42 Dizziness and giddiness: Secondary | ICD-10-CM

## 2019-08-26 NOTE — Patient Instructions (Signed)

## 2019-09-09 ENCOUNTER — Telehealth: Payer: Self-pay | Admitting: Neurology

## 2019-09-09 ENCOUNTER — Other Ambulatory Visit: Payer: Self-pay

## 2019-09-09 DIAGNOSIS — M4802 Spinal stenosis, cervical region: Secondary | ICD-10-CM

## 2019-09-09 NOTE — Telephone Encounter (Signed)
i'm not sure if PT will do PT on the neck without clearance from neurosurgery first given the findings on the MRI.  She could do PT for Parkinsons Disease but cervical spine PT would need neurosurgery clearance first

## 2019-09-09 NOTE — Telephone Encounter (Signed)
Pt daughter called and states that we did a referral to a neurosurgeon and the patient does not want to do that or go to see them so they would like to know if we could order PT for the patient or would they need to go to the Neurosurgeon and have them order the PT please call

## 2019-09-09 NOTE — Telephone Encounter (Signed)
Referral placed to [facility]. Faxed all info incl insurance card, notes, facesheet, MRI results to [phone].

## 2019-10-09 ENCOUNTER — Telehealth: Payer: Self-pay

## 2019-10-09 MED ORDER — CARBIDOPA-LEVODOPA ER 25-100 MG PO TBCR: EXTENDED_RELEASE_TABLET | ORAL | 1 refills | Status: DC

## 2019-10-09 NOTE — Telephone Encounter (Signed)
Spoke with patients daughter and explained Dr Doristine Devoid recommendations. Daughter voiced understanding, but stated her mom has been to two other specialist who say the dizziness is not from Covid-wanted me to make Dr Tat aware.    Patients daughter wanted to know how fast this new medication will work. Advised her to give the medication a week or two to see how the patient reacts to the medication. Daughter voiced understanding.   Rx(s) sent to pharmacy electronically.

## 2019-10-09 NOTE — Telephone Encounter (Signed)
Received voicemail from patients daughter Rodena Piety stating the patient is complaining about consistent dizziness. Daughter stated they have persued other avenues and seen other specialist. Daughter states the specialist have been unable to help them. They think the dizziness is from some of the medications that the patient is taking. They also think the patient has orthostatic hypotension. Daughter would like for Dr Tat to review records from cardiologist and pcp's Precious Haws. Daughter states dizziness is affecting her mom's quality of life. Daughter wants to know if Dr Tat would consider changing her moms medications or prescribing something like Floranef, hydrydopa or midodrine. Daughter also wants to know if this is something Dr Tat cold address or if the need to follow up with the patients pcp.

## 2019-10-09 NOTE — Telephone Encounter (Signed)
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  She will continue carbidopa/levodopa 50/200 cr at bedtime.  You can send into pharmacy

## 2019-12-01 ENCOUNTER — Other Ambulatory Visit: Payer: Self-pay | Admitting: Neurology

## 2019-12-02 NOTE — Telephone Encounter (Signed)
Rx(s) sent to pharmacy electronically.  

## 2019-12-07 ENCOUNTER — Other Ambulatory Visit: Payer: Self-pay | Admitting: Family Medicine

## 2019-12-07 DIAGNOSIS — Z1231 Encounter for screening mammogram for malignant neoplasm of breast: Secondary | ICD-10-CM

## 2019-12-17 NOTE — Progress Notes (Signed)
Assessment/Plan:   1.  Parkinsons Disease  -Continue carbidopa/levodopa 25/100, 2 tablets at 7 AM/11 AM/4 PM  -stop carbidopa/levodopa 50/200 at bedtime.  They will call me in 2 weeks and if not doing well, we will decrease the daytime dosage, although not completely convinced that dizzy is the result of Parkinsons Disease   2.  Cervical spinal stenosis, severe  -Has been referred to neurosurgery and saw Dr. Marcello Moores.  Surgery was strongly recommended by him, but declined by the patient.  She and I discussed this in detail and she doesn't want surgery.  She understands the risks.   Films reviewed with the patient today.  She is to follow-up with him.   3.  RBD  -Doing well from this aspect.  Does not need medication.  4.  Depression  -Under the management of primary care with sertraline  5.  Dizzy  -difficult to believe that this is Neurogenic Orthostatic Hypotension as BP very high .  Cardiology also didn't note OH in their office and felt that this was potentially covid long hauler.  As above, pt/family would like to alter carbidopa/levodopa 25/100 and we will see if that helps and if it does, it will give [place] an answer.  If not, we will need f/u with PCP.     Subjective:   Carolyn Figueroa was seen today in follow up for Parkinsons disease.  My previous records were reviewed prior to todays visit as well as outside records available to me. Pt denies falls.  Pt has followed up with cardiology since last visit for her dizziness.  They did not feel cardiac in nature.  Wondered if it was a symptom of long Covid symptomatology.  They called back here after that to see if we could change around her medications, which we did.  We discontinued the immediate release version of her medication and instead had her take carbidopa/levodopa 25/100 CR, 2 tablets at 7 AM/11 AM/4 PM.  No hallucinations.  Mood has been good.  Pt still dizzy and daughter would like to change the levodopa (decrease). Patient  had MRI of the cervical spine done since last visit because of hyperreflexia and neck pain.  There was evidence of severe spinal canal stenosis and severe bilateral neural foraminal stenosis at C4-C5 and severe right C6-C7 neural foraminal stenosis.  She was referred to neurosurgery.  I called and just got those records today.  Patient saw neurosurgery on July 9.  Surgery was strongly recommended given the degree of severe spinal stenosis.  It was felt that patient was going to get worse symptomatically.  Patient declined surgery, but was to follow-up with Dr. Marcello Moores.  Current prescribed movement disorder medications: Carbidopa/levodopa 25/100 CR, 2 tablets at 7 AM/11 AM/4 PM (prior the patient had accidentally dropped the 4 PM dose) Carbidopa/levodopa 50/200 at bedtime    PREVIOUS MEDICATIONS:  carbidopa/levodopa 25/100 IR (we stopped this to see if it would help the dizziness)  ALLERGIES:  No Known Allergies  CURRENT MEDICATIONS:  Outpatient Encounter Medications as of 12/18/2019  Medication Sig  . aspirin 81 MG tablet Take 81 mg by mouth daily.  . carbidopa-levodopa (SINEMET CR) 50-200 MG tablet Take 1 tablet by mouth at bedtime.  . Carbidopa-Levodopa ER (SINEMET CR) 25-100 MG tablet controlled release TAKE 2 TABLETS BY MOUTH AT 7 AM, 2 TABLETS AT 11AM AND 2 TABLETS AT 4PM  . hydroxyurea (HYDREA) 500 MG capsule TAKE 2 CAPSULES BY MOUTH ON MONDAY AND FRIDAYS AND 1  CAPSULE BY MOUTH the rest of the week  . meclizine (ANTIVERT) 12.5 MG tablet Take 12.5 mg by mouth 3 (three) times daily as needed for dizziness.  . Multiple Vitamin (MULTIVITAMIN WITH MINERALS) TABS tablet Take 1 tablet by mouth daily.  . sertraline (ZOLOFT) 50 MG tablet Take 0.5 tablets by mouth 2 (two) times daily.  Marland Kitchen tolterodine (DETROL LA) 2 MG 24 hr capsule Take 2 mg by mouth daily.  . [DISCONTINUED] hydroxyurea (HYDREA) 500 MG capsule TAKE 2 CAPSULES BY MOUTH ON MONDAY, WEDNESDAYS, AND FRIDAYS AND 1 CAPSULE BY MOUTH EVERY DAY  FOR OTHER DAYS OF THE WEEK  . [DISCONTINUED] lisinopril (PRINIVIL,ZESTRIL) 20 MG tablet Take 20 mg by mouth daily.    No facility-administered encounter medications on file as of 12/18/2019.    Objective:   PHYSICAL EXAMINATION:    VITALS:   Vitals:   12/18/19 1254  BP: (!) 163/102  Pulse: 88  SpO2: 99%  Weight: 122 lb (55.3 kg)  Height: 5\' 2"  (1.575 m)    GEN:  The patient appears stated age and is in NAD. HEENT:  Normocephalic, atraumatic.  The mucous membranes are moist. The superficial temporal arteries are without ropiness or tenderness. CV:  RRR Lungs:  CTAB Neck/HEME:  There are no carotid bruits bilaterally.  Neurological examination:  Orientation: The patient is alert and oriented x3. Cranial nerves: There is good facial symmetry with facial hypomimia. The speech is fluent and clear. Soft palate rises symmetrically and there is no tongue deviation. Hearing is intact to conversational tone. Sensation: Sensation is intact to light touch throughout Motor: Strength is at least antigravity x4.  Movement examination: Tone: There is normal tone in the UE but increased, mod, in the LE bilaterally Abnormal movements: none Coordination:  There is mild decremation with RAM's Gait and Station: The patient has mild difficulty arising out of a deep-seated chair without the use of the hands. The patient's stride length is slightly decreased with decreased arm swing on the L.    I have reviewed and interpreted the following labs independently    Chemistry      Component Value Date/Time   NA 139 11/16/2013 1404   K 4.0 11/16/2013 1404   CL 102 07/30/2012 1419   CO2 28 11/16/2013 1404   BUN 30.0 (H) 11/16/2013 1404   CREATININE 0.9 11/16/2013 1404      Component Value Date/Time   CALCIUM 9.1 11/16/2013 1404   ALKPHOS 49 11/16/2013 1404   AST 15 11/16/2013 1404   ALT <6 11/16/2013 1404   BILITOT 1.45 (H) 11/16/2013 1404       Lab Results  Component Value Date   WBC  4.1 12/18/2019   HGB 11.7 (L) 12/18/2019   HCT 36.2 12/18/2019   MCV 105.8 (H) 12/18/2019   PLT 304 12/18/2019    Lab Results  Component Value Date   TSH 2.86 12/23/2018     Total time spent on today's visit was 40 minutes, including both face-to-face time and nonface-to-face time.  Time included that spent on review of records (prior notes available to me/labs/imaging if pertinent), discussing treatment and goals, answering patient's questions and coordinating care.  Cc:  [name], MD

## 2019-12-18 ENCOUNTER — Ambulatory Visit: Payer: Medicare Other | Admitting: Neurology

## 2019-12-18 ENCOUNTER — Encounter: Payer: Self-pay | Admitting: Hematology and Oncology

## 2019-12-18 ENCOUNTER — Telehealth: Payer: Self-pay | Admitting: Hematology and Oncology

## 2019-12-18 ENCOUNTER — Encounter: Payer: Self-pay | Admitting: Neurology

## 2019-12-18 ENCOUNTER — Other Ambulatory Visit: Payer: Self-pay

## 2019-12-18 ENCOUNTER — Inpatient Hospital Stay (HOSPITAL_BASED_OUTPATIENT_CLINIC_OR_DEPARTMENT_OTHER): Payer: Medicare Other | Admitting: Hematology and Oncology

## 2019-12-18 ENCOUNTER — Inpatient Hospital Stay: Payer: Medicare Other | Attending: Hematology and Oncology

## 2019-12-18 ENCOUNTER — Ambulatory Visit (INDEPENDENT_AMBULATORY_CARE_PROVIDER_SITE_OTHER): Payer: Medicare Other | Admitting: Neurology

## 2019-12-18 VITALS — BP 158/90 | HR 84 | Temp 97.7°F | Resp 18 | Ht 61.0 in | Wt 123.0 lb

## 2019-12-18 VITALS — BP 163/102 | HR 88 | Ht 62.0 in | Wt 122.0 lb

## 2019-12-18 DIAGNOSIS — Z7982 Long term (current) use of aspirin: Secondary | ICD-10-CM | POA: Diagnosis not present

## 2019-12-18 DIAGNOSIS — I10 Essential (primary) hypertension: Secondary | ICD-10-CM | POA: Insufficient documentation

## 2019-12-18 DIAGNOSIS — G20 Parkinson's disease: Secondary | ICD-10-CM | POA: Diagnosis not present

## 2019-12-18 DIAGNOSIS — R42 Dizziness and giddiness: Secondary | ICD-10-CM

## 2019-12-18 DIAGNOSIS — Z23 Encounter for immunization: Secondary | ICD-10-CM

## 2019-12-18 DIAGNOSIS — Z79899 Other long term (current) drug therapy: Secondary | ICD-10-CM | POA: Diagnosis not present

## 2019-12-18 DIAGNOSIS — Z299 Encounter for prophylactic measures, unspecified: Secondary | ICD-10-CM

## 2019-12-18 DIAGNOSIS — D6481 Anemia due to antineoplastic chemotherapy: Secondary | ICD-10-CM | POA: Insufficient documentation

## 2019-12-18 DIAGNOSIS — D45 Polycythemia vera: Secondary | ICD-10-CM | POA: Diagnosis present

## 2019-12-18 DIAGNOSIS — T451X5A Adverse effect of antineoplastic and immunosuppressive drugs, initial encounter: Secondary | ICD-10-CM | POA: Insufficient documentation

## 2019-12-18 DIAGNOSIS — M4802 Spinal stenosis, cervical region: Secondary | ICD-10-CM

## 2019-12-18 DIAGNOSIS — G20A1 Parkinson's disease without dyskinesia, without mention of fluctuations: Secondary | ICD-10-CM

## 2019-12-18 LAB — CBC WITH DIFFERENTIAL/PLATELET
Abs Immature Granulocytes: 0.01 10*3/uL (ref 0.00–0.07)
Basophils Absolute: 0 10*3/uL (ref 0.0–0.1)
Basophils Relative: 1 %
Eosinophils Absolute: 0.1 10*3/uL (ref 0.0–0.5)
Eosinophils Relative: 2 %
HCT: 36.2 % (ref 36.0–46.0)
Hemoglobin: 11.7 g/dL — ABNORMAL LOW (ref 12.0–15.0)
Immature Granulocytes: 0 %
Lymphocytes Relative: 33 %
Lymphs Abs: 1.4 10*3/uL (ref 0.7–4.0)
MCH: 34.2 pg — ABNORMAL HIGH (ref 26.0–34.0)
MCHC: 32.3 g/dL (ref 30.0–36.0)
MCV: 105.8 fL — ABNORMAL HIGH (ref 80.0–100.0)
Monocytes Absolute: 0.3 10*3/uL (ref 0.1–1.0)
Monocytes Relative: 7 %
Neutro Abs: 2.3 10*3/uL (ref 1.7–7.7)
Neutrophils Relative %: 57 %
Platelets: 304 10*3/uL (ref 150–400)
RBC: 3.42 MIL/uL — ABNORMAL LOW (ref 3.87–5.11)
RDW: 13.9 % (ref 11.5–15.5)
WBC: 4.1 10*3/uL (ref 4.0–10.5)
nRBC: 0 % (ref 0.0–0.2)

## 2019-12-18 MED ORDER — INFLUENZA VAC A&B SA ADJ QUAD 0.5 ML IM PRSY
PREFILLED_SYRINGE | INTRAMUSCULAR | Status: AC
  Filled 2019-12-18: qty 0.5

## 2019-12-18 MED ORDER — INFLUENZA VAC A&B SA ADJ QUAD 0.5 ML IM PRSY
0.5000 mL | PREFILLED_SYRINGE | Freq: Once | INTRAMUSCULAR | Status: AC
  Administered 2019-12-18: 0.5 mL via INTRAMUSCULAR

## 2019-12-18 MED ORDER — HYDROXYUREA 500 MG PO CAPS: ORAL_CAPSULE | ORAL | 6 refills | Status: DC

## 2019-12-18 NOTE — Progress Notes (Signed)
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 correctly CBC today showed mild progressive anemia I recommend changes to be made to her medications She is instructed to take hydroxyurea 1000 mg on Mondays and Fridays and to take 500 mg for the rest of the week She will continue to take aspirin 81 mg daily Plan to see her back in 6 months  Anemia due to antineoplastic chemotherapy Over the course of the year, she is noted to have very mild progressive anemia As above, I plan to reduce the dose of hydroxyurea I do not believe the mild anemia is the cause of her dizziness  Essential hypertension Her blood pressure is mildly elevated I would defer to her primary care doctor for medical management  Parkinson disease [facility]) According to the daughter, she continues to have dizziness, could be either related to fluctuation of blood pressure versus her Parkinson's disease She has appointment to see neurologist for further follow-up Again, I do not believe the hydroxyurea or her mild anemia is the cause of her symptoms  Preventive measure We discussed the importance of preventive care and reviewed the vaccination programs. She does not have any prior allergic reactions to influenza vaccination. She agrees to proceed with influenza vaccination today and we will administer it today at the clinic.    No orders of the defined types were placed in this encounter.   All questions were answered. The patient knows to call the clinic with any problems, questions or concerns. The total time  spent in the appointment was 20 minutes encounter with patients including review of chart and various tests results, discussions about plan of care and coordination of care plan   Heath Lark, MD 12/18/2019 12:12 PM  INTERVAL HISTORY: Please see below for problem oriented charting. She returns with her daughter for further follow-up She is taking her medications correctly as instructed She has occasional dizziness Denies recent falls No recent infection, fever or chills The patient denies any recent signs or symptoms of bleeding such as spontaneous epistaxis, hematuria or hematochezia. She is up-to-date with all vaccination except influenza vaccination.  She also have a question regarding booster Covid 19 injection  SUMMARY OF ONCOLOGIC HISTORY:  This patient was discovered to have myeloproliferative disorder after routine blood work revealed elevated hemoglobin and platelet. 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 The patient never had bone marrow aspirate and biopsy. She was started on hydroxyurea and dose was adjusted due to hair thinning and fatigue On 09/30/2013, hydroxyurea dose was increased to 1000 mg on Mondays, Wednesdays and Fridays and 2 take 500 mg the rest of the week. On 10/22/2013, the dose of hydroxyurea was increased to 1000 mg daily along with phlebotomy. On August 2015, the dose of hydroxyurea was modified to 1000 mg daily Mondays to Fridays and 500 mg on Saturdays and Sunday.  However, the patient was not following instruction correctly. On 07/20/2014, the dose of hydroxyurea was modifed to 500 mg daily Mondays to Fridays and 1000 mg on Saturdays and Sundays On 02/08/2015, the dose of hydroxyurea was modified to 500 milligrams daily Mondays to Saturdays and 1000 mg  on Sundays. She was referred to neurologist in 2016 and was diagnosed with Parkinson's disease On 07/17/2016, the dose of hydroxyurea is adjusted.  She takes 500 mg daily except for 2 days a week on  Wednesdays and Sundays she take 1000 mg Starting 11/14/2016, the dose of hydroxyurea is suggested.  She is instructed to take 1000 mg on Mondays, Wednesdays and Fridays and take 500 mg daily for the rest of the week Starting 12/18/2019, the dose of hydroxyurea is changed.  She is instructed to take 1000 mg on Mondays and Fridays and take 500 mg daily for the rest of the week  REVIEW OF SYSTEMS:   Constitutional: Denies fevers, chills or abnormal weight loss Eyes: Denies blurriness of vision Ears, nose, mouth, throat, and face: Denies mucositis or sore throat Respiratory: Denies cough, dyspnea or wheezes Cardiovascular: Denies palpitation, chest discomfort or lower extremity swelling Gastrointestinal:  Denies nausea, heartburn or change in bowel habits Skin: Denies abnormal skin rashes Lymphatics: Denies new lymphadenopathy or easy bruising Neurological:Denies numbness, tingling or new weaknesses Behavioral/Psych: Mood is stable, no new changes  All other systems were reviewed with the patient and are negative.  I have reviewed the past medical history, past surgical history, social history and family history with the patient and they are unchanged from previous note.  ALLERGIES:  has No Known Allergies.  MEDICATIONS:  Current Outpatient Medications  Medication Sig Dispense Refill  . Carbidopa-Levodopa ER (SINEMET CR) 25-100 MG tablet controlled release TAKE 2 TABLETS BY MOUTH AT 7 AM, 2 TABLETS AT 11AM AND 2 TABLETS AT 4PM 180 tablet 0  . aspirin 81 MG tablet Take 81 mg by mouth daily.    . carbidopa-levodopa (SINEMET CR) 50-200 MG tablet Take 1 tablet by mouth at bedtime. 90 tablet 2  . hydroxyurea (HYDREA) 500 MG capsule TAKE 2 CAPSULES BY MOUTH ON MONDAY AND FRIDAYS AND 1 CAPSULE BY MOUTH the rest of the week 100 capsule 6  . meclizine (ANTIVERT) 12.5 MG tablet Take 12.5 mg by mouth 3 (three) times daily as needed for dizziness.    . Multiple Vitamin (MULTIVITAMIN WITH MINERALS) TABS  tablet Take 1 tablet by mouth daily.    . sertraline (ZOLOFT) 50 MG tablet Take 0.5 tablets by mouth 2 (two) times daily.    Marland Kitchen tolterodine (DETROL LA) 2 MG 24 hr capsule Take 2 mg by mouth daily.     Current Facility-Administered Medications  Medication Dose Route Frequency Provider Last Rate Last Admin  . influenza vaccine adjuvanted (FLUAD) injection 0.5 mL  0.5 mL Intramuscular Once Alvy Bimler, Ni, MD        PHYSICAL EXAMINATION: ECOG PERFORMANCE STATUS: 1 - Symptomatic but completely ambulatory  Vitals:   12/18/19 1149  BP: (!) 158/90  Pulse: 84  Resp: 18  Temp: 97.7 F (36.5 C)  SpO2: 100%   Filed Weights   12/18/19 1149  Weight: 123 lb (55.8 kg)    GENERAL:alert, no distress and comfortable NEURO: alert & oriented x 3 with fluent speech  LABORATORY DATA:  I have reviewed the data as listed    Component Value Date/Time   NA 139 11/16/2013 1404   K 4.0 11/16/2013 1404   CL 102 07/30/2012 1419   CO2 28 11/16/2013 1404   GLUCOSE 89 11/16/2013 1404   GLUCOSE 102 (H) 07/30/2012 1419   BUN 30.0 (H) 11/16/2013 1404   CREATININE 0.9 11/16/2013 1404   CALCIUM 9.1 11/16/2013 1404   PROT 6.7 11/16/2013 1404   ALBUMIN 4.0  11/16/2013 1404   AST 15 11/16/2013 1404   ALT <6 11/16/2013 1404   ALKPHOS 49 11/16/2013 1404   BILITOT 1.45 (H) 11/16/2013 1404   GFRNONAA 58 (L) 11/22/2009 1855   GFRAA  11/22/2009 1855    >60        The eGFR has been calculated using the MDRD equation. This calculation has not been validated in all clinical situations. eGFR's persistently <60 mL/min signify possible Chronic Kidney Disease.    No results found for: SPEP, UPEP  Lab Results  Component Value Date   WBC 4.1 12/18/2019   NEUTROABS 2.3 12/18/2019   HGB 11.7 (L) 12/18/2019   HCT 36.2 12/18/2019   MCV 105.8 (H) 12/18/2019   PLT 304 12/18/2019      Chemistry      Component Value Date/Time   NA 139 11/16/2013 1404   K 4.0 11/16/2013 1404   CL 102 07/30/2012 1419   CO2 28  11/16/2013 1404   BUN 30.0 (H) 11/16/2013 1404   CREATININE 0.9 11/16/2013 1404      Component Value Date/Time   CALCIUM 9.1 11/16/2013 1404   ALKPHOS 49 11/16/2013 1404   AST 15 11/16/2013 1404   ALT <6 11/16/2013 1404   BILITOT 1.45 (H) 11/16/2013 1404

## 2019-12-18 NOTE — Assessment & Plan Note (Signed)
Her blood pressure is mildly elevated I would defer to her primary care doctor for medical management 

## 2019-12-18 NOTE — Assessment & Plan Note (Signed)
According to the daughter, she continues to have dizziness, could be either related to fluctuation of blood pressure versus her Parkinson's disease She has appointment to see neurologist for further follow-up Again, I do not believe the hydroxyurea or her mild anemia is the cause of her symptoms

## 2019-12-18 NOTE — Assessment & Plan Note (Signed)
The patient has been taking her medications correctly CBC today showed mild progressive anemia I recommend changes to be made to her medications She is instructed to take hydroxyurea 1000 mg on Mondays and Fridays and to take 500 mg for the rest of the week She will continue to take aspirin 81 mg daily Plan to see her back in 6 months

## 2019-12-18 NOTE — Assessment & Plan Note (Signed)
Over the course of the year, she is noted to have very mild progressive anemia As above, I plan to reduce the dose of hydroxyurea I do not believe the mild anemia is the cause of her dizziness

## 2019-12-18 NOTE — Assessment & Plan Note (Signed)
We discussed the importance of preventive care and reviewed the vaccination programs. She does not have any prior allergic reactions to influenza vaccination. She agrees to proceed with influenza vaccination today and we will administer it today at the clinic.  

## 2019-12-18 NOTE — Patient Instructions (Addendum)
1.  Stop carbidopa/levodopa 50/200 at bed  2.  Call me in 2 weeks and if still the same, I will try to decrease the daytime carbidopa/levodopa 25/100 and see how you do.  The physicians and staff at [facility] are committed to providing excellent care. You may receive a survey requesting feedback about your experience at our office. We strive to receive "very good" responses to the survey questions. If you feel that your experience would prevent you from giving the office a "very good " response, please contact our office to try to remedy the situation. We may be reached at [phone]. Thank you for taking the time out of your busy day to complete the survey.

## 2019-12-18 NOTE — Telephone Encounter (Signed)
Scheduled appointments per 9/24 scheduling message. Patient is aware of appointment time and date.

## 2019-12-21 ENCOUNTER — Other Ambulatory Visit: Payer: Self-pay

## 2019-12-21 ENCOUNTER — Ambulatory Visit
Admission: RE | Admit: 2019-12-21 | Discharge: 2019-12-21 | Disposition: A | Discharge status: Home / Self Care | Payer: Medicare Other | Source: Ambulatory Visit | Attending: Family Medicine | Admitting: Family Medicine

## 2019-12-21 DIAGNOSIS — Z1231 Encounter for screening mammogram for malignant neoplasm of breast: Secondary | ICD-10-CM

## 2019-12-23 ENCOUNTER — Ambulatory Visit: Payer: Medicare Other | Admitting: Neurology

## 2019-12-24 ENCOUNTER — Other Ambulatory Visit: Payer: Medicare Other

## 2019-12-24 ENCOUNTER — Ambulatory Visit: Payer: Medicare Other | Admitting: Hematology and Oncology

## 2020-01-04 ENCOUNTER — Other Ambulatory Visit: Payer: Self-pay | Admitting: Neurology

## 2020-01-07 ENCOUNTER — Ambulatory Visit: Payer: Medicare Other | Admitting: Neurology

## 2020-02-07 ENCOUNTER — Other Ambulatory Visit: Payer: Self-pay | Admitting: Neurology

## 2020-02-08 NOTE — Telephone Encounter (Signed)
Rx(s) sent to pharmacy electronically.  

## 2020-02-23 ENCOUNTER — Ambulatory Visit (INDEPENDENT_AMBULATORY_CARE_PROVIDER_SITE_OTHER): Payer: Medicare Other | Admitting: Nurse Practitioner

## 2020-02-23 VITALS — BP 136/88 | HR 82 | Temp 97.3°F | Ht 62.0 in | Wt 119.0 lb

## 2020-02-23 DIAGNOSIS — R42 Dizziness and giddiness: Secondary | ICD-10-CM

## 2020-02-23 DIAGNOSIS — Z8616 Personal history of COVID-19: Secondary | ICD-10-CM

## 2020-02-23 DIAGNOSIS — U099 Post covid-19 condition, unspecified: Secondary | ICD-10-CM

## 2020-02-23 DIAGNOSIS — R251 Tremor, unspecified: Secondary | ICD-10-CM

## 2020-02-23 DIAGNOSIS — R438 Other disturbances of smell and taste: Secondary | ICD-10-CM

## 2020-02-23 DIAGNOSIS — R439 Unspecified disturbances of smell and taste: Secondary | ICD-10-CM

## 2020-02-23 MED ORDER — MEGESTROL ACETATE 20 MG PO TABS: 20.0000 mg | ORAL_TABLET | Freq: Every day | ORAL | 0 refills | Status: AC

## 2020-02-23 NOTE — Patient Instructions (Signed)
History of Covid Dizziness:  Will place referral to Neuro rehab  Stay well hydrated  Stay active    Altered taste and smell:  Will place referral to Dr. Brett Fairy - loss of taste and smell  Decreased appetite:  Hopefully this will improve when taste and smell improves. Will trial short term appetite stimulant.  May try eating 6 small meals throughout the day - high protein - low carb   Follow up:  Follow up after seen by Dr. Brett Fairy

## 2020-02-23 NOTE — Progress Notes (Signed)
@Patient  ID: Carolyn Figueroa, female    DOB: 22-Mar-1946, 74 y.o.   MRN: [id number]  Chief Complaint  Patient presents with  . New Patient (Initial Visit)    COVID 12/2018 Sx: taste and smell, dizziness    Referring provider: [name], MD   74 year old female with history of hypertension, Parkinson's disease, polycythemia vera, tremor, anemia, vitamin D deficiency.  HPI   Patient presents today for post COVID care clinic visit.  She states that she was diagnosed with Covid in October 2020.  Patient states that she still has altered taste and smell.  She also has decreased appetite.  She is requesting an appetite stimulant.  We discussed nutritional needs and handout was given on nutritional rehabilitation.  We will trial appetite stimulant for short time.  Patient also complains of dizziness.  She states that she does have Parkinson's disease but has noticed increased tremor since having Covid.  Her daughter states that she was doing well before Covid and participating in physical therapy related to Parkinson's rehabilitation.  Since she had Covid she has not been able to participate in this activity.  We discussed that we will get her set up with physical therapy at neuro rehab. Denies f/c/s, n/v/d, hemoptysis, PND, chest pain or edema.       No Known Allergies  Immunization History  Administered Date(s) Administered  . Fluad Quad(high Dose 65+) 12/23/2018, 12/18/2019  . Influenza Split 02/11/2007, 02/02/2009, 01/10/2010, 12/21/2010, 01/23/2012  . Influenza,inj,Quad PF,6+ Mos 01/14/2013, 02/08/2015, 11/30/2015, 12/03/2017  . Influenza,inj,quad, With Preservative 12/30/2013  . Influenza-Unspecified 02/11/2007, 02/02/2009, 01/10/2010, 12/21/2010, 02/08/2015, 11/30/2015, 12/18/2016, 12/03/2017  . Pneumococcal Conjugate-13 07/14/2014, 07/14/2014  . Pneumococcal Polysaccharide-23 03/07/2011, 03/07/2011  . Tdap 03/09/2009, 03/09/2009  . Zoster Recombinat (Shingrix)  01/23/2018, 01/23/2018, 03/24/2018, 03/24/2018    Past Medical History:  Diagnosis Date  . Colon polyp 05/2003  . Depression with anxiety   . HTN (hypertension)   . Insomnia   . Need for prophylactic vaccination and inoculation against influenza 01/14/2013  . Polycythemia   . 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 Thrombocytosis   . Tremor 11/16/2013  . Varicose vein   . Vitamin D deficiency   . Vulvar candidiasis   . Weight loss 11/16/2013    Tobacco History: Social History   Tobacco Use  Smoking Status Never Smoker  Smokeless Tobacco Never Used   Counseling given: Yes   Outpatient Encounter Medications as of 02/23/2020  Medication Sig  . aspirin 81 MG tablet Take 81 mg by mouth daily.  . carbidopa-levodopa (SINEMET CR) 50-200 MG tablet Take 1 tablet by mouth at bedtime.  . hydroxyurea (HYDREA) 500 MG capsule TAKE 2 CAPSULES BY MOUTH ON MONDAY AND FRIDAYS AND 1 CAPSULE BY MOUTH the rest of the week  . meclizine (ANTIVERT) 12.5 MG tablet Take 12.5 mg by mouth 3 (three) times daily as needed for dizziness.  . Multiple Vitamin (MULTIVITAMIN WITH MINERALS) TABS tablet Take 1 tablet by mouth daily.  . sertraline (ZOLOFT) 50 MG tablet Take 0.5 tablets by mouth 2 (two) times daily.  Marland Kitchen tolterodine (DETROL LA) 2 MG 24 hr capsule Take 2 mg by mouth daily.  . [DISCONTINUED] Carbidopa-Levodopa ER (SINEMET CR) 25-100 MG tablet controlled release TAKE 2 TABLETS BY MOUTH AT 7:00AM, 2 TABLETS AT 11:00AM AND 2 TABLETS AT 4:00PM  . megestrol (MEGACE) 20 MG tablet Take 1 tablet (20 mg total) by mouth daily.   No facility-administered encounter medications on file as of 02/23/2020.  Review of Systems  Review of Systems  Constitutional: Negative.   HENT: Negative.   Respiratory: Positive for cough and shortness of breath.   Cardiovascular: Negative.   Gastrointestinal: Negative.   Allergic/Immunologic: Negative.   Neurological: Positive for dizziness.       Loss of taste and smell   Psychiatric/Behavioral: Negative.        Physical Exam  BP 136/88 (BP Location: Left Arm)   Pulse 82   Temp (!) 97.3 F (36.3 C)   Ht 5\' 2"  (1.575 m)   Wt 119 lb 0.1 oz (54 kg)   SpO2 98%   BMI 21.77 kg/m   Wt Readings from Last 5 Encounters:  02/23/20 119 lb 0.1 oz (54 kg)  12/18/19 122 lb (55.3 kg)  12/18/19 123 lb (55.8 kg)  08/26/19 122 lb 1.9 oz (55.4 kg)  07/21/19 115 lb (52.2 kg)     Physical Exam Vitals and nursing note reviewed.  Constitutional:      General: She is not in acute distress.    Appearance: She is well-developed.  Cardiovascular:     Rate and Rhythm: Normal rate and regular rhythm.  Pulmonary:     Effort: Pulmonary effort is normal.     Breath sounds: Normal breath sounds.  Neurological:     Mental Status: She is alert and oriented to person, place, and time.  Psychiatric:        Mood and Affect: Mood normal.        Behavior: Behavior normal.        Assessment & Plan:   History of COVID-19 Dizziness:  Will place referral to Neuro rehab  Stay well hydrated  Stay active    Altered taste and smell:  Will place referral to Dr. Brett Fairy - loss of taste and smell  Decreased appetite:  Hopefully this will improve when taste and smell improves. Will trial short term appetite stimulant.  May try eating 6 small meals throughout the day - high protein - low carb   Follow up:  Follow up after seen by Dr. Brett Fairy     Fenton Foy, NP 02/29/2020

## 2020-02-25 ENCOUNTER — Encounter: Payer: Self-pay | Admitting: Neurology

## 2020-02-25 ENCOUNTER — Telehealth: Payer: Self-pay | Admitting: Neurology

## 2020-02-25 NOTE — Telephone Encounter (Signed)
Medication list has been reviewed and the list is up-to-date.   Spoke with patients daughter and she states they contacted the patients PCP. And has an appointment to be seen today.   Daughter states her mom has an appointment with Dr Brett Fairy at [facility] because Dr Tat does not treat the same things. She states that Dr Brett Fairy treats post covid symptoms. She states she doesn't understand why we are asking about other doctors. She states Dr Brett Fairy has an therapy program that helps with appetite.   Daughter states Dr Tat is correct the carbidopa levodopa is not what caused the patient to have dizziness.   Daughter is requesting a call back today before 5pm because this is an urgent matter.

## 2020-02-25 NOTE — Telephone Encounter (Signed)
The daughter wants to know if the carbidopa levodopa can be increased?

## 2020-02-25 NOTE — Telephone Encounter (Signed)
Get a list of her pd meds Parkinsons Disease doesn't usually cause pain.  Have they followed up with PCP? It looks like they have requested a transfer of care to [facility] and have an appointment with [facility] neuro in feb.  She doesn't need 2 different neurologists.

## 2020-02-25 NOTE — Telephone Encounter (Signed)
I'm not sure what the question is.  She is seeing her PCP today regarding the pain (Parkinsons Disease doesn't usually cause pain as I mentioned).  She now agrees that the carbidopa/levodopa wasn't the source of dizziness.

## 2020-02-25 NOTE — Telephone Encounter (Signed)
Patient's daughter called in stating she is having some increasing symptoms. She is having some shoulder pain and some leg pain. She also sometimes cannot move her shoulders or legs. Like they are frozen. That has happened a few times the past few weeks. She also mentioned the last time the patient was in it was mentioned to reduce the evening carbidopa-levodopa. They tried reducing for a few days and went back to previous dosage.

## 2020-02-26 MED ORDER — CARBIDOPA-LEVODOPA 25-100 MG PO TABS: 2.0000 | ORAL_TABLET | Freq: Three times a day (TID) | ORAL | 0 refills | Status: DC

## 2020-02-26 NOTE — Telephone Encounter (Signed)
Last visit the daughter wanted to decrease her med and we held the carbidopa/levodopa 50/200 at bed. It sounds like they went back on that.  Originally we changed the IR to CR in the day b/c of dizziness and nothing changed in terms of dizziness but generally the IR works better, so have them d/c the CR in the DAY (continue carbidopa/levodopa 50/200 at bed) and start carbidopa/levodopa 25/100 2 po tid (this is like going up on the med b/c it is absorbed better).  You can send in the IR

## 2020-02-26 NOTE — Telephone Encounter (Signed)
Spoke with daughter and gave her Dr Doristine Devoid recommendations.   She had a few more questions:  When should she contact the office to let us know if the medication is working.   Did I make Dr Tat aware that they only decreased the medication for 2 days the went back to the way Dr Tat prescribed the medication.   Did I make Dr Tat aware the they have another appt with another Neurologist because Dr Tat does not treat post covid symptoms.   The patient saw the PCP yesterday and was diagnosed with a UTI.  Spoke with Dr Tat who states she normally does not make changes to medications when patients have UTI because it could make the parkinson's worse. Dr Tat made aware of the other concerns as well.   Daughter states they may not need an rx because they may have some Sinemet IR at home.  Rx(s) sent to pharmacy electronically.  Advised that, per Dr Tat, patients daughter does not need to contact the office unless she is the patient is having issues. She voiced understanding.

## 2020-02-29 DIAGNOSIS — R439 Unspecified disturbances of smell and taste: Secondary | ICD-10-CM | POA: Insufficient documentation

## 2020-02-29 DIAGNOSIS — Z8616 Personal history of COVID-19: Secondary | ICD-10-CM | POA: Insufficient documentation

## 2020-02-29 DIAGNOSIS — R438 Other disturbances of smell and taste: Secondary | ICD-10-CM | POA: Insufficient documentation

## 2020-02-29 DIAGNOSIS — U099 Post covid-19 condition, unspecified: Secondary | ICD-10-CM | POA: Insufficient documentation

## 2020-02-29 DIAGNOSIS — R42 Dizziness and giddiness: Secondary | ICD-10-CM | POA: Insufficient documentation

## 2020-02-29 NOTE — Assessment & Plan Note (Signed)
Dizziness:  Will place referral to Neuro rehab  Stay well hydrated  Stay active    Altered taste and smell:  Will place referral to Dr. Brett Fairy - loss of taste and smell  Decreased appetite:  Hopefully this will improve when taste and smell improves. Will trial short term appetite stimulant.  May try eating 6 small meals throughout the day - high protein - low carb   Follow up:  Follow up after seen by Dr. Brett Fairy

## 2020-03-07 ENCOUNTER — Other Ambulatory Visit: Payer: Self-pay

## 2020-03-07 ENCOUNTER — Ambulatory Visit: Payer: Medicare Other | Attending: Nurse Practitioner | Admitting: Physical Therapy

## 2020-03-07 ENCOUNTER — Encounter: Payer: Self-pay | Admitting: Physical Therapy

## 2020-03-07 DIAGNOSIS — M6281 Muscle weakness (generalized): Secondary | ICD-10-CM | POA: Insufficient documentation

## 2020-03-07 DIAGNOSIS — R262 Difficulty in walking, not elsewhere classified: Secondary | ICD-10-CM | POA: Diagnosis present

## 2020-03-07 DIAGNOSIS — R2681 Unsteadiness on feet: Secondary | ICD-10-CM | POA: Insufficient documentation

## 2020-03-07 DIAGNOSIS — R42 Dizziness and giddiness: Secondary | ICD-10-CM | POA: Insufficient documentation

## 2020-03-07 DIAGNOSIS — R2689 Other abnormalities of gait and mobility: Secondary | ICD-10-CM | POA: Insufficient documentation

## 2020-03-09 ENCOUNTER — Telehealth: Payer: Self-pay

## 2020-03-09 NOTE — Telephone Encounter (Signed)
Called per Dr. Alvy Bimler, she reviewed CBC from PCP visit. CBC is stable. Daughter verbalized understanding.

## 2020-03-09 NOTE — Therapy (Signed)
[facility] [address] [place], [place], [address] Phone: [phone]   Fax:  [phone]  Physical Therapy Evaluation  Patient Details  Name: Carolyn Figueroa MRN: [id number] Date of Birth: 11/23/1945 Referring Provider (PT): Fenton Foy, NP   Encounter Date: 03/07/2020   PT End of Session - 03/09/20 1516    Visit Number 1    Number of Visits 13    Date for PT Re-Evaluation 04/20/20    Authorization Type Medicare -- will need 10th visit PN if indicated    Progress Note Due on Visit 10    PT Start Time 1700    PT Stop Time 1745    PT Time Calculation (min) 45 min    Equipment Utilized During Treatment Gait belt    Activity Tolerance Patient tolerated treatment well    Behavior During Therapy [facility] for tasks assessed/performed           Past Medical History:  Diagnosis Date  . Colon polyp 05/2003  . Depression with anxiety   . HTN (hypertension)   . Insomnia   . Need for prophylactic vaccination and inoculation against influenza 01/14/2013  . Polycythemia   . 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 Thrombocytosis   . Tremor 11/16/2013  . Varicose vein   . Vitamin D deficiency   . Vulvar candidiasis   . Weight loss 11/16/2013    Past Surgical History:  Procedure Laterality Date  . NO PAST SURGERIES      There were no vitals filed for this visit.    Subjective Assessment - 03/09/20 1515    Subjective Pt reports increased shaking and tremors. Pt reports problems getting out of the chair. Pt has decreased appetite. Pt reports increased dizziness since COVID (Oct/Nov 2020) with decreased smell and taste. Pt reports increasing LOB at home. Pt reports bilateral shoulder pain for ~2 wks that is affecting how far she can move her arm. Pt reports no longer feeling comfortable driving for the past year due to dizziness.    Patient is accompained by: Family member   daughter   Patient Stated Goals Pt would  like to work on improving balance and dizziness              [facility] PT Assessment - 03/09/20 0001      Assessment   Medical Diagnosis R25.1 (ICD-10-CM) - Tremor  R42 (ICD-10-CM) - Dizziness    Referring Provider (PT) Fenton Foy, NP    Onset Date/Surgical Date --   Oct/Nov 2020   Prior Therapy none      Precautions   Precautions Fall      Balance Screen   Has the patient fallen in the past 6 months No    Has the patient had a decrease in activity level because of a fear of falling?  Yes    Is the patient reluctant to leave their home because of a fear of falling?  Yes      [place] Private residence    Living Arrangements Children   daughter works day shift   Available Help at Discharge Family    Type of [place] to enter    Entrance Stairs-Number of Steps 8    Entrance Stairs-Rails Left;Cannot reach both    [place] One level      Prior Function   Level of Independence Independent   reports increasing difficulty with standing showers  Leisure watch TV, cooking      Observation/Other Assessments   Focus on Therapeutic Outcomes (FOTO)  40%    Other Surveys  Dizziness Handicap Inventory (DHI)    Dizziness Handicap Inventory (DHI)  58      Ambulation/Gait   Ambulation Distance (Feet) 330 Feet    Assistive device None    Gait Pattern Step-through pattern;Decreased stride length;Right flexed knee in stance;Right foot flat;Left foot flat    Ambulation Surface Level;Indoor    Gait velocity 13.56 sec = 2.42 ft/sec      Standardized Balance Assessment   Standardized Balance Assessment Timed Up and Go Test;Dynamic Gait Index;Five Times Sit to Stand    Five times sit to stand comments  17.79      Dynamic Gait Index   Level Surface Mild Impairment    Change in Gait Speed Mild Impairment    Gait with Horizontal Head Turns Mild Impairment    Gait with Vertical Head Turns Mild Impairment    Gait and Pivot Turn  Normal    Step Over Obstacle Normal    Step Around Obstacles Normal    Steps Mild Impairment    Total Score 19    DGI comment: 19/24      Timed Up and Go Test   Normal TUG (seconds) 16.12                      Objective measurements completed on examination: See above findings.               PT Education - 03/09/20 1516    Education Details Discussed exam findings and POC    Person(s) Educated Patient;Child(ren)    Methods Explanation;Verbal cues    Comprehension Verbalized understanding            PT Short Term Goals - 03/09/20 1522      PT SHORT TERM GOAL #1   Title Pt will be independent with initial HEP    Time 3    Period Weeks    Status New    Target Date 03/30/20      PT SHORT TERM GOAL #2   Title Pt will be able to improve 5x STS to <15 sec    Baseline 17.79 sec    Time 3    Period Weeks    Status New    Target Date 03/30/20      PT SHORT TERM GOAL #3   Title Pt will have improved TUG score to </=15 sec    Baseline 16.12 sec    Time 3    Period Weeks    Status New    Target Date 03/30/20      PT SHORT TERM GOAL #4   Title Pt will report subjective decrease in dizziness by 50%    Baseline rates 4/5 dizziness at most with VORx1    Time 3    Period Weeks    Status New    Target Date 03/30/20             PT Long Term Goals - 03/09/20 1528      PT LONG TERM GOAL #1   Title Pt will be independent with final HEP    Time 6    Period Weeks    Status New    Target Date 04/20/20      PT LONG TERM GOAL #2   Title Pt will have improved DHI to </=30, and FOTO to 59%  Baseline DHI 58, 40% FOTO    Time 6    Period Weeks    Status New    Target Date 04/20/20      PT LONG TERM GOAL #3   Title Pt will have improved TUG score to </=12 sec to demo decreased fall risk    Time 6    Period Weeks    Status New    Target Date 04/20/20      PT LONG TERM GOAL #4   Title Pt will have improved DGI to >/=21/24 to demo decreased  fall risk    Baseline 19/24    Time 6    Period Weeks    Status New    Target Date 04/20/20                  Plan - 03/09/20 1517    Clinical Impression Statement Ms. Steppe is a 74 y/o F presenting to OPPT with dizziness s/p COVID in Oct/Nov 2020. Pt has been diagnosed with post-COVID and has history of Parkinson's disease, severe cervical spinal stenosis (pt has declined surgery), depression, HTN, and polycythemia. Pt was very active prior to [place]. On assessment, pt demos decreased general strength, endurance, and balance. Pt's demos decreased gaze stabilization and difficulty with vestibular integration likely contributing to pt's dizziness. Pt would benefit from PT to address these issues and optimize her level of function. Pt with hopes of return to driving.    Personal Factors and Comorbidities Age;Comorbidity 1;Comorbidity 2;Past/Current Experience    Comorbidities post-COVID, Parkinson's disease,severe cervical spinal stenosis (declined surgery), depression, HTN, polycythemia    Examination-Activity Limitations Locomotion Level;Caring for Others;Stand    Examination-Participation Restrictions Driving;Community Activity;Cleaning    Stability/Clinical Decision Making Evolving/Moderate complexity    Clinical Decision Making Moderate    Rehab Potential Good    PT Frequency 2x / week    PT Duration 6 weeks    PT Treatment/Interventions Aquatic Therapy;Cryotherapy;Electrical Stimulation;Moist Heat;DME Instruction;Gait training;Stair training;Functional mobility training;Therapeutic activities;Therapeutic exercise;Balance training;Neuromuscular re-education;Cognitive remediation;Orthotic Fit/Training;Patient/family education;Manual techniques;Passive range of motion;Dry needling;Energy conservation;Vestibular    PT Next Visit Plan Establish vestibular and balance exercise program. Work on dynamic gait.    Consulted and Agree with Plan of Care Patient;Family member/caregiver    Family  Member Consulted Daughter           Patient will benefit from skilled therapeutic intervention in order to improve the following deficits and impairments:  Abnormal gait,Decreased balance,Decreased endurance,Difficulty walking,Dizziness,Decreased activity tolerance,Decreased strength  Visit Diagnosis: Dizziness and giddiness  Other abnormalities of gait and mobility  Difficulty in walking, not elsewhere classified  Unsteadiness on feet  Muscle weakness (generalized)     Problem List Patient Active Problem List   Diagnosis Date Noted  . Dizziness 02/29/2020  . 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 History of COVID-19 02/29/2020  . Anemia due to antineoplastic chemotherapy 12/18/2019  . Preventive measure 12/23/2018  . Needs flu shot 12/03/2017  . Risk for falls 11/30/2015  . Essential hypertension 07/19/2015  . Parkinson disease ([place]) 07/19/2015  . Weight loss 11/16/2013  . Tremor 11/16/2013  . Elevated BUN 08/20/2013  . Leg pain 08/20/2013  . Need for prophylactic vaccination and inoculation against influenza 01/14/2013  . Insomnia 09/23/2012  . Low bone mass 09/23/2012  . Mixed incontinence 09/23/2012  . Vitamin D deficiency 09/23/2012  . Polycythemia vera ([place]) 08/15/2011  . Depression with anxiety   . Polycythemia   . Thrombocytosis  Jamoni Broadfoot April Ma L Lilianna Case PT, DPT 03/09/2020, 3:31 PM  [facility] [address] [place], [place], [address] Phone: [phone]   Fax:  [phone]  Name: Lilli Dewald MRN: [id number] Date of Birth: February 11, 1946

## 2020-03-14 ENCOUNTER — Other Ambulatory Visit: Payer: Self-pay

## 2020-03-14 ENCOUNTER — Ambulatory Visit: Payer: Medicare Other | Admitting: Physical Therapy

## 2020-03-14 DIAGNOSIS — R42 Dizziness and giddiness: Secondary | ICD-10-CM | POA: Diagnosis not present

## 2020-03-14 DIAGNOSIS — R2689 Other abnormalities of gait and mobility: Secondary | ICD-10-CM

## 2020-03-14 DIAGNOSIS — R262 Difficulty in walking, not elsewhere classified: Secondary | ICD-10-CM

## 2020-03-14 DIAGNOSIS — R2681 Unsteadiness on feet: Secondary | ICD-10-CM

## 2020-03-14 DIAGNOSIS — M6281 Muscle weakness (generalized): Secondary | ICD-10-CM

## 2020-03-14 NOTE — Therapy (Signed)
[facility] [address] [place], [place], [address] Phone: [phone]   Fax:  [phone]  Physical Therapy Treatment  Patient Details  Name: Carolyn Figueroa MRN: [id number] Date of Birth: [birth year] Referring Provider (PT): Fenton Foy, NP   Encounter Date: 03/14/2020   PT End of Session - 03/14/20 1839    Visit Number 2    Number of Visits 13    Date for PT Re-Evaluation 04/20/20    Authorization Type Medicare -- will need 10th visit PN if indicated    Progress Note Due on Visit 10    PT Start Time 1745    PT Stop Time 1830    PT Time Calculation (min) 45 min    Equipment Utilized During Treatment Gait belt    Activity Tolerance Patient tolerated treatment well    Behavior During Therapy [facility] for tasks assessed/performed           Past Medical History:  Diagnosis Date  . Colon polyp 05/2003  . Depression with anxiety   . HTN (hypertension)   . Insomnia   . Need for prophylactic vaccination and inoculation against influenza 01/14/2013  . Polycythemia   . 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 Thrombocytosis   . Tremor 11/16/2013  . Varicose vein   . Vitamin D deficiency   . Vulvar candidiasis   . Weight loss 11/16/2013    Past Surgical History:  Procedure Laterality Date  . NO PAST SURGERIES      There were no vitals filed for this visit.   Subjective Assessment - 03/14/20 1755    Subjective Pt reports nothing new or different at home. Pt had a cavity filled today so her mouth is numb.    Patient is accompained by: Family member   daughter   Patient Stated Goals Pt would like to work on improving balance and dizziness    Currently in Pain? No/denies                              Vestibular Treatment/Exercise - 03/14/20 0001      Vestibular Treatment/Exercise   Gaze Exercises X1 Viewing Horizontal;X1 Viewing Vertical;Eye/Head Exercise Horizontal;Eye/Head Exercise  Vertical;X2 Viewing Horizontal;X2 Viewing Vertical      X1 Viewing Horizontal   Foot Position Standing feet together    Comments 60 sec      X1 Viewing Vertical   Foot Position standing feet together    Comments 60 sec      X2 Viewing Horizontal   Foot Position Standing feet together     Comments 60 sec      X2 Viewing Vertical   Foot Position Standing feet together    Comments 60 sec      Eye/Head Exercise Horizontal   Foot Position Standing feet together    Comments Smooth pursuit x30 sec      Eye/Head Exercise Vertical   Foot Position Standing feet together    Comments smooth pursuit 60 sec              Balance Exercises - 03/14/20 0001      Balance Exercises: Standing   Standing Eyes Opened Narrow base of support (BOS);30 secs   2 targets, turn to look over shoulder bilat x10; 2 targets, look at floor & up at ceiling x10   Standing Eyes Closed Narrow base of support (BOS);Solid surface;30 secs;Head turns    Rockerboard Anterior/posterior;30 seconds  x30 sec hold; x10 reps forward/backward   Gait with Head Turns Forward;2 reps   x40' each; horizontal & vertical head turns   Tandem Gait Forward;3 reps;Retro   x10' each            PT Education - 03/14/20 1805    Education Details Discussed HEP    Person(s) Educated Patient;Child(ren)    Methods Explanation;Verbal cues;Handout    Comprehension Verbalized understanding;Verbal cues required            PT Short Term Goals - 03/09/20 1522      PT SHORT TERM GOAL #1   Title Pt will be independent with initial HEP    Time 3    Period Weeks    Status New    Target Date 03/30/20      PT SHORT TERM GOAL #2   Title Pt will be able to improve 5x STS to <15 sec    Baseline 17.79 sec    Time 3    Period Weeks    Status New    Target Date 03/30/20      PT SHORT TERM GOAL #3   Title Pt will have improved TUG score to </=15 sec    Baseline 16.12 sec    Time 3    Period Weeks    Status New    Target Date  03/30/20      PT SHORT TERM GOAL #4   Title Pt will report subjective decrease in dizziness by 50%    Baseline rates 4/5 dizziness at most with VORx1    Time 3    Period Weeks    Status New    Target Date 03/30/20             PT Long Term Goals - 03/09/20 1528      PT LONG TERM GOAL #1   Title Pt will be independent with final HEP    Time 6    Period Weeks    Status New    Target Date 04/20/20      PT LONG TERM GOAL #2   Title Pt will have improved DHI to </=30, and FOTO to 59%    Baseline DHI 58, 40% FOTO    Time 6    Period Weeks    Status New    Target Date 04/20/20      PT LONG TERM GOAL #3   Title Pt will have improved TUG score to </=12 sec to demo decreased fall risk    Time 6    Period Weeks    Status New    Target Date 04/20/20      PT LONG TERM GOAL #4   Title Pt will have improved DGI to >/=21/24 to demo decreased fall risk    Baseline 19/24    Time 6    Period Weeks    Status New    Target Date 04/20/20                 Plan - 03/14/20 1805    Clinical Impression Statement Treatment focused on establishing a vestibular and balance HEP for home. Pt with increased dizziness when performing head + body turns (horizontal>vertical). Worked on weightshifting and dynamic gait. Pt is progressing towards goals.    Personal Factors and Comorbidities Age;Comorbidity 1;Comorbidity 2;Past/Current Experience    Comorbidities post-COVID, Parkinson's disease,severe cervical spinal stenosis (declined surgery), depression, HTN, polycythemia    Examination-Activity Limitations Locomotion Level;Caring for Others;Stand    Examination-Participation  Restrictions Driving;Community Activity;Cleaning    Stability/Clinical Decision Making Evolving/Moderate complexity    Rehab Potential Good    PT Frequency 2x / week    PT Duration 6 weeks    PT Treatment/Interventions Aquatic Therapy;Cryotherapy;Electrical Stimulation;Moist Heat;DME Instruction;Gait training;Stair  training;Functional mobility training;Therapeutic activities;Therapeutic exercise;Balance training;Neuromuscular re-education;Cognitive remediation;Orthotic Fit/Training;Patient/family education;Manual techniques;Passive range of motion;Dry needling;Energy conservation;Vestibular    PT Next Visit Plan Establish vestibular and balance exercise program. Work on dynamic gait.    Consulted and Agree with Plan of Care Patient;Family member/caregiver    Family Member Consulted Daughter           Patient will benefit from skilled therapeutic intervention in order to improve the following deficits and impairments:  Abnormal gait,Decreased balance,Decreased endurance,Difficulty walking,Dizziness,Decreased activity tolerance,Decreased strength  Visit Diagnosis: Dizziness and giddiness  Other abnormalities of gait and mobility  Difficulty in walking, not elsewhere classified  Unsteadiness on feet  Muscle weakness (generalized)     Problem List Patient Active Problem List   Diagnosis Date Noted  . Dizziness 02/29/2020  . 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 History of COVID-19 02/29/2020  . Anemia due to antineoplastic chemotherapy 12/18/2019  . Preventive measure 12/23/2018  . Needs flu shot 12/03/2017  . Risk for falls 11/30/2015  . Essential hypertension 07/19/2015  . Parkinson disease ([place]) 07/19/2015  . Weight loss 11/16/2013  . Tremor 11/16/2013  . Elevated BUN 08/20/2013  . Leg pain 08/20/2013  . Need for prophylactic vaccination and inoculation against influenza 01/14/2013  . Insomnia 09/23/2012  . Low bone mass 09/23/2012  . Mixed incontinence 09/23/2012  . Vitamin D deficiency 09/23/2012  . Polycythemia vera ([place]) 08/15/2011  . Depression with anxiety   . Polycythemia   . Thrombocytosis     Sadarius Norman April Ma L McNeal PT, DPT 03/14/2020, 6:40 PM  [facility] [address] [place], [place], [address] Phone: [phone]   Fax:  [phone]  Name: Carolyn Figueroa MRN: [id number] Date of Birth: 02-Jan-1946

## 2020-03-15 NOTE — Progress Notes (Deleted)
Assessment/Plan:   1.  Parkinsons Disease  -***Continue carbidopa/levodopa 25/100 immediate release, 2 tablets at 7 AM/11 AM/4 PM  -Continue carbidopa/levodopa levodopa 50/200 at bedtime  2.  Severe cervical spinal stenosis  -Neurosurgery recommended surgery, but was declined by the patient.  3.  RBD  -Doing well clinically from that respect  4.  Post Covid loss of smell/taste  -I really do not think that there is anything further that we can clinically do.  Patient and daughter have been frustrated with post Covid symptoms of loss in smell and taste and vertigo.  They have scheduled appointment with [facility] neurology.   Subjective:   Carolyn Figueroa was seen today in follow up for Parkinsons disease.  My previous records were reviewed prior to todays visit as well as outside records available to me. Pt with daughter who supplements the history.  Patient's daughter last visit and wanted to decrease the patient's levodopa, although I was not sure that that was the source of her dizziness, since the dizziness did not correlate with orthostatic hypotension, and since it came on after she had Covid.  Nonetheless, we decreased her medication during the day and her dizziness did not get better and she clinically did worse.  Her daughter ended up calling stating those things and we changed her back to the immediate release tablet.  When the patient was worse, she also had a urinary tract infection.  Her daughter  Current prescribed movement disorder medications: ***Carbidopa/levodopa 25/100 IR, 2 tablets at 7 AM/11 AM/4 PM  Carbidopa/levodopa 50/200 at bedtime    PREVIOUS MEDICATIONS: Carbidopa/levodopa 25/100 CR (did not make a difference in her dizziness, so we changed back to the immediate release, which worked better clinically)  ALLERGIES:  No Known Allergies  CURRENT MEDICATIONS:  Outpatient Encounter Medications as of 03/28/2020  Medication Sig  . aspirin 81 MG tablet Take 81  mg by mouth daily.  . carbidopa-levodopa (SINEMET CR) 50-200 MG tablet Take 1 tablet by mouth at bedtime.  . carbidopa-levodopa (SINEMET IR) 25-100 MG tablet Take 2 tablets by mouth 3 (three) times daily.  . hydroxyurea (HYDREA) 500 MG capsule TAKE 2 CAPSULES BY MOUTH ON MONDAY AND FRIDAYS AND 1 CAPSULE BY MOUTH the rest of the week  . meclizine (ANTIVERT) 12.5 MG tablet Take 12.5 mg by mouth 3 (three) times daily as needed for dizziness.  . megestrol (MEGACE) 20 MG tablet Take 1 tablet (20 mg total) by mouth daily.  . Multiple Vitamin (MULTIVITAMIN WITH MINERALS) TABS tablet Take 1 tablet by mouth daily.  . sertraline (ZOLOFT) 50 MG tablet Take 0.5 tablets by mouth 2 (two) times daily.  Marland Kitchen tolterodine (DETROL LA) 2 MG 24 hr capsule Take 2 mg by mouth daily.   No facility-administered encounter medications on file as of 03/28/2020.    Objective:   PHYSICAL EXAMINATION:    VITALS:  There were no vitals filed for this visit.  GEN:  The patient appears stated age and is in NAD. HEENT:  Normocephalic, atraumatic.  The mucous membranes are moist. The superficial temporal arteries are without ropiness or tenderness. CV:  RRR Lungs:  CTAB Neck/HEME:  There are no carotid bruits bilaterally.  Neurological examination:  Orientation: The patient is alert and oriented x3. Cranial nerves: There is good facial symmetry with*** facial hypomimia. The speech is fluent and clear. Soft palate rises symmetrically and there is no tongue deviation. Hearing is intact to conversational tone. Sensation: Sensation is intact to light touch throughout  Motor: Strength is at least antigravity x4.  Movement examination: Tone: There is ***tone in the *** Abnormal movements: *** Coordination:  There is *** decremation with RAM's, *** Gait and Station: The patient has *** difficulty arising out of a deep-seated chair without the use of the hands. The patient's stride length is ***.  The patient has a *** pull test.      I have reviewed and interpreted the following labs independently    Chemistry      Component Value Date/Time   NA 139 11/16/2013 1404   K 4.0 11/16/2013 1404   CL 102 07/30/2012 1419   CO2 28 11/16/2013 1404   BUN 30.0 (H) 11/16/2013 1404   CREATININE 0.9 11/16/2013 1404      Component Value Date/Time   CALCIUM 9.1 11/16/2013 1404   ALKPHOS 49 11/16/2013 1404   AST 15 11/16/2013 1404   ALT <6 11/16/2013 1404   BILITOT 1.45 (H) 11/16/2013 1404       Lab Results  Component Value Date   WBC 4.1 12/18/2019   HGB 11.7 (L) 12/18/2019   HCT 36.2 12/18/2019   MCV 105.8 (H) 12/18/2019   PLT 304 12/18/2019    Lab Results  Component Value Date   TSH 2.86 12/23/2018     Total time spent on today's visit was ***30 minutes, including both face-to-face time and nonface-to-face time.  Time included that spent on review of records (prior notes available to me/labs/imaging if pertinent), discussing treatment and goals, answering patient's questions and coordinating care.  Cc:  [name], MD

## 2020-03-17 ENCOUNTER — Other Ambulatory Visit: Payer: Self-pay

## 2020-03-17 ENCOUNTER — Ambulatory Visit: Payer: Medicare Other

## 2020-03-17 DIAGNOSIS — R42 Dizziness and giddiness: Secondary | ICD-10-CM | POA: Diagnosis not present

## 2020-03-17 DIAGNOSIS — R2689 Other abnormalities of gait and mobility: Secondary | ICD-10-CM

## 2020-03-17 DIAGNOSIS — R2681 Unsteadiness on feet: Secondary | ICD-10-CM

## 2020-03-17 DIAGNOSIS — R262 Difficulty in walking, not elsewhere classified: Secondary | ICD-10-CM

## 2020-03-17 NOTE — Patient Instructions (Signed)
Gaze Stabilization: Standing Feet Apart    Feet shoulder width apart, keeping eyes on target on wall 3-4 feet away, tilt head down 15-30 and move head side to side for 30 seconds. Repeat while moving head up and down for 60 seconds. Do 2 sessions per day. Do not let symptoms get above 6/10. If they rise take rest break and then resume.   Copyright  VHI. All rights reserved.

## 2020-03-17 NOTE — Therapy (Signed)
[facility] [address] [place], [place], [address] Phone: [phone]   Fax:  [phone]  Physical Therapy Treatment  Patient Details  Name: Carolyn Figueroa MRN: [id number] Date of Birth: [birth year] Referring Provider (PT): Fenton Foy, NP   Encounter Date: 03/17/2020   PT End of Session - 03/17/20 1154    Visit Number 3    Number of Visits 13    Date for PT Re-Evaluation 04/20/20    Authorization Type Medicare -- will need 10th visit PN if indicated    Progress Note Due on Visit 10    PT Start Time 1151    PT Stop Time 1232    PT Time Calculation (min) 41 min    Equipment Utilized During Treatment Gait belt    Activity Tolerance Patient tolerated treatment well    Behavior During Therapy [facility] for tasks assessed/performed           Past Medical History:  Diagnosis Date  . Colon polyp 05/2003  . Depression with anxiety   . HTN (hypertension)   . Insomnia   . Need for prophylactic vaccination and inoculation against influenza 01/14/2013  . Polycythemia   . 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 Thrombocytosis   . Tremor 11/16/2013  . Varicose vein   . Vitamin D deficiency   . Vulvar candidiasis   . Weight loss 11/16/2013    Past Surgical History:  Procedure Laterality Date  . NO PAST SURGERIES      There were no vitals filed for this visit.   Subjective Assessment - 03/17/20 1155    Subjective Patient reports dizziness with exercise at home. No falls.    Patient is accompained by: Family member   daughter   Patient Stated Goals Pt would like to work on improving balance and dizziness    Currently in Pain? No/denies               Vestibular Treatment/Exercise - 03/17/20 0001      Vestibular Treatment/Exercise   Vestibular Treatment Provided Gaze    Gaze Exercises X1 Viewing Horizontal;X1 Viewing Vertical      X1 Viewing Horizontal   Foot Position Seated; Standing Feet Apart     Reps 2    Comments Completed 60 seconds seated x 2 ; Completed 3 x  30 secs standing. increased symptoms with standing > seated      X1 Viewing Vertical   Foot Position Seated; Standing Feet Apart    Reps 2    Comments Completed 60 seconds seated x 2 seated. Completed 2 x 60 secs standing; mild symptoms with completion.      Eye/Head Exercise Horizontal   Foot Position seated    Comments Completed saccades with two squares x 2 rows across, rest break required after completion due to increased dizziness      Eye/Head Exercise Vertical   Foot Position seated    Comments Completed saccades with two squares x 2 rows up/down, mild dizziness.              Balance Exercises - 03/17/20 0001      Balance Exercises: Standing   Standing Eyes Opened Wide (BOA);Head turns;Solid surface;Limitations    Standing Eyes Opened Limitations completed horizontal head turns looking toward targets, completed 3 x 30 seconds. intermittent standing rest breaks due to increased symptoms.             PT Education - 03/17/20 1243    Education Details Reduced  horizontal VOR to 30 seconds due to increased symptoms    Person(s) Educated Patient;Child(ren)    Methods Explanation;Demonstration;Handout    Comprehension Verbalized understanding;Returned demonstration            PT Short Term Goals - 03/09/20 1522      PT SHORT TERM GOAL #1   Title Pt will be independent with initial HEP    Time 3    Period Weeks    Status New    Target Date 03/30/20      PT SHORT TERM GOAL #2   Title Pt will be able to improve 5x STS to <15 sec    Baseline 17.79 sec    Time 3    Period Weeks    Status New    Target Date 03/30/20      PT SHORT TERM GOAL #3   Title Pt will have improved TUG score to </=15 sec    Baseline 16.12 sec    Time 3    Period Weeks    Status New    Target Date 03/30/20      PT SHORT TERM GOAL #4   Title Pt will report subjective decrease in dizziness by 50%    Baseline rates  4/5 dizziness at most with VORx1    Time 3    Period Weeks    Status New    Target Date 03/30/20             PT Long Term Goals - 03/09/20 1528      PT LONG TERM GOAL #1   Title Pt will be independent with final HEP    Time 6    Period Weeks    Status New    Target Date 04/20/20      PT LONG TERM GOAL #2   Title Pt will have improved DHI to </=30, and FOTO to 59%    Baseline DHI 58, 40% FOTO    Time 6    Period Weeks    Status New    Target Date 04/20/20      PT LONG TERM GOAL #3   Title Pt will have improved TUG score to </=12 sec to demo decreased fall risk    Time 6    Period Weeks    Status New    Target Date 04/20/20      PT LONG TERM GOAL #4   Title Pt will have improved DGI to >/=21/24 to demo decreased fall risk    Baseline 19/24    Time 6    Period Weeks    Status New    Target Date 04/20/20                 Plan - 03/17/20 1244    Clinical Impression Statement Continued vestibular activites today working toward VOR x 1 and saccades. Paitent having increased dizziness with standing VOR, reduced to 30 seconds at home vs. 60 seconds due to increased symptoms. Will continue to progress toward goals.    Personal Factors and Comorbidities Age;Comorbidity 1;Comorbidity 2;Past/Current Experience    Comorbidities post-COVID, Parkinson's disease,severe cervical spinal stenosis (declined surgery), depression, HTN, polycythemia    Examination-Activity Limitations Locomotion Level;Caring for Others;Stand    Examination-Participation Restrictions Driving;Community Activity;Cleaning    Stability/Clinical Decision Making Evolving/Moderate complexity    Rehab Potential Good    PT Frequency 2x / week    PT Duration 6 weeks    PT Treatment/Interventions Aquatic Therapy;Cryotherapy;Electrical Stimulation;Moist Heat;DME Instruction;Gait training;Stair training;Functional mobility  training;Therapeutic activities;Therapeutic exercise;Balance training;Neuromuscular  re-education;Cognitive remediation;Orthotic Fit/Training;Patient/family education;Manual techniques;Passive range of motion;Dry needling;Energy conservation;Vestibular    PT Next Visit Plan Establish vestibular and balance exercise program. Work on dynamic gait.    Consulted and Agree with Plan of Care Patient;Family member/caregiver    Family Member Consulted Daughter           Patient will benefit from skilled therapeutic intervention in order to improve the following deficits and impairments:  Abnormal gait,Decreased balance,Decreased endurance,Difficulty walking,Dizziness,Decreased activity tolerance,Decreased strength  Visit Diagnosis: Dizziness and giddiness  Other abnormalities of gait and mobility  Difficulty in walking, not elsewhere classified  Unsteadiness on feet     Problem List Patient Active Problem List   Diagnosis Date Noted  . Dizziness 02/29/2020  . 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 History of COVID-19 02/29/2020  . Anemia due to antineoplastic chemotherapy 12/18/2019  . Preventive measure 12/23/2018  . Needs flu shot 12/03/2017  . Risk for falls 11/30/2015  . Essential hypertension 07/19/2015  . Parkinson disease ([place]) 07/19/2015  . Weight loss 11/16/2013  . Tremor 11/16/2013  . Elevated BUN 08/20/2013  . Leg pain 08/20/2013  . Need for prophylactic vaccination and inoculation against influenza 01/14/2013  . Insomnia 09/23/2012  . Low bone mass 09/23/2012  . Mixed incontinence 09/23/2012  . Vitamin D deficiency 09/23/2012  . Polycythemia vera ([place]) 08/15/2011  . Depression with anxiety   . Polycythemia   . Thrombocytosis     Jones Bales, PT, DPT 03/17/2020, 12:57 PM  [facility] [address] [place], [place], [address] Phone: [phone]   Fax:  [phone]  Name: Carolyn Figueroa MRN: [id number] Date of Birth: 10/08/1945

## 2020-03-21 ENCOUNTER — Other Ambulatory Visit: Payer: Self-pay

## 2020-03-21 ENCOUNTER — Ambulatory Visit: Payer: Medicare Other | Admitting: Physical Therapy

## 2020-03-21 DIAGNOSIS — R262 Difficulty in walking, not elsewhere classified: Secondary | ICD-10-CM

## 2020-03-21 DIAGNOSIS — R2681 Unsteadiness on feet: Secondary | ICD-10-CM

## 2020-03-21 DIAGNOSIS — M6281 Muscle weakness (generalized): Secondary | ICD-10-CM

## 2020-03-21 DIAGNOSIS — R42 Dizziness and giddiness: Secondary | ICD-10-CM

## 2020-03-21 DIAGNOSIS — R2689 Other abnormalities of gait and mobility: Secondary | ICD-10-CM

## 2020-03-21 NOTE — Therapy (Signed)
[facility] [address] [place], [place], [address] Phone: [phone]   Fax:  [phone]  Physical Therapy Treatment  Patient Details  Name: Carolyn Figueroa MRN: [id number] Date of Birth: [birth year] Referring Provider (PT): Ivonne Andrew, NP   Encounter Date: 03/21/2020   PT End of Session - 03/21/20 1809    Visit Number 4    Number of Visits 13    Date for PT Re-Evaluation 04/20/20    Authorization Type Medicare -- will need 10th visit PN if indicated    Progress Note Due on Visit 10    PT Start Time 1705    PT Stop Time 1750    PT Time Calculation (min) 45 min    Equipment Utilized During Treatment Gait belt    Activity Tolerance Patient tolerated treatment well    Behavior During Therapy [facility] for tasks assessed/performed           Past Medical History:  Diagnosis Date  . Colon polyp 05/2003  . Depression with anxiety   . HTN (hypertension)   . Insomnia   . Need for prophylactic vaccination and inoculation against influenza 01/14/2013  . Polycythemia   . 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 Thrombocytosis   . Tremor 11/16/2013  . Varicose vein   . Vitamin D deficiency   . Vulvar candidiasis   . Weight loss 11/16/2013    Past Surgical History:  Procedure Laterality Date  . NO PAST SURGERIES      There were no vitals filed for this visit.   Subjective Assessment - 03/21/20 1708    Subjective Pt reports continued dizziness with exercises at home; however, she is noting less dizziness throughout the day. Pt states it did feel better reducing to 30 sec from 1 minute.    Patient is accompained by: Family member   daughter   Patient Stated Goals Pt would like to work on improving balance and dizziness    Currently in Pain? No/denies                              Vestibular Treatment/Exercise - 03/21/20 0001      X1 Viewing Horizontal   Foot Position Seated; seated on  dynadisk    Reps 2    Comments 30 sec      X1 Viewing Vertical   Foot Position seated; seated on dynadisk    Reps 2    Comments 30 sec      Eye/Head Exercise Horizontal   Foot Position seated on dynadisk    Reps 2    Comments 30 sec saccades; 30 sec 2 targets with head movement      Eye/Head Exercise Vertical   Foot Position seated on dynadisk    Reps 2    Comments 30 sec saccades, 30sec with 2 targets + head movement           SLS forward stepping over black foam x10 reps bilat (challenged with L foot in SLS, R foot stepping over) SLS side stepping over black foam x10 reps bilat  Ambulating in gym ~445' no a/d, CGA with horizontal/vertical head movements Ambulating in gym ~115' no a/d, CGA with bigger stance and cues for heel strike Ambulating in gym ~115' no a/d, CGA with cues for increased gait speed       PT Short Term Goals - 03/09/20 1522      PT SHORT TERM GOAL #  1   Title Pt will be independent with initial HEP    Time 3    Period Weeks    Status New    Target Date 03/30/20      PT SHORT TERM GOAL #2   Title Pt will be able to improve 5x STS to <15 sec    Baseline 17.79 sec    Time 3    Period Weeks    Status New    Target Date 03/30/20      PT SHORT TERM GOAL #3   Title Pt will have improved TUG score to </=15 sec    Baseline 16.12 sec    Time 3    Period Weeks    Status New    Target Date 03/30/20      PT SHORT TERM GOAL #4   Title Pt will report subjective decrease in dizziness by 50%    Baseline rates 4/5 dizziness at most with VORx1    Time 3    Period Weeks    Status New    Target Date 03/30/20             PT Long Term Goals - 03/09/20 1528      PT LONG TERM GOAL #1   Title Pt will be independent with final HEP    Time 6    Period Weeks    Status New    Target Date 04/20/20      PT LONG TERM GOAL #2   Title Pt will have improved DHI to </=30, and FOTO to 59%    Baseline DHI 58, 40% FOTO    Time 6    Period Weeks     Status New    Target Date 04/20/20      PT LONG TERM GOAL #3   Title Pt will have improved TUG score to </=12 sec to demo decreased fall risk    Time 6    Period Weeks    Status New    Target Date 04/20/20      PT LONG TERM GOAL #4   Title Pt will have improved DGI to >/=21/24 to demo decreased fall risk    Baseline 19/24    Time 6    Period Weeks    Status New    Target Date 04/20/20                 Plan - 03/21/20 1751    Clinical Impression Statement Adjusted pt's vestibular exercises as she complains she gets too dizzy with standing exercises -- discussed having her perform exercises seated on pillows. Discussed performing standing exercises if she's having a good day. Rest of session focused on dynamic gait with head turns/head nods and stepping.    Personal Factors and Comorbidities Age;Comorbidity 1;Comorbidity 2;Past/Current Experience    Comorbidities post-COVID, Parkinson's disease,severe cervical spinal stenosis (declined surgery), depression, HTN, polycythemia    Examination-Activity Limitations Locomotion Level;Caring for Others;Stand    Examination-Participation Restrictions Driving;Community Activity;Cleaning    Stability/Clinical Decision Making Evolving/Moderate complexity    Rehab Potential Good    PT Frequency 2x / week    PT Duration 6 weeks    PT Treatment/Interventions Aquatic Therapy;Cryotherapy;Electrical Stimulation;Moist Heat;DME Instruction;Gait training;Stair training;Functional mobility training;Therapeutic activities;Therapeutic exercise;Balance training;Neuromuscular re-education;Cognitive remediation;Orthotic Fit/Training;Patient/family education;Manual techniques;Passive range of motion;Dry needling;Energy conservation;Vestibular    PT Next Visit Plan Continue to progress vestibular, balance, and dynamic gait exercises as able.    PT Home Exercise Plan Access Code: [facility]  Consulted and Agree with Plan of Care Patient;Family  member/caregiver    Family Member Consulted Daughter           Patient will benefit from skilled therapeutic intervention in order to improve the following deficits and impairments:  Abnormal gait,Decreased balance,Decreased endurance,Difficulty walking,Dizziness,Decreased activity tolerance,Decreased strength  Visit Diagnosis: Dizziness and giddiness  Other abnormalities of gait and mobility  Difficulty in walking, not elsewhere classified  Unsteadiness on feet  Muscle weakness (generalized)     Problem List Patient Active Problem List   Diagnosis Date Noted  . Dizziness 02/29/2020  . 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 History of COVID-19 02/29/2020  . Anemia due to antineoplastic chemotherapy 12/18/2019  . Preventive measure 12/23/2018  . Needs flu shot 12/03/2017  . Risk for falls 11/30/2015  . Essential hypertension 07/19/2015  . Parkinson disease ([place]) 07/19/2015  . Weight loss 11/16/2013  . Tremor 11/16/2013  . Elevated BUN 08/20/2013  . Leg pain 08/20/2013  . Need for prophylactic vaccination and inoculation against influenza 01/14/2013  . Insomnia 09/23/2012  . Low bone mass 09/23/2012  . Mixed incontinence 09/23/2012  . Vitamin D deficiency 09/23/2012  . Polycythemia vera ([place]) 08/15/2011  . Depression with anxiety   . Polycythemia   . Thrombocytosis     Olympia Adelsberger April Ma L [place] PT, DPT 03/21/2020, 6:10 PM  [facility] [address] [place], [place], [address] Phone: [phone]   Fax:  [phone]  Name: Carolyn Figueroa MRN: [id number] Date of Birth: Aug 09, 1945

## 2020-03-23 ENCOUNTER — Ambulatory Visit: Payer: Medicare Other | Admitting: Physical Therapy

## 2020-03-28 ENCOUNTER — Ambulatory Visit: Payer: Medicare Other | Admitting: Neurology

## 2020-03-29 ENCOUNTER — Other Ambulatory Visit: Payer: Self-pay

## 2020-03-29 ENCOUNTER — Ambulatory Visit: Payer: Medicare Other | Attending: Nurse Practitioner | Admitting: Physical Therapy

## 2020-03-29 DIAGNOSIS — R262 Difficulty in walking, not elsewhere classified: Secondary | ICD-10-CM

## 2020-03-29 DIAGNOSIS — M6281 Muscle weakness (generalized): Secondary | ICD-10-CM

## 2020-03-29 DIAGNOSIS — R42 Dizziness and giddiness: Secondary | ICD-10-CM

## 2020-03-29 DIAGNOSIS — R2681 Unsteadiness on feet: Secondary | ICD-10-CM | POA: Diagnosis present

## 2020-03-29 DIAGNOSIS — R2689 Other abnormalities of gait and mobility: Secondary | ICD-10-CM | POA: Diagnosis present

## 2020-03-29 NOTE — Therapy (Signed)
[facility] [address] [place], [place], [address] Phone: [phone]   Fax:  [phone]  Physical Therapy Treatment  Patient Details  Name: Carolyn Figueroa MRN: [id number] Date of Birth: Aug 05, 1945 Referring Provider (PT): Fenton Foy, NP   Encounter Date: 03/29/2020   PT End of Session - 03/29/20 1759    Visit Number 5    Number of Visits 13    Date for PT Re-Evaluation 04/20/20    Authorization Type Medicare -- will need 10th visit PN if indicated    Progress Note Due on Visit 10    PT Start Time 1707    PT Stop Time 1750    PT Time Calculation (min) 43 min    Equipment Utilized During Treatment Gait belt    Activity Tolerance Patient tolerated treatment well    Behavior During Therapy [facility] for tasks assessed/performed           Past Medical History:  Diagnosis Date  . Colon polyp 05/2003  . Depression with anxiety   . HTN (hypertension)   . Insomnia   . Need for prophylactic vaccination and inoculation against influenza 01/14/2013  . Polycythemia   . 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 Thrombocytosis   . Tremor 11/16/2013  . Varicose vein   . Vitamin D deficiency   . Vulvar candidiasis   . Weight loss 11/16/2013    Past Surgical History:  Procedure Laterality Date  . NO PAST SURGERIES      There were no vitals filed for this visit.   Subjective Assessment - 03/29/20 1710    Subjective Pt states that the sitting exercises are easier but it can still be very dizzying for her. Pt states she was very fatigued last week and did not feel well. Pt states her legs feel numb/heavy.    Patient is accompained by: Family member   daughter   Patient Stated Goals Pt would like to work on improving balance and dizziness    Currently in Pain? No/denies              [facility] PT Assessment - 03/29/20 0001      Standardized Balance Assessment   Five times sit to stand comments  10.4      Timed Up  and Go Test   Normal TUG (seconds) 14.28                         [facility] Adult PT Treatment/Exercise - 03/29/20 0001      Exercises   Exercises Other Exercises;Knee/Hip    Other Exercises  Sci-fit x5 min level 3 LEs only      Knee/Hip Exercises: Standing   Other Standing Knee Exercises lateral band walk red tband 4 x10'    Other Standing Knee Exercises forward/backward walking red tband 2x10'           Vestibular Treatment/Exercise - 03/29/20 0001      X1 Viewing Horizontal   Foot Position seated    Comments 30 sec              Balance Exercises - 03/29/20 0001      Balance Exercises: Standing   Standing Eyes Closed Narrow base of support (BOS);Solid surface;30 secs;Head turns;Foam/compliant surface   x30 sec head turns & head nods on each surface            PT Education - 03/29/20 1802    Education Details Discussed pt's  progress with STGs. Discussed updating her HEP to include balance and strengthening.    Person(s) Educated Patient;Child(ren)    Methods Explanation;Demonstration;Handout    Comprehension Verbalized understanding;Returned demonstration            PT Short Term Goals - 03/29/20 1739      PT SHORT TERM GOAL #1   Title Pt will be independent with initial HEP    Time 3    Period Weeks    Status New    Target Date 03/30/20      PT SHORT TERM GOAL #2   Title Pt will be able to improve 5x STS to <15 sec    Baseline 17.79 sec; 03/29/20 10.40 sec    Time 3    Period Weeks    Status Achieved    Target Date 03/30/20      PT SHORT TERM GOAL #3   Title Pt will have improved TUG score to </=15 sec    Baseline 16.12 sec; 03/29/20 14.28 sec    Time 3    Period Weeks    Status Achieved    Target Date 03/30/20      PT SHORT TERM GOAL #4   Title Pt will report subjective decrease in dizziness by 50%    Baseline rates 4/5 dizziness at most with VORx1; 03/29/20 3/5 dizziness rating    Time 3    Period Weeks    Status Partially Met     Target Date 03/30/20             PT Long Term Goals - 03/09/20 1528      PT LONG TERM GOAL #1   Title Pt will be independent with final HEP    Time 6    Period Weeks    Status New    Target Date 04/20/20      PT LONG TERM GOAL #2   Title Pt will have improved DHI to </=30, and FOTO to 59%    Baseline DHI 58, 40% FOTO    Time 6    Period Weeks    Status New    Target Date 04/20/20      PT LONG TERM GOAL #3   Title Pt will have improved TUG score to </=12 sec to demo decreased fall risk    Time 6    Period Weeks    Status New    Target Date 04/20/20      PT LONG TERM GOAL #4   Title Pt will have improved DGI to >/=21/24 to demo decreased fall risk    Baseline 19/24    Time 6    Period Weeks    Status New    Target Date 04/20/20                 Plan - 03/29/20 1719    Clinical Impression Statement Treatment focued on establishing balance and strengthening exercises to supplement pt's vestibular exercises. At this time, pt feels that seated on pillows while performing her vestibular exercises are working better for her -- did not update this exercise this session. Checked pt's STGs; pt has met STG #1 to #3. Pt did not quite meet STG #4 as her dizziness with VOR x1 exercises in seated was rated as 3/5 dizzy instead of 2/5. Pt is meeting her PT goals and progressing.    Personal Factors and Comorbidities Age;Comorbidity 1;Comorbidity 2;Past/Current Experience    Comorbidities post-COVID, Parkinson's disease,severe cervical spinal stenosis (declined surgery), depression, HTN,  polycythemia    Examination-Activity Limitations Patent attorney for Others;Stand    Examination-Participation Restrictions Driving;Community Activity;Cleaning    Stability/Clinical Decision Making Evolving/Moderate complexity    Rehab Potential Good    PT Frequency 2x / week    PT Duration 6 weeks    PT Treatment/Interventions Aquatic Therapy;Cryotherapy;Electrical Stimulation;Moist  Heat;DME Instruction;Gait training;Stair training;Functional mobility training;Therapeutic activities;Therapeutic exercise;Balance training;Neuromuscular re-education;Cognitive remediation;Orthotic Fit/Training;Patient/family education;Manual techniques;Passive range of motion;Dry needling;Energy conservation;Vestibular    PT Next Visit Plan Sci fit warm up. Continue to progress vestibular, balance, dynamic gait and strengthening exercises.    PT Home Exercise Plan Access Code: [facility]    Consulted and Agree with Plan of Care Patient;Family member/caregiver    Family Member Consulted Daughter           Patient will benefit from skilled therapeutic intervention in order to improve the following deficits and impairments:  Abnormal gait,Decreased balance,Decreased endurance,Difficulty walking,Dizziness,Decreased activity tolerance,Decreased strength  Visit Diagnosis: Dizziness and giddiness  Other abnormalities of gait and mobility  Difficulty in walking, not elsewhere classified  Unsteadiness on feet  Muscle weakness (generalized)     Problem List Patient Active Problem List   Diagnosis Date Noted  . Dizziness 02/29/2020  . 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 History of COVID-19 02/29/2020  . Anemia due to antineoplastic chemotherapy 12/18/2019  . Preventive measure 12/23/2018  . Needs flu shot 12/03/2017  . Risk for falls 11/30/2015  . Essential hypertension 07/19/2015  . Parkinson disease ([place]) 07/19/2015  . Weight loss 11/16/2013  . Tremor 11/16/2013  . Elevated BUN 08/20/2013  . Leg pain 08/20/2013  . Need for prophylactic vaccination and inoculation against influenza 01/14/2013  . Insomnia 09/23/2012  . Low bone mass 09/23/2012  . Mixed incontinence 09/23/2012  . Vitamin D deficiency 09/23/2012  . Polycythemia vera ([place]) 08/15/2011  . Depression with anxiety   . Polycythemia   . Thrombocytosis     Gabriele Loveland April Ma L  [place] PT, DPT 03/29/2020, 6:05 PM  [facility] [address] [place], [place], [address] Phone: [phone]   Fax:  [phone]  Name: Carolyn Figueroa MRN: [id number] Date of Birth: Jul 31, 1945

## 2020-04-01 ENCOUNTER — Ambulatory Visit: Payer: Medicare Other | Admitting: Physical Therapy

## 2020-04-01 ENCOUNTER — Other Ambulatory Visit: Payer: Self-pay

## 2020-04-01 DIAGNOSIS — R42 Dizziness and giddiness: Secondary | ICD-10-CM

## 2020-04-01 DIAGNOSIS — R2681 Unsteadiness on feet: Secondary | ICD-10-CM

## 2020-04-01 DIAGNOSIS — M6281 Muscle weakness (generalized): Secondary | ICD-10-CM

## 2020-04-01 DIAGNOSIS — R2689 Other abnormalities of gait and mobility: Secondary | ICD-10-CM

## 2020-04-01 DIAGNOSIS — R262 Difficulty in walking, not elsewhere classified: Secondary | ICD-10-CM

## 2020-04-01 NOTE — Therapy (Signed)
[facility] [address] [place], [place], [address] Phone: [phone]   Fax:  [phone]  Physical Therapy Treatment  Patient Details  Name: Carolyn Figueroa MRN: [id number] Date of Birth: 1946/03/06 Referring Provider (PT): Fenton Foy, NP   Encounter Date: 04/01/2020   PT End of Session - 04/01/20 1538    Visit Number 6    Number of Visits 13    Date for PT Re-Evaluation 04/20/20    Authorization Type Medicare -- will need 10th visit PN if indicated    Progress Note Due on Visit 10    PT Start Time 1530    PT Stop Time 1615    PT Time Calculation (min) 45 min    Equipment Utilized During Treatment Gait belt    Activity Tolerance Patient tolerated treatment well    Behavior During Therapy [facility] for tasks assessed/performed           Past Medical History:  Diagnosis Date  . Colon polyp 05/2003  . Depression with anxiety   . HTN (hypertension)   . Insomnia   . Need for prophylactic vaccination and inoculation against influenza 01/14/2013  . Polycythemia   . 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 Thrombocytosis   . Tremor 11/16/2013  . Varicose vein   . Vitamin D deficiency   . Vulvar candidiasis   . Weight loss 11/16/2013    Past Surgical History:  Procedure Laterality Date  . NO PAST SURGERIES      There were no vitals filed for this visit.   Subjective Assessment - 04/01/20 1536    Subjective Pt reports she is very tired and sleepy today.    Patient is accompained by: Family member   daughter   Patient Stated Goals Pt would like to work on improving balance and dizziness    Currently in Pain? No/denies                             [facility] Adult PT Treatment/Exercise - 04/01/20 0001      Exercises   Other Exercises  Sci-fit x5 min level 2.5 LEs only           Vestibular Treatment/Exercise - 04/01/20 0001      X1 Viewing Horizontal   Foot Position seated on dynadisk;  standing    Comments 40 sec      X1 Viewing Vertical   Foot Position seated on dynadisk; standing    Comments 40 sec      X2 Viewing Horizontal   Foot Position seated on dynadisk    Reps 10     Comments x2 sets      X2 Viewing Vertical   Foot Position seated on dynadisk    Reps 10    Comments x2 sets          Seated on dynadisk, no UE with trunk rotation x20 bilat    Balance Exercises - 04/01/20 0001      Balance Exercises: Standing   Standing Eyes Closed Narrow base of support (BOS);30 secs;Foam/compliant surface;2 reps;Head turns    SLS with Vectors Foam/compliant surface   10 reps hip abd bilat, 10 reps hamstring curl bilat   Marching Foam/compliant surface;Static;10 reps               PT Short Term Goals - 03/29/20 1739      PT SHORT TERM GOAL #1   Title Pt will be  independent with initial HEP    Time 3    Period Weeks    Status New    Target Date 03/30/20      PT SHORT TERM GOAL #2   Title Pt will be able to improve 5x STS to <15 sec    Baseline 17.79 sec; 03/29/20 10.40 sec    Time 3    Period Weeks    Status Achieved    Target Date 03/30/20      PT SHORT TERM GOAL #3   Title Pt will have improved TUG score to </=15 sec    Baseline 16.12 sec; 03/29/20 14.28 sec    Time 3    Period Weeks    Status Achieved    Target Date 03/30/20      PT SHORT TERM GOAL #4   Title Pt will report subjective decrease in dizziness by 50%    Baseline rates 4/5 dizziness at most with VORx1; 03/29/20 3/5 dizziness rating    Time 3    Period Weeks    Status Partially Met    Target Date 03/30/20             PT Long Term Goals - 03/09/20 1528      PT LONG TERM GOAL #1   Title Pt will be independent with final HEP    Time 6    Period Weeks    Status New    Target Date 04/20/20      PT LONG TERM GOAL #2   Title Pt will have improved DHI to </=30, and FOTO to 59%    Baseline DHI 58, 40% FOTO    Time 6    Period Weeks    Status New    Target Date 04/20/20       PT LONG TERM GOAL #3   Title Pt will have improved TUG score to </=12 sec to demo decreased fall risk    Time 6    Period Weeks    Status New    Target Date 04/20/20      PT LONG TERM GOAL #4   Title Pt will have improved DGI to >/=21/24 to demo decreased fall risk    Baseline 19/24    Time 6    Period Weeks    Status New    Target Date 04/20/20                 Plan - 04/01/20 1537    Clinical Impression Statement Treatment focused on balance and increasing pt's vestibular exercises. Pt with less dizzy symptoms while performing VOR in sitting; pt still too symptomatic with standing. Pt with decreased L neck rotation and requires cueing during x1. Pt able to tolerate x2 progression in seated. Treatment session limited due to pt with increased fatigue today.    Personal Factors and Comorbidities Age;Comorbidity 1;Comorbidity 2;Past/Current Experience    Comorbidities post-COVID, Parkinson's disease,severe cervical spinal stenosis (declined surgery), depression, HTN, polycythemia    Examination-Activity Limitations Locomotion Level;Caring for Others;Stand    Examination-Participation Restrictions Driving;Community Activity;Cleaning    Stability/Clinical Decision Making Evolving/Moderate complexity    Rehab Potential Good    PT Frequency 2x / week    PT Duration 6 weeks    PT Treatment/Interventions Aquatic Therapy;Cryotherapy;Electrical Stimulation;Moist Heat;DME Instruction;Gait training;Stair training;Functional mobility training;Therapeutic activities;Therapeutic exercise;Balance training;Neuromuscular re-education;Cognitive remediation;Orthotic Fit/Training;Patient/family education;Manual techniques;Passive range of motion;Dry needling;Energy conservation;Vestibular    PT Next Visit Plan Sci fit warm up. Continue to progress vestibular, balance, dynamic gait and  strengthening exercises as tolerated. Pt has been tolerating x2 in seated.    PT Home Exercise Plan Access Code:  [facility]    Consulted and Agree with Plan of Care Patient    Family Member Consulted --           Patient will benefit from skilled therapeutic intervention in order to improve the following deficits and impairments:  Abnormal gait,Decreased balance,Decreased endurance,Difficulty walking,Dizziness,Decreased activity tolerance,Decreased strength  Visit Diagnosis: Dizziness and giddiness  Other abnormalities of gait and mobility  Difficulty in walking, not elsewhere classified  Unsteadiness on feet  Muscle weakness (generalized)     Problem List Patient Active Problem List   Diagnosis Date Noted  . Dizziness 02/29/2020  . 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 History of COVID-19 02/29/2020  . Anemia due to antineoplastic chemotherapy 12/18/2019  . Preventive measure 12/23/2018  . Needs flu shot 12/03/2017  . Risk for falls 11/30/2015  . Essential hypertension 07/19/2015  . Parkinson disease ([place]) 07/19/2015  . Weight loss 11/16/2013  . Tremor 11/16/2013  . Elevated BUN 08/20/2013  . Leg pain 08/20/2013  . Need for prophylactic vaccination and inoculation against influenza 01/14/2013  . Insomnia 09/23/2012  . Low bone mass 09/23/2012  . Mixed incontinence 09/23/2012  . Vitamin D deficiency 09/23/2012  . Polycythemia vera ([name]) 08/15/2011  . Depression with anxiety   . Polycythemia   . Thrombocytosis     Sadik Piascik April Ma L Barrett PT, DPT 04/01/2020, 4:24 PM  [facility] [address] [place], [place], [address] Phone: [phone]   Fax:  [phone]  Name: Carolyn Figueroa MRN: [id number] Date of Birth: 1946-01-05

## 2020-04-05 ENCOUNTER — Other Ambulatory Visit: Payer: Self-pay

## 2020-04-05 ENCOUNTER — Ambulatory Visit: Payer: Medicare Other | Admitting: Physical Therapy

## 2020-04-05 DIAGNOSIS — R2681 Unsteadiness on feet: Secondary | ICD-10-CM

## 2020-04-05 DIAGNOSIS — R262 Difficulty in walking, not elsewhere classified: Secondary | ICD-10-CM

## 2020-04-05 DIAGNOSIS — R42 Dizziness and giddiness: Secondary | ICD-10-CM

## 2020-04-05 DIAGNOSIS — M6281 Muscle weakness (generalized): Secondary | ICD-10-CM

## 2020-04-05 DIAGNOSIS — R2689 Other abnormalities of gait and mobility: Secondary | ICD-10-CM

## 2020-04-05 NOTE — Therapy (Signed)
[name] [address] [place], [place], [address] Phone: [phone]   Fax:  [phone]  Physical Therapy Treatment  Patient Details  Name: Carolyn Figueroa MRN: [id number] Date of Birth: 06-11-1945 Referring Provider (PT): Fenton Foy, NP   Encounter Date: 04/05/2020   PT End of Session - 04/05/20 1709    Visit Number 7    Number of Visits 13    Date for PT Re-Evaluation 04/20/20    Authorization Type Medicare -- will need 10th visit PN if indicated    Progress Note Due on Visit 10    PT Start Time 1705    PT Stop Time 1750    PT Time Calculation (min) 45 min    Equipment Utilized During Treatment Gait belt    Activity Tolerance Patient tolerated treatment well    Behavior During Therapy [facility] for tasks assessed/performed           Past Medical History:  Diagnosis Date  . Colon polyp 05/2003  . Depression with anxiety   . HTN (hypertension)   . Insomnia   . Need for prophylactic vaccination and inoculation against influenza 01/14/2013  . Polycythemia   . 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 Thrombocytosis   . Tremor 11/16/2013  . Varicose vein   . Vitamin D deficiency   . Vulvar candidiasis   . Weight loss 11/16/2013    Past Surgical History:  Procedure Laterality Date  . NO PAST SURGERIES      There were no vitals filed for this visit.   Subjective Assessment - 04/05/20 1712    Subjective Pt reports she is feeling okay today. Pt feels her legs aren't stable today. Pt states the exercises have been going pretty good.    Patient is accompained by: Family member   daughter   Patient Stated Goals Pt would like to work on improving balance and dizziness    Currently in Pain? No/denies                     Standing feet together:  Gaze stabilization with body circles x10 CW & CCW   TUG x3; best time 13.47 sec Amb ~30':  x1 rep with horizontal head turns  x1 rep with head  nods  x1 rep with horizontal gaze looking at 2 targets  x1 rep with vertical gaze looking at 2 targets (inreased symptoms)  x1 rep with head turned to right -- pt maintaining path  x1 rep with head turned to left -- pt maintaining path      [facility] Adult PT Treatment/Exercise - 04/05/20 0001      Exercises   Other Exercises  Sci-fit x5 min level 1.5 LEs only               Balance Exercises - 04/05/20 0001      Balance Exercises: Standing   Standing Eyes Closed Narrow base of support (BOS);30 secs;Foam/compliant surface;Head turns;2 reps   head turns & head nods 2x30 sec each   Step Over Hurdles / Cones stepping over 2 black foam ~5' apart x 6 reps with no UE support               PT Short Term Goals - 03/29/20 1739      PT SHORT TERM GOAL #1   Title Pt will be independent with initial HEP    Time 3    Period Weeks    Status New  Target Date 03/30/20      PT SHORT TERM GOAL #2   Title Pt will be able to improve 5x STS to <15 sec    Baseline 17.79 sec; 03/29/20 10.40 sec    Time 3    Period Weeks    Status Achieved    Target Date 03/30/20      PT SHORT TERM GOAL #3   Title Pt will have improved TUG score to </=15 sec    Baseline 16.12 sec; 03/29/20 14.28 sec    Time 3    Period Weeks    Status Achieved    Target Date 03/30/20      PT SHORT TERM GOAL #4   Title Pt will report subjective decrease in dizziness by 50%    Baseline rates 4/5 dizziness at most with VORx1; 03/29/20 3/5 dizziness rating    Time 3    Period Weeks    Status Partially Met    Target Date 03/30/20             PT Long Term Goals - 03/09/20 1528      PT LONG TERM GOAL #1   Title Pt will be independent with final HEP    Time 6    Period Weeks    Status New    Target Date 04/20/20      PT LONG TERM GOAL #2   Title Pt will have improved DHI to </=30, and FOTO to 59%    Baseline DHI 58, 40% FOTO    Time 6    Period Weeks    Status New    Target Date 04/20/20      PT LONG  TERM GOAL #3   Title Pt will have improved TUG score to </=12 sec to demo decreased fall risk    Time 6    Period Weeks    Status New    Target Date 04/20/20      PT LONG TERM GOAL #4   Title Pt will have improved DGI to >/=21/24 to demo decreased fall risk    Baseline 19/24    Time 6    Period Weeks    Status New    Target Date 04/20/20                 Plan - 04/05/20 1801    Clinical Impression Statement Treatment focused on progressing pt's balance and dynamic gait. Pt demos decreased confidence with balance tasks despite demonstrating decreased sway with eyes closed on compliant surface. Pt able to tolerate walking exercises with saccadic eye movement and head turning -- symptoms most increased with head nods. Pt limited due to fatigue. Pt with improving TUG time to 13.47 sec. Pt is slowly progressing towards goals but seems very limited by her low energy level and concerns about her decrease in overall mobility because of her post-COVID fatigue.    Personal Factors and Comorbidities Age;Comorbidity 1;Comorbidity 2;Past/Current Experience    Comorbidities post-COVID, Parkinson's disease,severe cervical spinal stenosis (declined surgery), depression, HTN, polycythemia    Examination-Activity Limitations Locomotion Level;Caring for Others;Stand    Examination-Participation Restrictions Driving;Community Activity;Cleaning    Stability/Clinical Decision Making Evolving/Moderate complexity    Rehab Potential Good    PT Frequency 2x / week    PT Duration 6 weeks    PT Treatment/Interventions Aquatic Therapy;Cryotherapy;Electrical Stimulation;Moist Heat;DME Instruction;Gait training;Stair training;Functional mobility training;Therapeutic activities;Therapeutic exercise;Balance training;Neuromuscular re-education;Cognitive remediation;Orthotic Fit/Training;Patient/family education;Manual techniques;Passive range of motion;Dry needling;Energy conservation;Vestibular    PT Next Visit  Plan  Sci fit warm up. Continue to progress vestibular, balance, dynamic gait and strengthening exercises as tolerated. Pt has been tolerating x2 in seated.    PT Home Exercise Plan Access Code: [facility]    Consulted and Agree with Plan of Care Patient    Family Member Consulted Daughter           Patient will benefit from skilled therapeutic intervention in order to improve the following deficits and impairments:  Abnormal gait,Decreased balance,Decreased endurance,Difficulty walking,Dizziness,Decreased activity tolerance,Decreased strength  Visit Diagnosis: Dizziness and giddiness  Other abnormalities of gait and mobility  Difficulty in walking, not elsewhere classified  Unsteadiness on feet  Muscle weakness (generalized)     Problem List Patient Active Problem List   Diagnosis Date Noted  . Dizziness 02/29/2020  . 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 History of COVID-19 02/29/2020  . Anemia due to antineoplastic chemotherapy 12/18/2019  . Preventive measure 12/23/2018  . Needs flu shot 12/03/2017  . Risk for falls 11/30/2015  . Essential hypertension 07/19/2015  . Parkinson disease ([place]) 07/19/2015  . Weight loss 11/16/2013  . Tremor 11/16/2013  . Elevated BUN 08/20/2013  . Leg pain 08/20/2013  . Need for prophylactic vaccination and inoculation against influenza 01/14/2013  . Insomnia 09/23/2012  . Low bone mass 09/23/2012  . Mixed incontinence 09/23/2012  . Vitamin D deficiency 09/23/2012  . Polycythemia vera ([place]) 08/15/2011  . Depression with anxiety   . Polycythemia   . Thrombocytosis     Nesbit Michon April Ma L Philip PT, DPT 04/05/2020, 6:12 PM  [facility] [address] [place], [place], [address] Phone: [phone]   Fax:  [phone]  Name: Jonna Dittrich MRN: [id number] Date of Birth: 09/11/1945

## 2020-04-07 ENCOUNTER — Ambulatory Visit: Payer: Medicare Other | Admitting: Physical Therapy

## 2020-04-12 ENCOUNTER — Ambulatory Visit: Payer: Medicare Other | Admitting: Physical Therapy

## 2020-04-15 ENCOUNTER — Ambulatory Visit: Payer: Medicare Other

## 2020-04-19 ENCOUNTER — Telehealth: Payer: Self-pay | Admitting: Neurology

## 2020-04-19 ENCOUNTER — Other Ambulatory Visit: Payer: Self-pay

## 2020-04-19 ENCOUNTER — Ambulatory Visit: Payer: Medicare Other

## 2020-04-19 DIAGNOSIS — M6281 Muscle weakness (generalized): Secondary | ICD-10-CM

## 2020-04-19 DIAGNOSIS — R262 Difficulty in walking, not elsewhere classified: Secondary | ICD-10-CM

## 2020-04-19 DIAGNOSIS — R2681 Unsteadiness on feet: Secondary | ICD-10-CM

## 2020-04-19 DIAGNOSIS — R42 Dizziness and giddiness: Secondary | ICD-10-CM | POA: Diagnosis not present

## 2020-04-19 DIAGNOSIS — R2689 Other abnormalities of gait and mobility: Secondary | ICD-10-CM

## 2020-04-19 MED ORDER — CARBIDOPA-LEVODOPA ER 50-200 MG PO TBCR
1.0000 | EXTENDED_RELEASE_TABLET | Freq: Every day | ORAL | 0 refills | Status: DC
Start: 2020-04-19 — End: 2020-07-11

## 2020-04-19 NOTE — Therapy (Signed)
[facility] [address] [place], [place], [address] Phone: [phone]   Fax:  [phone]  Physical Therapy Treatment/Re-Certification  Patient Details  Name: Carolyn Figueroa MRN: [id number] Date of Birth: Aug 28, 1945 Referring Provider (PT): Fenton Foy, NP (Referring). Followed by Alonza Bogus, DO for PD   Encounter Date: 04/19/2020   PT End of Session - 04/19/20 1653    Visit Number 8    Number of Visits 17    Date for PT Re-Evaluation 07/18/20   POC for 5 weeks, Cert for 90 days   Authorization Type Medicare (10th Visit PN)    Progress Note Due on Visit 10    PT Start Time 1653    PT Stop Time 1738    PT Time Calculation (min) 45 min    Equipment Utilized During Treatment Gait belt    Activity Tolerance Patient tolerated treatment well    Behavior During Therapy WFL for tasks assessed/performed           Past Medical History:  Diagnosis Date  . Colon polyp 05/2003  . Depression with anxiety   . HTN (hypertension)   . Insomnia   . Need for prophylactic vaccination and inoculation against influenza 01/14/2013  . Polycythemia   . 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 Thrombocytosis   . Tremor 11/16/2013  . Varicose vein   . Vitamin D deficiency   . Vulvar candidiasis   . Weight loss 11/16/2013    Past Surgical History:  Procedure Laterality Date  . NO PAST SURGERIES      There were no vitals filed for this visit.   Subjective Assessment - 04/19/20 1655    Subjective No falls to report. No new changes. Continues to complete her exercises. Still feels imbalanced and gets fatigued quickly.    Patient is accompained by: Family member   daughter   Patient Stated Goals Pt would like to work on improving balance and dizziness    Currently in Pain? No/denies              [facility] PT Assessment - 04/19/20 1659      Assessment   Medical Diagnosis Dizziness/PD    Referring Provider (PT) Fenton Foy, NP (Referring). Followed by Alonza Bogus, DO for PD      Observation/Other Assessments   Focus on Therapeutic Outcomes (FOTO)  40%    Other Surveys  Dizziness Handicap Inventory [facility])    Dizziness Handicap Inventory [facility])  58              [facility] Adult PT Treatment/Exercise - 04/19/20 1659      Transfers   Transfers Sit to Stand;Stand to Sit    Sit to Stand 6: Modified independent (Device/Increase time)    Stand to Sit 6: Modified independent (Device/Increase time)      Ambulation/Gait   Ambulation/Gait Yes    Ambulation/Gait Assistance 5: Supervision    Ambulation/Gait Assistance Details throughout therapy gym with activites    Assistive device None    Gait Pattern Step-through pattern;Decreased stride length;Right flexed knee in stance;Right foot flat;Left foot flat    Ambulation Surface Level;Indoor    Gait Comments increased shuffling noted with stepping over obstacle during session      Standardized Balance Assessment   Standardized Balance Assessment Timed Up and Go Test;Dynamic Gait Index   overall increased verbal cues required with completion of balance tests     Dynamic Gait Index   Level Surface Mild Impairment  Change in Gait Speed Normal    Gait with Horizontal Head Turns Normal    Gait with Vertical Head Turns Normal    Gait and Pivot Turn Normal    Step Over Obstacle Moderate Impairment    Step Around Obstacles Mild Impairment    Steps Mild Impairment    Total Score 19      Timed Up and Go Test   TUG Normal TUG    Normal TUG (seconds) 12.75      Exercises   Exercises Knee/Hip      Knee/Hip Exercises: Aerobic   Other Aerobic Completed SciFit with BUE/BLE on Level 2.0 x 5 mintues for improved strength/activity tolerance and reciprocal movement. Patient require rest break of 30 seconds, half way through. but able to resume quickly.              PT Education - 04/19/20 1700    Education Details educated on progress toward LTGs; updated  POC. Focus on Parkinsons Symptoms and Dizziness going forward with treatment    Person(s) Educated Patient;Child(ren)    Methods Explanation;Demonstration;Handout    Comprehension Verbalized understanding;Returned demonstration            PT Short Term Goals - 03/29/20 1739      PT SHORT TERM GOAL #1   Title Pt will be independent with initial HEP    Time 3    Period Weeks    Status New    Target Date 03/30/20      PT SHORT TERM GOAL #2   Title Pt will be able to improve 5x STS to <15 sec    Baseline 17.79 sec; 03/29/20 10.40 sec    Time 3    Period Weeks    Status Achieved    Target Date 03/30/20      PT SHORT TERM GOAL #3   Title Pt will have improved TUG score to </=15 sec    Baseline 16.12 sec; 03/29/20 14.28 sec    Time 3    Period Weeks    Status Achieved    Target Date 03/30/20      PT SHORT TERM GOAL #4   Title Pt will report subjective decrease in dizziness by 50%    Baseline rates 4/5 dizziness at most with VORx1; 03/29/20 3/5 dizziness rating    Time 3    Period Weeks    Status Partially Met    Target Date 03/30/20             PT Long Term Goals - 04/19/20 1707      PT LONG TERM GOAL #1   Title Pt will be independent with final HEP    Baseline not completing as regularly 3x/week    Time 6    Period Weeks    Status Partially Met      PT LONG TERM GOAL #2   Title Pt will have improved DHI to </=30, and FOTO to 59%    Baseline 04/19/20: DHI 58, 40% FOTO    Time 6    Period Weeks    Status Not Met      PT LONG TERM GOAL #3   Title Pt will have improved TUG score to </=12 sec to demo decreased fall risk    Baseline 12.75 secs    Time 6    Period Weeks    Status Not Met      PT LONG TERM GOAL #4   Title Pt will have improved DGI to >/=21/24  to demo decreased fall risk    Baseline 19/24; 19/24 on 1/25    Time 6    Period Weeks    Status Not Met           Updated Short Term Goals:   PT Short Term Goals - 04/19/20 1951      PT SHORT TERM  GOAL #1   Title Patient will report improved compliance with HEP completing at >/= 5 days/week for improved self management of dizziness    Baseline 3x/week    Time 3    Period Weeks    Status New    Target Date 05/10/20              Updated Long Term Goals:     PT Long Term Goals - 04/19/20 1952      PT LONG TERM GOAL #1   Title Pt will be independent with final vestibular/balance HEP (All LTGs Due: 05/24/20)    Baseline continue to progress HEP    Time 5    Period Weeks    Status Revised    Target Date 05/24/20      PT LONG TERM GOAL #2   Title Pt will have improved DHI to </= 40 to demonstrate improved symptoms and QoL    Baseline DHI: 58    Time 5    Period Weeks    Status Revised      PT LONG TERM GOAL #3   Title Pt will have improved TUG score to </=12 sec to demo decreased fall risk    Baseline 12.75 secs    Time 5    Period Weeks    Status On-going      PT LONG TERM GOAL #4   Title Pt will have improved DGI to >/=21/24 to demo decreased fall risk    Baseline 19/24; 19/24 on 1/25    Time 5    Period Weeks    Status On-going               Plan - 04/19/20 1943    Clinical Impression Statement Today's skilled PT session focused on assesment of patient's progress toward all LTGs. Patient able to partially meet LTG #1. Patient demonstrating progress toward LTG with ability to complete TUG in 12.75 secs. Patient still at increased fall risk with completion of DGI scoring 19/24. Patient continues to demonstrate increased dizziness/motion senstivity, increased fatigue, and impaired functional mobility and balance due to Parkinson's Diagnosis. Patient continues to slowly progress toward goals, and will benefit from skilled PT services to address remaining impairments. PT stating need to focus on PD symptoms as well to further improve functional moblity, as well as continue to focus on dizziness/imbalance.    Personal Factors and Comorbidities Age;Comorbidity  1;Comorbidity 2;Past/Current Experience    Comorbidities post-COVID, Parkinson's disease,severe cervical spinal stenosis (declined surgery), depression, HTN, polycythemia    Examination-Activity Limitations Locomotion Level;Caring for Others;Stand    Examination-Participation Restrictions Driving;Community Activity;Cleaning    Stability/Clinical Decision Making Evolving/Moderate complexity    Rehab Potential Good    PT Frequency 2x / week    PT Duration 4 weeks   plus 1x/week for 1 week prior   PT Treatment/Interventions Aquatic Therapy;Cryotherapy;Electrical Stimulation;Moist Heat;DME Instruction;Gait training;Stair training;Functional mobility training;Therapeutic activities;Therapeutic exercise;Balance training;Neuromuscular re-education;Cognitive remediation;Orthotic Fit/Training;Patient/family education;Manual techniques;Passive range of motion;Dry needling;Energy conservation;Vestibular    PT Next Visit Plan Sci fit warm up. Continue to progress vestibular, balance, dynamic gait and strengthening exercises as tolerated. Pt has been tolerating x2 in   seated.    PT Home Exercise Plan Access Code: [facility]    Consulted and Agree with Plan of Care Patient    Family Member Consulted Daughter           Patient will benefit from skilled therapeutic intervention in order to improve the following deficits and impairments:  Abnormal gait,Decreased balance,Decreased endurance,Difficulty walking,Dizziness,Decreased activity tolerance,Decreased strength  Visit Diagnosis: Other abnormalities of gait and mobility  Difficulty in walking, not elsewhere classified  Unsteadiness on feet  Muscle weakness (generalized)  Dizziness and giddiness     Problem List Patient Active Problem List   Diagnosis Date Noted  . Dizziness 02/29/2020  . 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 History of COVID-19 02/29/2020  . Anemia due to antineoplastic chemotherapy  12/18/2019  . Preventive measure 12/23/2018  . Needs flu shot 12/03/2017  . Risk for falls 11/30/2015  . Essential hypertension 07/19/2015  . Parkinson disease ([place]) 07/19/2015  . Weight loss 11/16/2013  . Tremor 11/16/2013  . Elevated BUN 08/20/2013  . Leg pain 08/20/2013  . Need for prophylactic vaccination and inoculation against influenza 01/14/2013  . Insomnia 09/23/2012  . Low bone mass 09/23/2012  . Mixed incontinence 09/23/2012  . Vitamin D deficiency 09/23/2012  . Polycythemia vera ([place]) 08/15/2011  . Depression with anxiety   . Polycythemia   . Thrombocytosis     Jones Bales, PT, DPT 04/19/2020, 7:50 PM  [facility] [address] [place], [place], [address] Phone: [phone]   Fax:  [phone]  Name: Carolyn Figueroa MRN: [id number] Date of Birth: 05-02-1945

## 2020-04-19 NOTE — Telephone Encounter (Signed)
Rx(s) sent to pharmacy electronically.  

## 2020-04-20 ENCOUNTER — Telehealth: Payer: Self-pay

## 2020-04-20 NOTE — Telephone Encounter (Signed)
Dr. Carles Collet, Cipriano Bunker is currently being treated by Physical Therapy for Dizziness/Fatigue (post COVID). Due to patient's Parkinson's diagnosis and it's impact on mobility, I believe the patient would benefit if we also focus treatment on Parkinson's symptoms as well for improved functional mobility. If possible would you send a new referral including PD to allow for treatment?    If you agree, please place an order in [facility]que in [facility] or fax the order to [phone]. Thank you, Guillermina City, PT, [facility] [address] [place], [place]  [address] Phone:  [phone] Fax:  [phone]

## 2020-04-20 NOTE — Progress Notes (Signed)
Assessment/Plan:   1.  Parkinsons Disease  -Continue carbidopa/levodopa 25/100 immediate release, 2 tablets at 7 AM/11 AM/4 PM.  Told her to to try an extra 1 tablet of levodopa when she feels episode of weakness.  However, I wonder if the weakness is from the spinal stenosis.    -Continue carbidopa/levodopa levodopa 50/200 at bedtime  -Physical therapy order sent to neuro rehab.  -discussed hiring home trainer  2.  Severe cervical spinal stenosis  -Neurosurgery recommended surgery, but was declined by the patient.  3.  RBD  -Doing well clinically from that respect  4.  Post Covid loss of smell/taste/dizziness  -I really do not think that there is anything further that we can clinically do.  Patient and daughter have been understandably frustrated with post Covid symptoms of loss in smell and taste and vertigo.  They have scheduled appointment with [facility] neurology.      Subjective:   Carolyn Figueroa was seen today in follow up for Parkinsons disease.  My previous records were reviewed prior to todays visit as well as outside records available to me. Pt with daughter who supplements the history.  Patient's daughter last visit and wanted to decrease the patient's levodopa, although I was not sure that that was the source of her dizziness, since the dizziness did not correlate with orthostatic hypotension, and since it came on after she had Covid.  Nonetheless, we decreased her medication during the day and her dizziness did not get better and she clinically did worse.  Her daughter ended up calling stating those things and we changed her back to the immediate release tablet.  When the patient was worse, she also had a urinary tract infection.  She is still having a lot of dizziness and is in PT for it. She is not able to exercise.  When asking about hallucinations, she states that she will sometimes awaken and see her husband.  Nothing in daytime.  She is yelling in her sleep but  doesn't fall out of the bed.  She is sleeping good - "sleep is no problem."  C/o weakness in arms/legs.  Not sure if it is fluctuating in day with med.  Trouble with feeling of heaviness but some trouble grasping things.  Seems to come and go  Current prescribed movement disorder medications: Carbidopa/levodopa 25/100 IR, 2 tablets at 7 AM/11 AM/4 PM  Carbidopa/levodopa 50/200 at bedtime    PREVIOUS MEDICATIONS: Carbidopa/levodopa 25/100 CR (did not make a difference in her dizziness, so we changed back to the immediate release, which worked better clinically)  ALLERGIES:  No Known Allergies  CURRENT MEDICATIONS:  Outpatient Encounter Medications as of 04/22/2020  Medication Sig  . aspirin 81 MG tablet Take 81 mg by mouth daily.  . carbidopa-levodopa (SINEMET CR) 50-200 MG tablet Take 1 tablet by mouth at bedtime.  . carbidopa-levodopa (SINEMET IR) 25-100 MG tablet Take 2 tablets by mouth 3 (three) times daily.  . hydroxyurea (HYDREA) 500 MG capsule TAKE 2 CAPSULES BY MOUTH ON MONDAY AND FRIDAYS AND 1 CAPSULE BY MOUTH the rest of the week  . meclizine (ANTIVERT) 12.5 MG tablet Take 12.5 mg by mouth 3 (three) times daily as needed for dizziness.  . Multiple Vitamin (MULTIVITAMIN WITH MINERALS) TABS tablet Take 1 tablet by mouth daily.  . sertraline (ZOLOFT) 50 MG tablet Take 0.5 tablets by mouth 2 (two) times daily.  Marland Kitchen tolterodine (DETROL LA) 2 MG 24 hr capsule Take 2 mg by mouth daily.  No facility-administered encounter medications on file as of 04/22/2020.    Objective:   PHYSICAL EXAMINATION:    VITALS:   Vitals:   04/22/20 0907  BP: (!) 146/96  Pulse: 87  SpO2: 99%  Weight: 122 lb (55.3 kg)  Height: 5\' 1"  (1.549 m)    GEN:  The patient appears stated age and is in NAD. HEENT:  Normocephalic, atraumatic.  The mucous membranes are moist.   Neurological examination:  Orientation: The patient is alert and oriented x3. Cranial nerves: There is good facial symmetry with  mild facial hypomimia. The speech is fluent and clear. Soft palate rises symmetrically and there is no tongue deviation. Hearing is intact to conversational tone. Sensation: Sensation is intact to light touch throughout Motor: Strength is at least antigravity x4.  Movement examination: Tone: There is mild increased tone in the RUE Abnormal movements: rare LUE rest tremor Coordination:  There is no decremation with RAM's, with any form of RAMS, including alternating supination and pronation of the forearm, hand opening and closing, finger taps, heel taps and toe taps. Gait and Station: The patient has no difficulty arising out of a deep-seated chair without the use of the hands. The patient's stride length is good but purposeful.    I have reviewed and interpreted the following labs independently    Chemistry      Component Value Date/Time   NA 139 11/16/2013 1404   K 4.0 11/16/2013 1404   CL 102 07/30/2012 1419   CO2 28 11/16/2013 1404   BUN 30.0 (H) 11/16/2013 1404   CREATININE 0.9 11/16/2013 1404      Component Value Date/Time   CALCIUM 9.1 11/16/2013 1404   ALKPHOS 49 11/16/2013 1404   AST 15 11/16/2013 1404   ALT <6 11/16/2013 1404   BILITOT 1.45 (H) 11/16/2013 1404       Lab Results  Component Value Date   WBC 4.1 12/18/2019   HGB 11.7 (L) 12/18/2019   HCT 36.2 12/18/2019   MCV 105.8 (H) 12/18/2019   PLT 304 12/18/2019    Lab Results  Component Value Date   TSH 2.86 12/23/2018     Total time spent on today's visit was 30 minutes, including both face-to-face time and nonface-to-face time.  Time included that spent on review of records (prior notes available to me/labs/imaging if pertinent), discussing treatment and goals, answering patient's questions and coordinating care.  Cc:  [name], MD

## 2020-04-21 ENCOUNTER — Other Ambulatory Visit: Payer: Self-pay

## 2020-04-21 ENCOUNTER — Ambulatory Visit: Payer: Medicare Other

## 2020-04-21 DIAGNOSIS — R262 Difficulty in walking, not elsewhere classified: Secondary | ICD-10-CM

## 2020-04-21 DIAGNOSIS — M6281 Muscle weakness (generalized): Secondary | ICD-10-CM

## 2020-04-21 DIAGNOSIS — R42 Dizziness and giddiness: Secondary | ICD-10-CM | POA: Diagnosis not present

## 2020-04-21 DIAGNOSIS — R2681 Unsteadiness on feet: Secondary | ICD-10-CM

## 2020-04-21 DIAGNOSIS — R2689 Other abnormalities of gait and mobility: Secondary | ICD-10-CM

## 2020-04-21 NOTE — Therapy (Signed)
[facility] [address] [place], [place], [address] Phone: [phone]   Fax:  [phone]  Physical Therapy Treatment  Patient Details  Name: Carolyn Figueroa MRN: [id number] Date of Birth: 03-Jan-1946 Referring Provider (PT): Fenton Foy, NP (Referring). Followed by Alonza Bogus, DO for PD   Encounter Date: 04/21/2020   PT End of Session - 04/21/20 1652    Visit Number 9    Number of Visits 17    Date for PT Re-Evaluation 07/18/20   POC for 5 weeks, Cert for 90 days   Authorization Type Medicare (10th Visit PN)    Progress Note Due on Visit 10    PT Start Time [id number]    PT Stop Time 1732    PT Time Calculation (min) 42 min    Equipment Utilized During Treatment Gait belt    Activity Tolerance Patient tolerated treatment well    Behavior During Therapy WFL for tasks assessed/performed           Past Medical History:  Diagnosis Date  . Colon polyp 05/2003  . Depression with anxiety   . HTN (hypertension)   . Insomnia   . Need for prophylactic vaccination and inoculation against influenza 01/14/2013  . Polycythemia   . 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 Thrombocytosis   . Tremor 11/16/2013  . Varicose vein   . Vitamin D deficiency   . Vulvar candidiasis   . Weight loss 11/16/2013    Past Surgical History:  Procedure Laterality Date  . NO PAST SURGERIES      There were no vitals filed for this visit.   Subjective Assessment - 04/21/20 1652    Subjective Patient reports still having dizziness with exercises. No falls to report or new changes since last visit.    Patient is accompained by: Family member   daughter   Patient Stated Goals Pt would like to work on improving balance and dizziness    Currently in Pain? Yes    Pain Score 2     Pain Location Back    Pain Orientation Lower    Pain Descriptors / Indicators Aching    Pain Type Chronic pain              [facility] Adult PT  Treatment/Exercise - 04/21/20 0001      Ambulation/Gait   Ambulation/Gait Yes    Ambulation/Gait Assistance 5: Supervision    Ambulation/Gait Assistance Details throughout therapy gym with activites    Assistive device None    Gait Pattern Step-through pattern;Decreased stride length;Right flexed knee in stance;Right foot flat;Left foot flat    Ambulation Surface Level;Indoor      High Level Balance   High Level Balance Activities Negotiating over obstacles;Head turns    High Level Balance Comments At countertop: completed working toward stepping over orange hurdles forwards, completed x 4 laps down and back. Intermittent UE support required for proper completion. Completed ambulation with head turns, completed horizontal/vertical head turns 1 x 115 ft each direction'. mild increase in dizziness.      Exercises   Exercises Knee/Hip      Knee/Hip Exercises: Aerobic   Other Aerobic Completed SciFit only using BLE on Level 2.0 x 5 mintues for improved strength/activity tolerance and reciprocal movement.           Vestibular Treatment/Exercise - 04/21/20 0001      Vestibular Treatment/Exercise   Vestibular Treatment Provided Gaze    Habituation Exercises 180 degree Turns  180 degree Turns   Number of Reps  8    Symptom Description  completed ambulation with horizontal head turns, completed to R/L x 8 reps. intermittent rest breaks required due to increased symptoms.      X1 Viewing Horizontal   Foot Position seated    Reps 3    Comments 45 secs x2, 60 secs x 1; verbal cues for proper completion. mild dizziness.      X1 Viewing Vertical   Foot Position seated    Reps 2    Comments 45 secs x1; 60 secs x 1. no significant increase in symptoms      Eye/Head Exercise Horizontal   Foot Position standing feet apart    Reps 2    Comments x 60 seconds completed horizontal head turns looking to target (playing card) slowly progressed speed with each rep.      Eye/Head Exercise  Vertical   Foot Position standing feet apart    Reps 1    Comments x 60 seconds, increased symptoms after completion.                   PT Short Term Goals - 04/19/20 1951      PT SHORT TERM GOAL #1   Title Patient will report improved compliance with HEP completing at >/= 5 days/week for improved self management of dizziness    Baseline 3x/week    Time 3    Period Weeks    Status New    Target Date 05/10/20             PT Long Term Goals - 04/19/20 1952      PT LONG TERM GOAL #1   Title Pt will be independent with final vestibular/balance HEP (All LTGs Due: 05/24/20)    Baseline continue to progress HEP    Time 5    Period Weeks    Status Revised    Target Date 05/24/20      PT LONG TERM GOAL #2   Title Pt will have improved DHI to </= 40 to demonstrate improved symptoms and QoL    Baseline DHI: 58    Time 5    Period Weeks    Status Revised      PT LONG TERM GOAL #3   Title Pt will have improved TUG score to </=12 sec to demo decreased fall risk    Baseline 12.75 secs    Time 5    Period Weeks    Status On-going      PT LONG TERM GOAL #4   Title Pt will have improved DGI to >/=21/24 to demo decreased fall risk    Baseline 19/24; 19/24 on 1/25    Time 5    Period Weeks    Status On-going                 Plan - 04/21/20 1735    Clinical Impression Statement Today's skilled PT session focused on continued activites incorportating full body turns and head turns. Patient continue to have increased symptoms with horizontal/vertical head turns. Initiated 180 standing turns, with increased balance challenge and increased dizziness. Will continue to progress toward all unmet goals.    Personal Factors and Comorbidities Age;Comorbidity 1;Comorbidity 2;Past/Current Experience    Comorbidities post-COVID, Parkinson's disease,severe cervical spinal stenosis (declined surgery), depression, HTN, polycythemia    Examination-Activity Limitations Locomotion  Level;Caring for Others;Stand    Examination-Participation Restrictions Driving;Community Activity;Cleaning    Stability/Clinical Decision Making Evolving/Moderate complexity  Rehab Potential Good    PT Frequency 2x / week    PT Duration 4 weeks   plus 1x/week for 1 week prior   PT Treatment/Interventions Aquatic Therapy;Cryotherapy;Electrical Stimulation;Moist Heat;DME Instruction;Gait training;Stair training;Functional mobility training;Therapeutic activities;Therapeutic exercise;Balance training;Neuromuscular re-education;Cognitive remediation;Orthotic Fit/Training;Patient/family education;Manual techniques;Passive range of motion;Dry needling;Energy conservation;Vestibular    PT Next Visit Plan Complete SciFit warm up. Continue to progress vestibular, balance, dynamic gait and strengthening exercises as tolerated. Pt has been tolerating x2 in seated.    PT Home Exercise Plan Access Code: [facility]    Consulted and Agree with Plan of Care Patient    Family Member Consulted Daughter           Patient will benefit from skilled therapeutic intervention in order to improve the following deficits and impairments:  Abnormal gait,Decreased balance,Decreased endurance,Difficulty walking,Dizziness,Decreased activity tolerance,Decreased strength  Visit Diagnosis: Other abnormalities of gait and mobility  Difficulty in walking, not elsewhere classified  Unsteadiness on feet  Muscle weakness (generalized)  Dizziness and giddiness     Problem List Patient Active Problem List   Diagnosis Date Noted  . Dizziness 02/29/2020  . 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 History of COVID-19 02/29/2020  . Anemia due to antineoplastic chemotherapy 12/18/2019  . Preventive measure 12/23/2018  . Needs flu shot 12/03/2017  . Risk for falls 11/30/2015  . Essential hypertension 07/19/2015  . Parkinson disease ([place]) 07/19/2015  . Weight loss 11/16/2013  .  Tremor 11/16/2013  . Elevated BUN 08/20/2013  . Leg pain 08/20/2013  . Need for prophylactic vaccination and inoculation against influenza 01/14/2013  . Insomnia 09/23/2012  . Low bone mass 09/23/2012  . Mixed incontinence 09/23/2012  . Vitamin D deficiency 09/23/2012  . Polycythemia vera ([place]) 08/15/2011  . Depression with anxiety   . Polycythemia   . Thrombocytosis     Jones Bales, PT, DPT 04/21/2020, 5:39 PM  [facility] [address] [place], [place], [address] Phone: [phone]   Fax:  [phone]  Name: Carolyn Figueroa MRN: [id number] Date of Birth: 1945/05/10

## 2020-04-22 ENCOUNTER — Ambulatory Visit (INDEPENDENT_AMBULATORY_CARE_PROVIDER_SITE_OTHER): Payer: Medicare Other | Admitting: Neurology

## 2020-04-22 ENCOUNTER — Encounter: Payer: Self-pay | Admitting: Neurology

## 2020-04-22 VITALS — BP 146/96 | HR 87 | Ht 61.0 in | Wt 122.0 lb

## 2020-04-22 DIAGNOSIS — G20 Parkinson's disease: Secondary | ICD-10-CM | POA: Diagnosis not present

## 2020-04-22 NOTE — Patient Instructions (Signed)
One of the personal trainers that will come out to the home is [employer].  Her number is [phone].  The physicians and staff at [facility] are committed to providing excellent care. You may receive a survey requesting feedback about your experience at our office. We strive to receive "very good" responses to the survey questions. If you feel that your experience would prevent you from giving the office a "very good " response, please contact our office to try to remedy the situation. We may be reached at [phone]. Thank you for taking the time out of your busy day to complete the survey.

## 2020-04-27 ENCOUNTER — Other Ambulatory Visit: Payer: Self-pay

## 2020-04-27 ENCOUNTER — Ambulatory Visit: Payer: Medicare Other | Attending: Nurse Practitioner

## 2020-04-27 DIAGNOSIS — D6481 Anemia due to antineoplastic chemotherapy: Secondary | ICD-10-CM | POA: Diagnosis present

## 2020-04-27 DIAGNOSIS — R262 Difficulty in walking, not elsewhere classified: Secondary | ICD-10-CM | POA: Insufficient documentation

## 2020-04-27 DIAGNOSIS — R42 Dizziness and giddiness: Secondary | ICD-10-CM | POA: Diagnosis not present

## 2020-04-27 DIAGNOSIS — R2681 Unsteadiness on feet: Secondary | ICD-10-CM | POA: Diagnosis present

## 2020-04-27 DIAGNOSIS — M6281 Muscle weakness (generalized): Secondary | ICD-10-CM | POA: Diagnosis present

## 2020-04-27 DIAGNOSIS — R2689 Other abnormalities of gait and mobility: Secondary | ICD-10-CM | POA: Diagnosis present

## 2020-04-27 DIAGNOSIS — T451X5A Adverse effect of antineoplastic and immunosuppressive drugs, initial encounter: Secondary | ICD-10-CM | POA: Diagnosis present

## 2020-04-27 NOTE — Therapy (Signed)
[facility] [address] [place], [place], [address] Phone: [phone]   Fax:  [phone]  Physical Therapy Treatment/10th Visit Progress Note  Patient Details  Name: Carolyn Figueroa MRN: [id number] Date of Birth: 01/05/1946 Referring Provider (PT): Fenton Foy, NP (Referring). Followed by Alonza Bogus, DO for PD  Physical Therapy Progress Note   Dates of Reporting Period: 03/07/2020 - 04/27/2020  See Note below for Objective Data and Assessment of Progress/Goals.  Thank you for the referral of this patient. Guillermina [place], PT, DPT   Encounter Date: 04/27/2020   PT End of Session - 04/27/20 1305    Visit Number 10    Number of Visits 17    Date for PT Re-Evaluation 07/18/20   POC for 5 weeks, Cert for 90 days   Authorization Type Medicare (10th Visit PN)    Progress Note Due on Visit 10    PT Start Time 1305    PT Stop Time 1350    PT Time Calculation (min) 45 min    Equipment Utilized During Treatment Gait belt    Activity Tolerance Patient tolerated treatment well    Behavior During Therapy WFL for tasks assessed/performed           Past Medical History:  Diagnosis Date  . Colon polyp 05/2003  . Depression with anxiety   . HTN (hypertension)   . Insomnia   . Need for prophylactic vaccination and inoculation against influenza 01/14/2013  . Polycythemia   . 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 Thrombocytosis   . Tremor 11/16/2013  . Varicose vein   . Vitamin D deficiency   . Vulvar candidiasis   . Weight loss 11/16/2013    Past Surgical History:  Procedure Laterality Date  . NO PAST SURGERIES      There were no vitals filed for this visit.   Subjective Assessment - 04/27/20 1311    Subjective Patient reports visit with Dr. Carles Collet went well. No falls. Reports had a little dizziness this morning but reports is happening less.    Patient is accompained by: Family member   daughter   Patient  Stated Goals Pt would like to work on improving balance and dizziness    Currently in Pain? No/denies             [facility] Adult PT Treatment/Exercise - 04/27/20 0001      Ambulation/Gait   Ambulation/Gait Yes    Ambulation/Gait Assistance 5: Supervision    Ambulation/Gait Assistance Details see high level balance section for details    Assistive device None    Gait Pattern Step-through pattern;Decreased stride length;Right flexed knee in stance;Right foot flat;Left foot flat    Ambulation Surface Level;Indoor      Timed Up and Go Test   TUG Normal TUG    Normal TUG (seconds) 10.95      High Level Balance   High Level Balance Activities Head turns;Backward walking    High Level Balance Comments Completed ambulation with horizontal/vertical head turns 2 x 50', no reports of dizziness today with completion. Completed backwards walking 4 x 50" with close supervision, one instance of imbalance requiring CGA from PT. Mild dizziness with backwards walking reported but resolved quickly.      Exercises   Exercises Knee/Hip      Knee/Hip Exercises: Aerobic   Other Aerobic Completed SciFit only using BUE/BLE on Level 2.0 x 5 mintues for improved strength/activity tolerance and reciprocal movement.  Vestibular Treatment/Exercise - 04/27/20 0001      Vestibular Treatment/Exercise   Vestibular Treatment Provided Habituation;Gaze    Habituation Exercises 180 degree Turns    Gaze Exercises X1 Viewing Horizontal;X1 Viewing Vertical      180 degree Turns   Number of Reps  8    Symptom Description  continued working on 180 deg turns, patient reports dizziness but with increased questioning by PT seems to be more dysequilibrium/balance      X1 Viewing Horizontal   Foot Position standing feet apart    Reps 2    Comments x 60 seconds. poor carryover noted with form.      X1 Viewing Vertical   Foot Position standing feet apart    Reps 2    Comments x 60 seconds.              Balance Exercises - 04/27/20 0001      Balance Exercises: Standing   Standing Eyes Closed Wide (BOA);Head turns;Foam/compliant surface;Limitations    Standing Eyes Closed Limitations completed horizontal/vertical head turns 2 x 10 reps with vision remvoed. increased sway noted with EC.             PT Education - 04/27/20 1359    Education Details Educated on TUG Time    Person(s) Educated Patient    Methods Explanation    Comprehension Verbalized understanding            PT Short Term Goals - 04/19/20 1951      PT SHORT TERM GOAL #1   Title Patient will report improved compliance with HEP completing at >/= 5 days/week for improved self management of dizziness    Baseline 3x/week    Time 3    Period Weeks    Status New    Target Date 05/10/20             PT Long Term Goals - 04/19/20 1952      PT LONG TERM GOAL #1   Title Pt will be independent with final vestibular/balance HEP (All LTGs Due: 05/24/20)    Baseline continue to progress HEP    Time 5    Period Weeks    Status Revised    Target Date 05/24/20      PT LONG TERM GOAL #2   Title Pt will have improved DHI to </= 40 to demonstrate improved symptoms and QoL    Baseline DHI: 58    Time 5    Period Weeks    Status Revised      PT LONG TERM GOAL #3   Title Pt will have improved TUG score to </=12 sec to demo decreased fall risk    Baseline 12.75 secs    Time 5    Period Weeks    Status On-going      PT LONG TERM GOAL #4   Title Pt will have improved DGI to >/=21/24 to demo decreased fall risk    Baseline 19/24; 19/24 on 1/25    Time 5    Period Weeks    Status On-going                 Plan - 04/27/20 1315    Clinical Impression Statement Reassess TUG for 10th Visit PN, patient demonstrating imrpoved with TUG today completing in 10.95 secs. Patient is demonstrating slow steady progress with PT services. Rest of session focused on continued habituation and gaze stabilization  activities as tolerated by patient, increased fatigue reported but  minimal dizziness. Patient verbalzies improvements in dizziness and feels as if she is getting better. Continued balance activities and dynamic balance with gait, requiring increased cues and CGA from PT. Patient will continue to benefit from skilled PT services, and will continue to progress toward all LTGs.    Personal Factors and Comorbidities Age;Comorbidity 1;Comorbidity 2;Past/Current Experience    Comorbidities post-COVID, Parkinson's disease,severe cervical spinal stenosis (declined surgery), depression, HTN, polycythemia    Examination-Activity Limitations Locomotion Level;Caring for Others;Stand    Examination-Participation Restrictions Driving;Community Activity;Cleaning    Stability/Clinical Decision Making Evolving/Moderate complexity    Rehab Potential Good    PT Frequency 2x / week    PT Duration 4 weeks   plus 1x/week for 1 week prior   PT Treatment/Interventions Aquatic Therapy;Cryotherapy;Electrical Stimulation;Moist Heat;DME Instruction;Gait training;Stair training;Functional mobility training;Therapeutic activities;Therapeutic exercise;Balance training;Neuromuscular re-education;Cognitive remediation;Orthotic Fit/Training;Patient/family education;Manual techniques;Passive range of motion;Dry needling;Energy conservation;Vestibular    PT Next Visit Plan Complete SciFit warm up. Continue to progress vestibular, balance, dynamic gait and strengthening exercises as tolerated. Pt has been tolerating x2 in seated.    PT Home Exercise Plan Access Code: [facility]    Consulted and Agree with Plan of Care Patient    Family Member Consulted Daughter           Patient will benefit from skilled therapeutic intervention in order to improve the following deficits and impairments:  Abnormal gait,Decreased balance,Decreased endurance,Difficulty walking,Dizziness,Decreased activity tolerance,Decreased strength  Visit  Diagnosis: Dizziness and giddiness  Other abnormalities of gait and mobility  Unsteadiness on feet  Muscle weakness (generalized)  Difficulty in walking, not elsewhere classified     Problem List Patient Active Problem List   Diagnosis Date Noted  . Dizziness 02/29/2020  . 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 History of COVID-19 02/29/2020  . Anemia due to antineoplastic chemotherapy 12/18/2019  . Preventive measure 12/23/2018  . Needs flu shot 12/03/2017  . Risk for falls 11/30/2015  . Essential hypertension 07/19/2015  . Parkinson disease ([place]) 07/19/2015  . Weight loss 11/16/2013  . Tremor 11/16/2013  . Elevated BUN 08/20/2013  . Leg pain 08/20/2013  . Need for prophylactic vaccination and inoculation against influenza 01/14/2013  . Insomnia 09/23/2012  . Low bone mass 09/23/2012  . Mixed incontinence 09/23/2012  . Vitamin D deficiency 09/23/2012  . Polycythemia vera ([place]) 08/15/2011  . Depression with anxiety   . Polycythemia   . Thrombocytosis     Jones Bales, PT, DPT 04/27/2020, 1:59 PM  [facility] [address] [place], [place], [address] Phone: [phone]   Fax:  [phone]  Name: Carolyn Figueroa MRN: [id number] Date of Birth: April 30, 1945

## 2020-04-29 ENCOUNTER — Other Ambulatory Visit: Payer: Self-pay

## 2020-04-29 ENCOUNTER — Ambulatory Visit: Payer: Medicare Other

## 2020-04-29 DIAGNOSIS — M6281 Muscle weakness (generalized): Secondary | ICD-10-CM

## 2020-04-29 DIAGNOSIS — R42 Dizziness and giddiness: Secondary | ICD-10-CM

## 2020-04-29 DIAGNOSIS — R262 Difficulty in walking, not elsewhere classified: Secondary | ICD-10-CM

## 2020-04-29 DIAGNOSIS — R2689 Other abnormalities of gait and mobility: Secondary | ICD-10-CM

## 2020-04-29 DIAGNOSIS — R2681 Unsteadiness on feet: Secondary | ICD-10-CM

## 2020-04-29 NOTE — Therapy (Signed)
[facility] [address] [place], [place], [address] Phone: [phone]   Fax:  [phone]  Physical Therapy Treatment  Patient Details  Name: Carolyn Figueroa MRN: [id number] Date of Birth: [birth year] Referring Provider (PT): Ivonne AndrewNichols, Tonya S, NP (Referring). Followed by Kerin Salenebecca Tat, DO for PD   Encounter Date: 04/29/2020   PT End of Session - 04/29/20 1530    Visit Number 11    Number of Visits 17    Date for PT Re-Evaluation 07/18/20   POC for 5 weeks, Cert for 90 days   Authorization Type Medicare (10th Visit PN)    Progress Note Due on Visit 10    PT Start Time 1529    PT Stop Time 1613    PT Time Calculation (min) 44 min    Equipment Utilized During Treatment Gait belt    Activity Tolerance Patient tolerated treatment well    Behavior During Therapy WFL for tasks assessed/performed           Past Medical History:  Diagnosis Date  . Colon polyp 05/2003  . Depression with anxiety   . HTN (hypertension)   . Insomnia   . Need for prophylactic vaccination and inoculation against influenza 01/14/2013  . Polycythemia   . 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 Thrombocytosis   . Tremor 11/16/2013  . Varicose vein   . Vitamin D deficiency   . Vulvar candidiasis   . Weight loss 11/16/2013    Past Surgical History:  Procedure Laterality Date  . NO PAST SURGERIES      There were no vitals filed for this visit.   Subjective Assessment - 04/29/20 1531    Subjective Patient reports general feeling of fatigue and tiredness. No falls.    Patient is accompained by: Family member   daughter   Patient Stated Goals Pt would like to work on improving balance and dizziness    Currently in Pain? Yes    Pain Score 5     Pain Location Back    Pain Orientation Lower    Pain Descriptors / Indicators Aching    Pain Type Chronic pain                   Vestibular Assessment - 04/29/20 0001       Orthostatics   BP supine (x 5 minutes) 145/95    HR supine (x 5 minutes) 86    BP sitting 109/76   increased dizziness with supine > sit   HR sitting 88    BP standing (after 1 minute) 140/89    HR standing (after 1 minute) 86             [facility] Adult PT Treatment/Exercise - 04/29/20 0001      Ambulation/Gait   Ambulation/Gait Yes    Ambulation/Gait Assistance 5: Supervision    Ambulation/Gait Assistance Details see high level balance    Assistive device None    Gait Pattern Step-through pattern;Decreased stride length;Right flexed knee in stance;Right foot flat;Left foot flat    Ambulation Surface Level;Indoor      High Level Balance   High Level Balance Activities Head turns    High Level Balance Comments Completed ambulation 2 x 100 ft with horizontal head turns, 1 x 100 ft with vertical head turns. One instance of increased dizziness with horizontal.      Exercises   Exercises Knee/Hip      Knee/Hip Exercises: Aerobic   Other Aerobic Completed SciFit  using BUE/BLE on Level 1.5 x 3 minutes for improved strengthening/reciprocal motion.           Vestibular Treatment/Exercise - 04/29/20 0001      Vestibular Treatment/Exercise   Vestibular Treatment Provided Habituation;Gaze    Habituation Exercises Standing Horizontal Head Turns;180 degree Turns    Gaze Exercises X2 Viewing Horizontal      Standing Horizontal Head Turns   Number of Reps  3    Symptom Description  Completed standing in corner with various cognition cards completed horizontal head turns x 4 lines with reading off word. Progressed to completing with stroop task cards, completed x 4 lines with reading off the word, then x 4 lines with reading off colored font of word. Increased cogitive challenge noted with requiring verbal cues for approrpiate response but patient able to correct mistakes with cues.      180 degree Turns   Number of Reps  6    Symptom Description  in // bars on blue mat completed 180 deg  turns, 1-2 instances of increased dizziness reported.      X2 Viewing Horizontal   Foot Position seated     Comments completed x 5 reps, PT having to provide assitance with sequencing on UE and head turn due to increased challenge.              Balance Exercises - 04/29/20 0001      Balance Exercises: Standing   Standing Eyes Opened Wide (BOA);Head turns;Limitations;Foam/compliant surface    Standing Eyes Opened Limitations completed 2 x 10 reps of horizontal/vertical head turns. increased sway and intermittent CGA required    Standing Eyes Closed Wide (BOA);Foam/compliant surface;3 reps;20 secs    Standing Eyes Closed Limitations completed standing with wide BOS and eys closed 3 x 20 seconds. increased sway noted with vision removed.             PT Education - 04/29/20 1620    Education Details Educated on [employer]) Educated Patient;Child(ren)    Methods Explanation    Comprehension Verbalized understanding            PT Short Term Goals - 04/19/20 1951      PT SHORT TERM GOAL #1   Title Patient will report improved compliance with HEP completing at >/= 5 days/week for improved self management of dizziness    Baseline 3x/week    Time 3    Period Weeks    Status New    Target Date 05/10/20             PT Long Term Goals - 04/19/20 1952      PT LONG TERM GOAL #1   Title Pt will be independent with final vestibular/balance HEP (All LTGs Due: 05/24/20)    Baseline continue to progress HEP    Time 5    Period Weeks    Status Revised    Target Date 05/24/20      PT LONG TERM GOAL #2   Title Pt will have improved DHI to </= 40 to demonstrate improved symptoms and QoL    Baseline DHI: 58    Time 5    Period Weeks    Status Revised      PT LONG TERM GOAL #3   Title Pt will have improved TUG score to </=12 sec to demo decreased fall risk    Baseline 12.75 secs    Time 5    Period Weeks    Status  On-going      PT LONG TERM GOAL #4   Title  Pt will have improved DGI to >/=21/24 to demo decreased fall risk    Baseline 19/24; 19/24 on 1/25    Time 5    Period Weeks    Status On-going                 Plan - 04/29/20 1621    Clinical Impression Statement With orthostatic assessment patient had significant BP drop from supine > sit, but did return to normal with sit > stand. Paiten reporting significant lightheadness/dizziness with the position change. Continued session focused on habituation and gaze stabilziation as tolerated by patient. Increased fatigue noted today. Patient having increased challenge with stroop task, as well as difficulty with sequencing VOR x 2 in seated. Will continue to progress toward all LTGs.    Personal Factors and Comorbidities Age;Comorbidity 1;Comorbidity 2;Past/Current Experience    Comorbidities post-COVID, Parkinson's disease,severe cervical spinal stenosis (declined surgery), depression, HTN, polycythemia    Examination-Activity Limitations Locomotion Level;Caring for Others;Stand    Examination-Participation Restrictions Driving;Community Activity;Cleaning    Stability/Clinical Decision Making Evolving/Moderate complexity    Rehab Potential Good    PT Frequency 2x / week    PT Duration 4 weeks   plus 1x/week for 1 week prior   PT Treatment/Interventions Aquatic Therapy;Cryotherapy;Electrical Stimulation;Moist Heat;DME Instruction;Gait training;Stair training;Functional mobility training;Therapeutic activities;Therapeutic exercise;Balance training;Neuromuscular re-education;Cognitive remediation;Orthotic Fit/Training;Patient/family education;Manual techniques;Passive range of motion;Dry needling;Energy conservation;Vestibular    PT Next Visit Plan Complete SciFit warm up. Continue to progress vestibular, balance, dynamic gait and strengthening exercises as tolerated. Continue activites with dual task/cognitive challenge    PT Home Exercise Plan Access Code: [facility]    Consulted and Agree with  Plan of Care Patient    Family Member Consulted Daughter           Patient will benefit from skilled therapeutic intervention in order to improve the following deficits and impairments:  Abnormal gait,Decreased balance,Decreased endurance,Difficulty walking,Dizziness,Decreased activity tolerance,Decreased strength  Visit Diagnosis: Dizziness and giddiness  Unsteadiness on feet  Other abnormalities of gait and mobility  Muscle weakness (generalized)  Difficulty in walking, not elsewhere classified     Problem List Patient Active Problem List   Diagnosis Date Noted  . Dizziness 02/29/2020  . 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 History of COVID-19 02/29/2020  . Anemia due to antineoplastic chemotherapy 12/18/2019  . Preventive measure 12/23/2018  . Needs flu shot 12/03/2017  . Risk for falls 11/30/2015  . Essential hypertension 07/19/2015  . Parkinson disease ([place]) 07/19/2015  . Weight loss 11/16/2013  . Tremor 11/16/2013  . Elevated BUN 08/20/2013  . Leg pain 08/20/2013  . Need for prophylactic vaccination and inoculation against influenza 01/14/2013  . Insomnia 09/23/2012  . Low bone mass 09/23/2012  . Mixed incontinence 09/23/2012  . Vitamin D deficiency 09/23/2012  . Polycythemia vera ([place]) 08/15/2011  . Depression with anxiety   . Polycythemia   . Thrombocytosis     Jones Bales, PT, DPT 04/29/2020, 4:24 PM  [facility] [address] [place], [place], [address] Phone: [phone]   Fax:  [phone]  Name: Carolyn Figueroa MRN: [id number] Date of Birth: 03-02-1946

## 2020-05-03 ENCOUNTER — Ambulatory Visit: Payer: Medicare Other

## 2020-05-03 ENCOUNTER — Other Ambulatory Visit: Payer: Self-pay

## 2020-05-03 DIAGNOSIS — R42 Dizziness and giddiness: Secondary | ICD-10-CM

## 2020-05-03 DIAGNOSIS — R2681 Unsteadiness on feet: Secondary | ICD-10-CM

## 2020-05-03 DIAGNOSIS — R2689 Other abnormalities of gait and mobility: Secondary | ICD-10-CM

## 2020-05-03 DIAGNOSIS — M6281 Muscle weakness (generalized): Secondary | ICD-10-CM

## 2020-05-03 DIAGNOSIS — R262 Difficulty in walking, not elsewhere classified: Secondary | ICD-10-CM

## 2020-05-03 NOTE — Therapy (Signed)
[facility] [address] [place], [place], [address] Phone: [phone]   Fax:  [phone]  Physical Therapy Treatment  Patient Details  Name: Carolyn Figueroa MRN: [id number] Date of Birth: 01/01/1946 Referring Provider (PT): Fenton Foy, NP (Referring). Followed by Alonza Bogus, DO for PD   Encounter Date: 05/03/2020   PT End of Session - 05/03/20 1710    Visit Number 12    Number of Visits 17    Date for PT Re-Evaluation 07/18/20   POC for 5 weeks, Cert for 90 days   Authorization Type Medicare (10th Visit PN)    Progress Note Due on Visit 10    PT Start Time 1701    PT Stop Time 1744    PT Time Calculation (min) 43 min    Equipment Utilized During Treatment Gait belt    Activity Tolerance Patient tolerated treatment well    Behavior During Therapy WFL for tasks assessed/performed           Past Medical History:  Diagnosis Date  . Colon polyp 05/2003  . Depression with anxiety   . HTN (hypertension)   . Insomnia   . Need for prophylactic vaccination and inoculation against influenza 01/14/2013  . Polycythemia   . 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 Thrombocytosis   . Tremor 11/16/2013  . Varicose vein   . Vitamin D deficiency   . Vulvar candidiasis   . Weight loss 11/16/2013    Past Surgical History:  Procedure Laterality Date  . NO PAST SURGERIES      There were no vitals filed for this visit.   Subjective Assessment - 05/03/20 1704    Subjective Patient reports that she is still feeling fatigued and decreased energy. No falls. Still reports it is worse in the morning.    Patient is accompained by: Family member   daughter   Patient Stated Goals Pt would like to work on improving balance and dizziness    Currently in Pain? No/denies              [facility] Adult PT Treatment/Exercise - 05/03/20 0001      Ambulation/Gait   Ambulation/Gait Yes    Ambulation/Gait Assistance 5:  Supervision    Ambulation/Gait Assistance Details with high level balance activities    Assistive device None    Gait Pattern Step-through pattern;Decreased stride length;Right flexed knee in stance;Right foot flat;Left foot flat    Ambulation Surface Level;Indoor      High Level Balance   High Level Balance Activities Head turns    High Level Balance Comments Completed ambulation with vertical head turns 4 x 50', one instance of imbalance requiring CGA. Completed horizontal head turns with ambulation, 4 x 50' with patient naming card number. Patient doing very well with horizontal head turns today during session.      Neuro Re-ed    Neuro Re-ed Details  Completed multidirectional stepping with sheet, completed x 4 reps through entire sheet. Patient able to complete correctly initially but with increased reps/fatigue, patient having more cognitive challenge requiring increased verbal cues.      Exercises   Exercises Knee/Hip      Knee/Hip Exercises: Aerobic   Other Aerobic Completed SciFit using BUE/BLE on Level 1.5 x 5 minutes for improved strengthening/reciprocal motion.           Vestibular Treatment/Exercise - 05/03/20 0001      Vestibular Treatment/Exercise   Vestibular Treatment Provided Gaze    Gaze  Exercises X1 Viewing Horizontal;X1 Viewing Vertical      X1 Viewing Horizontal   Foot Position standing feet apart, patterned background    Reps 2    Comments x 60 secs; increased symptoms (rated 5/10), short rest break to allow for resolution of symptoms      X1 Viewing Vertical   Foot Position standing feet apart, patterned background    Reps 2    Comments x 60 secs; mild symptoms (rated 4/10)                   PT Short Term Goals - 04/19/20 1951      PT SHORT TERM GOAL #1   Title Patient will report improved compliance with HEP completing at >/= 5 days/week for improved self management of dizziness    Baseline 3x/week    Time 3    Period Weeks    Status  New    Target Date 05/10/20             PT Long Term Goals - 04/19/20 1952      PT LONG TERM GOAL #1   Title Pt will be independent with final vestibular/balance HEP (All LTGs Due: 05/24/20)    Baseline continue to progress HEP    Time 5    Period Weeks    Status Revised    Target Date 05/24/20      PT LONG TERM GOAL #2   Title Pt will have improved DHI to </= 40 to demonstrate improved symptoms and QoL    Baseline DHI: 58    Time 5    Period Weeks    Status Revised      PT LONG TERM GOAL #3   Title Pt will have improved TUG score to </=12 sec to demo decreased fall risk    Baseline 12.75 secs    Time 5    Period Weeks    Status On-going      PT LONG TERM GOAL #4   Title Pt will have improved DGI to >/=21/24 to demo decreased fall risk    Baseline 19/24; 19/24 on 1/25    Time 5    Period Weeks    Status On-going                 Plan - 05/03/20 1710    Clinical Impression Statement Continued activities working on improved gaze stabilization and habituation exercises. Progressed VOR x 1 to patterned background with increased dizziness reported. Initiated multidirectional stepping with command following (via sheet) and increased challenge noted with cognitive task. Will continue to progress toward all LTGs.    Personal Factors and Comorbidities Age;Comorbidity 1;Comorbidity 2;Past/Current Experience    Comorbidities post-COVID, Parkinson's disease,severe cervical spinal stenosis (declined surgery), depression, HTN, polycythemia    Examination-Activity Limitations Locomotion Level;Caring for Others;Stand    Examination-Participation Restrictions Driving;Community Activity;Cleaning    Stability/Clinical Decision Making Evolving/Moderate complexity    Rehab Potential Good    PT Frequency 2x / week    PT Duration 4 weeks   plus 1x/week for 1 week prior   PT Treatment/Interventions Aquatic Therapy;Cryotherapy;Electrical Stimulation;Moist Heat;DME Instruction;Gait  training;Stair training;Functional mobility training;Therapeutic activities;Therapeutic exercise;Balance training;Neuromuscular re-education;Cognitive remediation;Orthotic Fit/Training;Patient/family education;Manual techniques;Passive range of motion;Dry needling;Energy conservation;Vestibular    PT Next Visit Plan Complete SciFit warm up. Continue to progress vestibular, balance, dynamic gait and strengthening exercises as tolerated. Continue activites with dual task/cognitive challenge    PT Home Exercise Plan Access Code: [place] and  Agree with Plan of Care Patient    Family Member Consulted Daughter           Patient will benefit from skilled therapeutic intervention in order to improve the following deficits and impairments:  Abnormal gait,Decreased balance,Decreased endurance,Difficulty walking,Dizziness,Decreased activity tolerance,Decreased strength  Visit Diagnosis: Dizziness and giddiness  Unsteadiness on feet  Other abnormalities of gait and mobility  Muscle weakness (generalized)  Difficulty in walking, not elsewhere classified     Problem List Patient Active Problem List   Diagnosis Date Noted  . Dizziness 02/29/2020  . 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 History of COVID-19 02/29/2020  . Anemia due to antineoplastic chemotherapy 12/18/2019  . Preventive measure 12/23/2018  . Needs flu shot 12/03/2017  . Risk for falls 11/30/2015  . Essential hypertension 07/19/2015  . Parkinson disease ([place]) 07/19/2015  . Weight loss 11/16/2013  . Tremor 11/16/2013  . Elevated BUN 08/20/2013  . Leg pain 08/20/2013  . Need for prophylactic vaccination and inoculation against influenza 01/14/2013  . Insomnia 09/23/2012  . Low bone mass 09/23/2012  . Mixed incontinence 09/23/2012  . Vitamin D deficiency 09/23/2012  . Polycythemia vera ([place]) 08/15/2011  . Depression with anxiety   . Polycythemia   . Thrombocytosis      Jones Bales, PT, DPT 05/03/2020, 5:48 PM  [facility] [address] [facility] [place], [place], [address] Phone: [phone]   Fax:  [phone]  Name: Anea Fodera MRN: [id number] Date of Birth: 05/06/1945

## 2020-05-06 ENCOUNTER — Ambulatory Visit: Payer: Medicare Other | Admitting: Physical Therapy

## 2020-05-06 ENCOUNTER — Other Ambulatory Visit: Payer: Self-pay

## 2020-05-06 ENCOUNTER — Encounter: Payer: Self-pay | Admitting: Physical Therapy

## 2020-05-06 DIAGNOSIS — R42 Dizziness and giddiness: Secondary | ICD-10-CM | POA: Diagnosis not present

## 2020-05-06 DIAGNOSIS — R2689 Other abnormalities of gait and mobility: Secondary | ICD-10-CM

## 2020-05-06 DIAGNOSIS — R2681 Unsteadiness on feet: Secondary | ICD-10-CM

## 2020-05-06 NOTE — Therapy (Signed)
[facility] [address] [place], [place], [address] Phone: [phone]   Fax:  [phone]  Physical Therapy Treatment  Patient Details  Name: Carolyn Figueroa MRN: [id number] Date of Birth: 1945/11/02 Referring Provider (PT): Fenton Foy, NP (Referring). Followed by Alonza Bogus, DO for PD   Encounter Date: 05/06/2020   PT End of Session - 05/06/20 1634    Visit Number 13    Number of Visits 17    Date for PT Re-Evaluation 07/18/20   POC for 5 weeks, Cert for 90 days   Authorization Type Medicare (10th Visit PN)    Progress Note Due on Visit 10    PT Start Time 1533    PT Stop Time 1614    PT Time Calculation (min) 41 min    Equipment Utilized During Treatment Gait belt    Activity Tolerance Patient tolerated treatment well    Behavior During Therapy WFL for tasks assessed/performed           Past Medical History:  Diagnosis Date  . Colon polyp 05/2003  . Depression with anxiety   . HTN (hypertension)   . Insomnia   . Need for prophylactic vaccination and inoculation against influenza 01/14/2013  . Polycythemia   . 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 Thrombocytosis   . Tremor 11/16/2013  . Varicose vein   . Vitamin D deficiency   . Vulvar candidiasis   . Weight loss 11/16/2013    Past Surgical History:  Procedure Laterality Date  . NO PAST SURGERIES      There were no vitals filed for this visit.   Subjective Assessment - 05/06/20 1536    Subjective Patient reports feeling more fatigued today. Had a lot of dizziness yerday.  Going up the stairs is hard - doing them everyday.    Patient is accompained by: Family member   daughter   Patient Stated Goals Pt would like to work on improving balance and dizziness    Currently in Pain? Yes    Pain Score 5     Pain Location Back    Pain Orientation Lower    Pain Descriptors / Indicators Aching    Pain Type Chronic pain    Aggravating Factors   nothing    Pain Relieving Factors aleve                             [facility] Adult PT Treatment/Exercise - 05/06/20 1542      Knee/Hip Exercises: Aerobic   Other Aerobic Completed SciFit using BUE/BLE on Level 1.5 x 5 minutes for improved strengthening/reciprocal motion. needed one rest break due to fatigue               Balance Exercises - 05/06/20 0001      Balance Exercises: Standing   Standing Eyes Closed Narrow base of support (BOS);Foam/compliant surface;3 reps;30 secs    Standing Eyes Closed Limitations on air ex, intermittent taps for balance on chair    SLS with Vectors Foam/compliant surface;Limitations    SLS with Vectors Limitations at bottom of staircase on blue air ex, 5 reps alternating step taps to 6" step, then performed same activity to 2nd step, pt reporting incr dizziness afterwards   Stepping Strategy Posterior;Anterior;Foam/compliant surface;10 reps;Limitations    Stepping Strategy Limitations standing on blue air ex, x10 reps each direction, cues for sequencing and foot clearance, progressing from UE support to no UE support,  min guard    Rockerboard Anterior/posterior;30 seconds;Limitations    Rockerboard Limitations weight shifting forwards and backwards x15 reps, keeping board steady 2 x 10 reps head turns - first rep with fingertip support, 2nd ith none with min A/using bars at times for balance    Other Standing Exercises standing on blue air ex with feet apart: x10 reps B diagonal lifts/chops, pt reporting 4/10 dizziness afterwards that subsided with rest               PT Short Term Goals - 04/19/20 1951      PT SHORT TERM GOAL #1   Title Patient will report improved compliance with HEP completing at >/= 5 days/week for improved self management of dizziness    Baseline 3x/week    Time 3    Period Weeks    Status New    Target Date 05/10/20             PT Long Term Goals - 04/19/20 1952      PT LONG TERM GOAL #1   Title  Pt will be independent with final vestibular/balance HEP (All LTGs Due: 05/24/20)    Baseline continue to progress HEP    Time 5    Period Weeks    Status Revised    Target Date 05/24/20      PT LONG TERM GOAL #2   Title Pt will have improved DHI to </= 40 to demonstrate improved symptoms and QoL    Baseline DHI: 58    Time 5    Period Weeks    Status Revised      PT LONG TERM GOAL #3   Title Pt will have improved TUG score to </=12 sec to demo decreased fall risk    Baseline 12.75 secs    Time 5    Period Weeks    Status On-going      PT LONG TERM GOAL #4   Title Pt will have improved DGI to >/=21/24 to demo decreased fall risk    Baseline 19/24; 19/24 on 1/25    Time 5    Period Weeks    Status On-going                 Plan - 05/06/20 1637    Clinical Impression Statement Today's skilled session focused on balance strategies on compliant surfaces. Pt tolerated session well, did report mild/moderate dizziness with diagonal head movements and when looking down for an extended period of time when performing SLS activities. Dizziness did subside quickly with standing/seated rest break. Will continue to progress towards LTGs.    Personal Factors and Comorbidities Age;Comorbidity 1;Comorbidity 2;Past/Current Experience    Comorbidities post-COVID, Parkinson's disease,severe cervical spinal stenosis (declined surgery), depression, HTN, polycythemia    Examination-Activity Limitations Locomotion Level;Caring for Others;Stand    Examination-Participation Restrictions Driving;Community Activity;Cleaning    Stability/Clinical Decision Making Evolving/Moderate complexity    Rehab Potential Good    PT Frequency 2x / week    PT Duration 4 weeks   plus 1x/week for 1 week prior   PT Treatment/Interventions Aquatic Therapy;Cryotherapy;Electrical Stimulation;Moist Heat;DME Instruction;Gait training;Stair training;Functional mobility training;Therapeutic activities;Therapeutic  exercise;Balance training;Neuromuscular re-education;Cognitive remediation;Orthotic Fit/Training;Patient/family education;Manual techniques;Passive range of motion;Dry needling;Energy conservation;Vestibular    PT Next Visit Plan Complete SciFit warm up. Continue to progress vestibular, balance, dynamic gait and strengthening exercises as tolerated. Continue activites with dual task/cognitive challenge    PT Home Exercise Plan Access Code: [facility]    Consulted and Agree with Plan of  Care Patient    Family Member Consulted Daughter           Patient will benefit from skilled therapeutic intervention in order to improve the following deficits and impairments:  Abnormal gait,Decreased balance,Decreased endurance,Difficulty walking,Dizziness,Decreased activity tolerance,Decreased strength  Visit Diagnosis: Dizziness and giddiness  Unsteadiness on feet  Other abnormalities of gait and mobility     Problem List Patient Active Problem List   Diagnosis Date Noted  . Dizziness 02/29/2020  . 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 History of COVID-19 02/29/2020  . Anemia due to antineoplastic chemotherapy 12/18/2019  . Preventive measure 12/23/2018  . Needs flu shot 12/03/2017  . Risk for falls 11/30/2015  . Essential hypertension 07/19/2015  . Parkinson disease ([place]) 07/19/2015  . Weight loss 11/16/2013  . Tremor 11/16/2013  . Elevated BUN 08/20/2013  . Leg pain 08/20/2013  . Need for prophylactic vaccination and inoculation against influenza 01/14/2013  . Insomnia 09/23/2012  . Low bone mass 09/23/2012  . Mixed incontinence 09/23/2012  . Vitamin D deficiency 09/23/2012  . Polycythemia vera ([place]) 08/15/2011  . Depression with anxiety   . Polycythemia   . Thrombocytosis     Arliss Journey, PT, DPT  05/06/2020, 4:38 PM  [facility] [address] [place], [place],  [address] Phone: [phone]   Fax:  [phone]  Name: Carolyn Figueroa MRN: [id number] Date of Birth: [birth year]

## 2020-05-10 ENCOUNTER — Ambulatory Visit: Payer: Medicare Other

## 2020-05-13 ENCOUNTER — Ambulatory Visit: Payer: Medicare Other | Admitting: Physical Therapy

## 2020-05-13 ENCOUNTER — Encounter: Payer: Self-pay | Admitting: Physical Therapy

## 2020-05-13 ENCOUNTER — Other Ambulatory Visit: Payer: Self-pay

## 2020-05-13 VITALS — BP 124/78 | HR 96

## 2020-05-13 DIAGNOSIS — R42 Dizziness and giddiness: Secondary | ICD-10-CM

## 2020-05-13 DIAGNOSIS — R2689 Other abnormalities of gait and mobility: Secondary | ICD-10-CM

## 2020-05-13 DIAGNOSIS — R2681 Unsteadiness on feet: Secondary | ICD-10-CM

## 2020-05-13 DIAGNOSIS — M6281 Muscle weakness (generalized): Secondary | ICD-10-CM

## 2020-05-13 NOTE — Therapy (Signed)
[facility] [address] [place], [place], [address] Phone: [phone]   Fax:  [phone]  Physical Therapy Treatment  Patient Details  Name: Carolyn Figueroa MRN: [id number] Date of Birth: 1946-03-01 Referring Provider (PT): Fenton Foy, NP (Referring). Followed by Alonza Bogus, DO for PD   Encounter Date: 05/13/2020   PT End of Session - 05/13/20 1615    Visit Number 14    Number of Visits 17    Date for PT Re-Evaluation 07/18/20   POC for 5 weeks, Cert for 90 days   Authorization Type Medicare (10th Visit PN)    Progress Note Due on Visit 10    PT Start Time 1531    PT Stop Time 1613    PT Time Calculation (min) 42 min    Equipment Utilized During Treatment Gait belt    Activity Tolerance Patient tolerated treatment well    Behavior During Therapy WFL for tasks assessed/performed           Past Medical History:  Diagnosis Date  . Colon polyp 05/2003  . Depression with anxiety   . HTN (hypertension)   . Insomnia   . Need for prophylactic vaccination and inoculation against influenza 01/14/2013  . Polycythemia   . 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 Thrombocytosis   . Tremor 11/16/2013  . Varicose vein   . Vitamin D deficiency   . Vulvar candidiasis   . Weight loss 11/16/2013    Past Surgical History:  Procedure Laterality Date  . NO PAST SURGERIES      Vitals:   05/13/20 1535 05/13/20 1550 05/13/20 1612  BP: (!) 151/94 139/87 124/78  Pulse: 86 91 96     Subjective Assessment - 05/13/20 1533    Subjective Reports feeling a little bit more dizzy today. Still having no energy. No other changes since she was last here.    Patient is accompained by: Family member   daughter   Patient Stated Goals Pt would like to work on improving balance and dizziness    Currently in Pain? Yes    Pain Score 4     Pain Location Back    Pain Orientation Lower    Pain Descriptors / Indicators Aching     Pain Type Chronic pain    Aggravating Factors  bending down    Pain Relieving Factors aleve                                  Balance Exercises - 05/13/20 1549      Balance Exercises: Standing   Standing Eyes Closed Narrow base of support (BOS);Foam/compliant surface;3 reps;30 secs    Standing Eyes Closed Limitations on air ex, feet together 3 x 30 seconds, with narrow BOS but not together eyes closed head turns 2 x 10 reps, head nods 2 x 10 reps    Stepping Strategy Posterior;Anterior;Foam/compliant surface;10 reps;Limitations    Stepping Strategy Limitations standing on blue foam beam x10 reps each direction, cues for sequencing and foot clearance - no UE support    Tandem Gait Forward;3 reps;Limitations    Tandem Gait Limitations down and back on blue balance beam    Sidestepping 3 reps;Limitations;Other (comment)    Sidestepping Limitations on blue foam beam: cues for foot clearance and slow    Marching Foam/compliant surface;10 reps;Limitations    Marching Limitations on blue air ex - slow marching x10 reps  B with no UE support    Other Standing Exercises standing on blue air ex in corner: wide BOS and lateral trunk rotations 2 x 5 reps B reaching towards sticky note, standing on blue balance beam at countertop with wide BOS reaching superiorly/laterally towards sticky note 2 x 5 reps B, pt reporting 5/10 dizziness after but subsiding with rest             PT Education - 05/13/20 1615    Education Details monitoring BP at home (pt with elevated diastolic at start of session), increasing activity at home - trying to get up and walk a couple laps around the house every 1-2 hours to slowly incr activity at home (pt reports she is sitting a lot).    Person(s) Educated Patient    Methods Explanation    Comprehension Verbalized understanding            PT Short Term Goals - 05/13/20 1617      PT SHORT TERM GOAL #1   Title Patient will report improved  compliance with HEP completing at >/= 5 days/week for improved self management of dizziness    Baseline pt performing 3x a week    Time 3    Period Weeks    Status Not Met    Target Date 05/10/20             PT Long Term Goals - 04/19/20 1952      PT LONG TERM GOAL #1   Title Pt will be independent with final vestibular/balance HEP (All LTGs Due: 05/24/20)    Baseline continue to progress HEP    Time 5    Period Weeks    Status Revised    Target Date 05/24/20      PT LONG TERM GOAL #2   Title Pt will have improved DHI to </= 40 to demonstrate improved symptoms and QoL    Baseline DHI: 58    Time 5    Period Weeks    Status Revised      PT LONG TERM GOAL #3   Title Pt will have improved TUG score to </=12 sec to demo decreased fall risk    Baseline 12.75 secs    Time 5    Period Weeks    Status On-going      PT LONG TERM GOAL #4   Title Pt will have improved DGI to >/=21/24 to demo decreased fall risk    Baseline 19/24; 19/24 on 1/25    Time 5    Period Weeks    Status On-going                 Plan - 05/13/20 1617    Clinical Impression Statement Pt with incr diastolic BP at start of session (but still [facility] for therapy), but BP decreased between activities in sesson. Focused on balance on compliant surfaces with decr UE support. Intermittent seated rest breaks taken as needed. Pt with 5/10 dizziness after reaching/looking up towards target, but subsided with rest. Will continue to progress towards LTGs.    Personal Factors and Comorbidities Age;Comorbidity 1;Comorbidity 2;Past/Current Experience    Comorbidities post-COVID, Parkinson's disease,severe cervical spinal stenosis (declined surgery), depression, HTN, polycythemia    Examination-Activity Limitations Locomotion Level;Caring for Others;Stand    Examination-Participation Restrictions Driving;Community Activity;Cleaning    Stability/Clinical Decision Making Evolving/Moderate complexity    Rehab Potential  Good    PT Frequency 2x / week    PT Duration 4 weeks  plus 1x/week for 1 week prior   PT Treatment/Interventions Aquatic Therapy;Cryotherapy;Electrical Stimulation;Moist Heat;DME Instruction;Gait training;Stair training;Functional mobility training;Therapeutic activities;Therapeutic exercise;Balance training;Neuromuscular re-education;Cognitive remediation;Orthotic Fit/Training;Patient/family education;Manual techniques;Passive range of motion;Dry needling;Energy conservation;Vestibular    PT Next Visit Plan discuss re-cert vs. D/C. Complete SciFit warm up. Continue to progress vestibular, balance, dynamic gait and strengthening exercises as tolerated. Continue activites with dual task/cognitive challenge    PT Home Exercise Plan Access Code: [facility]    Consulted and Agree with Plan of Care Patient    Family Member Consulted Daughter           Patient will benefit from skilled therapeutic intervention in order to improve the following deficits and impairments:  Abnormal gait,Decreased balance,Decreased endurance,Difficulty walking,Dizziness,Decreased activity tolerance,Decreased strength  Visit Diagnosis: Unsteadiness on feet  Dizziness and giddiness  Other abnormalities of gait and mobility  Muscle weakness (generalized)     Problem List Patient Active Problem List   Diagnosis Date Noted  . Dizziness 02/29/2020  . 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 History of COVID-19 02/29/2020  . Anemia due to antineoplastic chemotherapy 12/18/2019  . Preventive measure 12/23/2018  . Needs flu shot 12/03/2017  . Risk for falls 11/30/2015  . Essential hypertension 07/19/2015  . Parkinson disease ([place]) 07/19/2015  . Weight loss 11/16/2013  . Tremor 11/16/2013  . Elevated BUN 08/20/2013  . Leg pain 08/20/2013  . Need for prophylactic vaccination and inoculation against influenza 01/14/2013  . Insomnia 09/23/2012  . Low bone mass 09/23/2012   . Mixed incontinence 09/23/2012  . Vitamin D deficiency 09/23/2012  . Polycythemia vera ([place]) 08/15/2011  . Depression with anxiety   . Polycythemia   . Thrombocytosis     Arliss Journey, PT, DPT  05/13/2020, 4:19 PM  [facility] [address] [place], [place], [address] Phone: [phone]   Fax:  [phone]  Name: Elleana Stillson MRN: [id number] Date of Birth: 1946/01/25

## 2020-05-16 ENCOUNTER — Other Ambulatory Visit: Payer: Self-pay

## 2020-05-16 ENCOUNTER — Ambulatory Visit: Payer: Medicare Other | Admitting: Physical Therapy

## 2020-05-16 ENCOUNTER — Ambulatory Visit (INDEPENDENT_AMBULATORY_CARE_PROVIDER_SITE_OTHER): Payer: Medicare Other | Admitting: Neurology

## 2020-05-16 ENCOUNTER — Encounter: Payer: Self-pay | Admitting: Physical Therapy

## 2020-05-16 ENCOUNTER — Encounter: Payer: Self-pay | Admitting: Neurology

## 2020-05-16 VITALS — BP 142/92 | HR 95 | Ht 59.0 in | Wt 122.0 lb

## 2020-05-16 DIAGNOSIS — R439 Unspecified disturbances of smell and taste: Secondary | ICD-10-CM | POA: Diagnosis not present

## 2020-05-16 DIAGNOSIS — R430 Anosmia: Secondary | ICD-10-CM | POA: Diagnosis not present

## 2020-05-16 DIAGNOSIS — R42 Dizziness and giddiness: Secondary | ICD-10-CM

## 2020-05-16 DIAGNOSIS — U099 Post covid-19 condition, unspecified: Secondary | ICD-10-CM | POA: Diagnosis not present

## 2020-05-16 DIAGNOSIS — Z8669 Personal history of other diseases of the nervous system and sense organs: Secondary | ICD-10-CM | POA: Insufficient documentation

## 2020-05-16 DIAGNOSIS — R2689 Other abnormalities of gait and mobility: Secondary | ICD-10-CM

## 2020-05-16 DIAGNOSIS — R2681 Unsteadiness on feet: Secondary | ICD-10-CM

## 2020-05-16 DIAGNOSIS — R438 Other disturbances of smell and taste: Secondary | ICD-10-CM

## 2020-05-16 DIAGNOSIS — R262 Difficulty in walking, not elsewhere classified: Secondary | ICD-10-CM

## 2020-05-16 NOTE — Patient Instructions (Addendum)
Gaze Stabilization: Standing Feet Apart    Feet shoulder width apart, keeping eyes on target on wall 3-4 feet away in front of a busy background (poster,painting),  tilt head down 15-30 and move head side to side for 60 seconds.   Repeat while moving head up and down for 60 seconds. Stop and wait for dizziness to go away before performing again.   Do 2 sessions per day.  Do not let symptoms get above 6/10. If they rise take rest break and then resume.      Access Code: [facility] URL: [email or web address] Date: 05/16/2020 Prepared by: Janann August  Exercises Standing Gaze Stabilization with Two Near Targets and Head Rotation - 1 x daily - 5 x weekly - 2 sets - 10 reps Standing Gaze Stabilization with Two Near Targets and Head Nod - 1 x daily - 5 x weekly - 2 sets - 10 reps Romberg Stance Eyes Closed on Foam Pad - 1 x daily - 5 x weekly - 3 sets - 30 sec hold Narrow Stance with Eyes Closed and Head Nods on Foam Pad - 1 x daily - 5 x weekly - 2 sets - 10 reps Narrow Stance with Eyes Closed and Head Rotation on Foam Pad - 1 x daily - 5 x weekly - 2 sets - 10 reps

## 2020-05-16 NOTE — Patient Instructions (Signed)
Loss of smell, also known as ANOSMIA, is one of the most common symptoms of COVID-19.   The [facility] Professor of Otolaryngology Daphane Shepherd, MD, and Assistant Professor Annitta Jersey, MD, have investigated several treatments for persistent anosmia (loss of smell), with a special interest in viral-related anosmia.     As the number of total, confirmed COVID-19 cases increases so does the number of people suffering from disease-related anosmia, making anosmia a significant public health problem.     Rehabilitation of anosmia :   Smell daily one sample of the following categories; and look at a picture of the object - f. Example at a picture of a lemon while smelling a lemon oil.   A floral scent  - such as rose, lavender, or vanilla - all would qualify for these.  A spice scent - nutmeg, anise, coffee, cinnamon-  A citrus smell - lemon, lime, orange  A menthol or minty scent, or eucalyptus.   Goal is to re-associate scent ( and eventually taste ) to the image.  The success is gradual, and this form of rehabilitation may take 12 month to succeed.   Another complication is PAROSMIA - smelling something that is not present, often an unpleasant smell of burning rubber or spoiled , foul smells.   This can be reduced by using a carbamazepine at 100 mg po bid. This antiepileptic medication slows the nerve conduction and allows the reduction in abnormal smell sensation.    Treatment of ANOSMIA and Ageusia:  Articles from [place] program...  If the combination of smell and vision of the same object that you're smelling improves the recovery of smell, then just smell alone. So we call that bimodal, visual and olfactory stimulation. So the standard olfactory training is four scents: rose, lemon, cloves and eucalyptus.  And the subject sniffs twice a day, 10, 12, 20 seconds each time the four essential oils for anywhere to six, eight or 12 weeks of olfactory  training for anosmia has been around for maybe 10, 15 years, studies suggest it works.  There's nothing absolutely definitive- but it can't hurt. It's thought to work by retraining the brain, the process of neuroplasticity, retraining the brain to smell these odors again. And we think that by smelling, you can retrain the brain. Now, one of the outstanding questions is, are you retraining the brain to smell rose, lemon, cloves and eucalyptus better, but forget about coffee, vanilla, coconut?  How generalizable is the improvement in smell? Clair Gulling, we asked the patients what smells would you like to pick and train on? The answer was fascinating to [place].  I thought it might be coffee as a coffee lover. I thought it might be vanilla as a vanilla-bean ice cream lover. It was smoke. The number one odor, smell, if you will, that people wanted to train on and to get better was smoke for the reasons that we talked about before.  The sense of smell is first and foremost attached to safety. And these people felt that if they could smell smoke better, they would feel more safe.  Unfortunately, there isn't a smoke essential oil so we couldn't do that.  But it just reinforced to [place] how important it is for patients to pick what smells they want to train on.  The other unique aspect that should be brought up is that half of the group are looking at high-quality pictures of the item, one of four items they picked, and each one  of them have high-quality photographs presented on their iPhone or iPad at the same time that they smell."   Larey Seat, MD

## 2020-05-16 NOTE — Progress Notes (Signed)
Provider:  Larey Seat, M D  Referring Provider: Fenton Foy, NP    Primary Care Physician:  [name], MD  Chief Complaint  Patient presents with  . New Patient (Initial Visit)    Pt with daughter, rm 42. Presents today post Covid Nov 2020. She still has c/o of no taste/smell. This has caused decreased in appetite and she isn't eating well. (she is already established patient with Dr Tat for Parkinson's) presents today to only address loss of taste and smell     HPI:  Carolyn Figueroa is a 75 y.o. female of [place] descent and seen here upon request by NP [employer].  She presents with her daughter.  She reports she lost taste and smell in November 2020,contracted COVID, her daughter is the Environmental education officer for the county, her husband was Mudlogger of a long term care facility.  This may have been the way the patient contracted the disease.   Grapefruit oil- she stated vanilla or almond - she is guessing.  Lavender-       Not identified. Does not smell it.  Peppermint -  Can't smell anything  Clove - not smellng anything Rum- not smelling anything. Vanilla -  Smelling vanilla !  coconut-  She guessed almond ?    Her taste for garlic and ginger has been altered only now , after COVID, she is feeling sick.  She cannot smell smoke anymore.  She was an avid tea drinker and now doesn't crave it.  She has once been commented on hr daughters perfume and her daughter -parosmia.    Even before COVID there was a tendency to have reduced smell and a taste- she started to use stronger and more spices, this may have been present since 2013- slowly progressing.  She also has PD and polycythemia vera.     Review of Systems: Out of a complete 14 system review, the patient complains of only the following symptoms, and all other reviewed systems are negative.  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  The chances of complete recovery are very limited, she can create new associations.   Social History   Socioeconomic History  . Marital status: Married    Spouse name: Not on file  . Number of children: 2  . Years of education: Not on file  . Highest education level: Some college, no degree  Occupational History    Employer: UNEMPLOYED    Comment: Housewife  Tobacco Use  . Smoking status: Never Smoker  . Smokeless tobacco: Never Used  Vaping Use  . Vaping Use: Never used  Substance and Sexual Activity  . Alcohol use: No  . Drug use: No  . Sexual activity: Never    Birth control/protection: None  Other Topics Concern  . Not on file  Social History Narrative  . Not on file   Social Determinants of Health   Financial Resource Strain: Not on file  Food Insecurity: Not on file  Transportation Needs: Not on file  Physical Activity: Not on file  Stress: Not on file  Social Connections: Not on file  Intimate Partner Violence: Not on file    Family History  Problem Relation Age of Onset  . Heart attack Father   . Clotting disorder Father   . Cancer Paternal Uncle        unknown subtype  . Healthy  Child     Past Medical History:  Diagnosis Date  . Colon polyp 05/2003  . Depression with anxiety   . HTN (hypertension)   . Insomnia   . Need for prophylactic vaccination and inoculation against influenza 01/14/2013  . Polycythemia   . 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 Thrombocytosis   . Tremor 11/16/2013  . Varicose vein   . Vitamin D deficiency   . Vulvar candidiasis   . Weight loss 11/16/2013    Past Surgical History:  Procedure Laterality Date  . NO PAST SURGERIES      Current Outpatient Medications  Medication Sig Dispense Refill  . aspirin 81 MG tablet Take 81 mg by mouth daily.    . carbidopa-levodopa (SINEMET CR) 50-200 MG tablet Take 1 tablet by mouth at bedtime. 90 tablet 0  . carbidopa-levodopa (SINEMET IR) 25-100 MG tablet Take  2 tablets by mouth 3 (three) times daily. 180 tablet 0  . hydroxyurea (HYDREA) 500 MG capsule TAKE 2 CAPSULES BY MOUTH ON MONDAY AND FRIDAYS AND 1 CAPSULE BY MOUTH the rest of the week 100 capsule 6  . meclizine (ANTIVERT) 12.5 MG tablet Take 12.5 mg by mouth 3 (three) times daily as needed for dizziness.    . Multiple Vitamin (MULTIVITAMIN WITH MINERALS) TABS tablet Take 1 tablet by mouth daily.    . sertraline (ZOLOFT) 50 MG tablet Take 0.5 tablets by mouth 2 (two) times daily.    Marland Kitchen tolterodine (DETROL LA) 2 MG 24 hr capsule Take 2 mg by mouth daily.     No current facility-administered medications for this visit.    Allergies as of 05/16/2020  . (No Known Allergies)    Vitals: BP (!) 142/92   Pulse 95   Ht 4\' 11"  (1.499 m)   Wt 122 lb (55.3 kg)   BMI 24.64 kg/m  Last Weight:  Wt Readings from Last 1 Encounters:  05/16/20 122 lb (55.3 kg)   Last Height:   Ht Readings from Last 1 Encounters:  05/16/20 4\' 11"  (1.499 m)    Physical exam:  General: The patient is awake, alert and appears not in acute distress. The patient is well groomed. Head: Normocephalic, atraumatic. Neck is supple.  Cardiovascular:  Regular rate and rhythm without  murmurs or carotid bruit, and without distended neck veins. Respiratory: Lungs are clear to auscultation. Skin:  Without evidence of edema, or rash Trunk: BMI is 24.6 Neurologic exam : The patient is awake and alert, oriented to place and time.   Memory subjective described as impaired.  There is a normal attention span & concentration ability. Speech is fluent with dysphonia  Mood and affect are appropriate.  Cranial nerves: Pupils are equal and briskly reactive to light.  Funduscopic exam without  evidence of pallor or edema. Arcus senilis. Extraocular movements  in vertical and horizontal planes intact and without nystagmus. No restriction.   Visual fields by finger perimetry are intact. Hearing to finger rub intact.   Facial  sensation intact to fine touch.  Facial motor strength is symmetric and her tongue and uvula move midline. Tongue protrusion into either cheek is normal. Shoulder shrug is normal.   Motor exam:  Symmetric muscle bulk and symmetric strength in all extremities.  Sensory:  Fine touchand vibration were tested in all extremities. She felt this very well.    Coordination: Rapid alternating movements in the fingers/hands were slowed, her right hand supinated , and her left hand has a mild tremor closer to target.Finger-to-nose maneuver  evidence of dysmetria or tremor.  Gait and station: Patient walks without assistive device.   She reports dizziness, sometimes from orthostasis and sometimes vertigo. She may feel as if the car is moving when it isn't.  She responds this way to rapid head movements.    Deep tendon reflexes: in the upper and lower extremities are symmetric and intact.  Babinski maneuver response is down-going on the right and equivocal on the left- .   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  The chances of complete recovery are very limited, she can create new associations.   The patient has two causes of olfactory impairment, one with slow progression of loss of taste and smell, progressing notably since 2012-2013.    Plan:  Treatment plan and additional workup :The covid 19 related acute changes can be addressed with association therapy.       Asencion Partridge Amadea Keagy MD 05/16/2020

## 2020-05-16 NOTE — Therapy (Signed)
[facility] [address] [place], [place], [address] Phone: [phone]   Fax:  [phone]  Physical Therapy Treatment  Patient Details  Name: Carolyn Figueroa MRN: [id number] Date of Birth: 1946/03/19 Referring Provider (PT): Fenton Foy, NP (Referring). Followed by Alonza Bogus, DO for PD   Encounter Date: 05/16/2020   PT End of Session - 05/16/20 1638    Visit Number 15    Number of Visits 17    Date for PT Re-Evaluation 07/18/20   POC for 5 weeks, Cert for 90 days   Authorization Type Medicare (10th Visit PN)    Progress Note Due on Visit 10    PT Start Time 1531    PT Stop Time 1617    PT Time Calculation (min) 46 min    Equipment Utilized During Treatment Gait belt    Activity Tolerance Patient tolerated treatment well    Behavior During Therapy WFL for tasks assessed/performed           Past Medical History:  Diagnosis Date  . Colon polyp 05/2003  . Depression with anxiety   . HTN (hypertension)   . Insomnia   . Need for prophylactic vaccination and inoculation against influenza 01/14/2013  . Polycythemia   . 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 Thrombocytosis   . Tremor 11/16/2013  . Varicose vein   . Vitamin D deficiency   . Vulvar candidiasis   . Weight loss 11/16/2013    Past Surgical History:  Procedure Laterality Date  . NO PAST SURGERIES      There were no vitals filed for this visit.   Subjective Assessment - 05/16/20 1533    Subjective Saw the neurologist earlier today due to loss of tase and smell. Reports dizziness is better now.    Patient is accompained by: Family member   daughter   Patient Stated Goals Pt would like to work on improving balance and dizziness    Currently in Pain? Yes    Pain Score 3     Pain Location Back    Pain Orientation Lower    Pain Descriptors / Indicators Aching    Pain Type Chronic pain    Aggravating Factors  bending down    Pain  Relieving Factors aleve                      Access Code: [facility] URL: [email or web address] Date: 05/16/2020 Prepared by: Janann August  Finalized pt's HEP: See MedBridge for more details.   Exercises Standing Gaze Stabilization with Two Near Targets and Head Rotation - 1 x daily - 5 x weekly - 2 sets - 10 reps Standing Gaze Stabilization with Two Near Targets and Head Nod - 1 x daily - 5 x weekly - 2 sets - 10 reps -pt reporting mild dizziness afterwards, subsided with brief standing rest break    Upgraded balance to standing on pillows with eyes closed for incr vestibular input as pt currently performing on level ground:  Romberg Stance Eyes Closed on Foam Pad - 1 x daily - 5 x weekly - 3 sets - 30 sec hold - with feet together  Narrow Stance with Eyes Closed and Head Nods on Foam Pad - 1 x daily - 5 x weekly - 2 sets - 10 reps - with feet slightly apart  Narrow Stance with Eyes Closed and Head Rotation on Foam Pad - 1 x daily - 5 x weekly - 2 sets -  10 reps - with feet slightly apart         Vestibular Treatment/Exercise - 05/16/20 0001      Vestibular Treatment/Exercise   Vestibular Treatment Provided Gaze    Gaze Exercises X1 Viewing Horizontal;X1 Viewing Vertical      X1 Viewing Horizontal   Foot Position first set: feet together plain background then standing feet apart, patterned background    Reps 1    Comments x60 seconds in front of plain background, pt reporting no dizziness, performed 2 x 60 seconds in front of patterned background with pt reporting 3/10 sx that subsided with rest - upgraded HEP to include patterned background      X1 Viewing Vertical   Foot Position first set: feet together plain background then standing feet apart, patterned background    Reps 1    Comments x60 seconds in front of plain background, pt reporting no dizziness, performed 2 x 60 seconds in front of patterned background with pt reporting 3/10 sx that  subsided with rest - upgraded HEP to include patterned background                 PT Education - 05/16/20 1636    Education Details final HEP, discussed end of POC at the end of this week and last scheduled visit, pt reports that she wants to finish with PT at this time and work on HEP at home. discussed if pt still has sx/not getting better in a couple months to get a new referral to come back to therapy, pt and pt's daughter verbalized undestanding    Person(s) Educated Patient;Child(ren)    Methods Explanation;Demonstration;Verbal cues;Handout    Comprehension Returned demonstration;Verbalized understanding;Verbal cues required            PT Short Term Goals - 05/13/20 1617      PT SHORT TERM GOAL #1   Title Patient will report improved compliance with HEP completing at >/= 5 days/week for improved self management of dizziness    Baseline pt performing 3x a week    Time 3    Period Weeks    Status Not Met    Target Date 05/10/20             PT Long Term Goals - 05/16/20 1639      PT LONG TERM GOAL #1   Title Pt will be independent with final vestibular/balance HEP (All LTGs Due: 05/24/20)    Baseline finalized HEP on 05/16/20    Time 5    Period Weeks    Status Revised      PT LONG TERM GOAL #2   Title Pt will have improved DHI to </= 40 to demonstrate improved symptoms and QoL    Baseline DHI: 58    Time 5    Period Weeks    Status Revised      PT LONG TERM GOAL #3   Title Pt will have improved TUG score to </=12 sec to demo decreased fall risk    Baseline 12.75 secs    Time 5    Period Weeks    Status On-going      PT LONG TERM GOAL #4   Title Pt will have improved DGI to >/=21/24 to demo decreased fall risk    Baseline 19/24; 19/24 on 1/25    Time 5    Period Weeks    Status On-going  Plan - 05/16/20 1639    Clinical Impression Statement Discussed POC ending at the end of this week with pt reporting she would like to take a  break from therapy at this time and work on exercises at home. Reviewed and finalized pt's HEP for balance and vestibular impairments. Pt reporting mild symptoms with VOR x1 in front of a busy background - upgraded for HEP. Will assess remaining LTGs at next session for D/C.    Personal Factors and Comorbidities Age;Comorbidity 1;Comorbidity 2;Past/Current Experience    Comorbidities post-COVID, Parkinson's disease,severe cervical spinal stenosis (declined surgery), depression, HTN, polycythemia    Examination-Activity Limitations Locomotion Level;Caring for Others;Stand    Examination-Participation Restrictions Driving;Community Activity;Cleaning    Stability/Clinical Decision Making Evolving/Moderate complexity    Rehab Potential Good    PT Frequency 2x / week    PT Duration 4 weeks   plus 1x/week for 1 week prior   PT Treatment/Interventions Aquatic Therapy;Cryotherapy;Electrical Stimulation;Moist Heat;DME Instruction;Gait training;Stair training;Functional mobility training;Therapeutic activities;Therapeutic exercise;Balance training;Neuromuscular re-education;Cognitive remediation;Orthotic Fit/Training;Patient/family education;Manual techniques;Passive range of motion;Dry needling;Energy conservation;Vestibular    PT Next Visit Plan any questions about HEP? check remaining LTGs and D/C from therapy.    PT Home Exercise Plan Access Code: [facility]    Consulted and Agree with Plan of Care Patient    Family Member Consulted Daughter           Patient will benefit from skilled therapeutic intervention in order to improve the following deficits and impairments:  Abnormal gait,Decreased balance,Decreased endurance,Difficulty walking,Dizziness,Decreased activity tolerance,Decreased strength  Visit Diagnosis: Unsteadiness on feet  Dizziness and giddiness  Other abnormalities of gait and mobility  Difficulty in walking, not elsewhere classified     Problem List Patient Active Problem  List   Diagnosis Date Noted  . 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 History of Parkinson's disease 05/16/2020  . Dizziness 02/29/2020  . 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 History of COVID-19 02/29/2020  . Anemia due to antineoplastic chemotherapy 12/18/2019  . Preventive measure 12/23/2018  . Needs flu shot 12/03/2017  . Risk for falls 11/30/2015  . Essential hypertension 07/19/2015  . Parkinson disease ([place]) 07/19/2015  . Weight loss 11/16/2013  . Tremor 11/16/2013  . Elevated BUN 08/20/2013  . Leg pain 08/20/2013  . Need for prophylactic vaccination and inoculation against influenza 01/14/2013  . Insomnia 09/23/2012  . Low bone mass 09/23/2012  . Mixed incontinence 09/23/2012  . Vitamin D deficiency 09/23/2012  . Polycythemia vera ([place]) 08/15/2011  . Depression with anxiety   . Polycythemia   . Thrombocytosis     Arliss Journey, PT, DPT  05/16/2020, 4:41 PM  [facility] [address] [place], [place], [address] Phone: [phone]   Fax:  [phone]  Name: Kaloni Bisaillon MRN: [id number] Date of Birth: 1946-02-28

## 2020-05-20 ENCOUNTER — Other Ambulatory Visit: Payer: Self-pay

## 2020-05-20 ENCOUNTER — Ambulatory Visit: Payer: Medicare Other

## 2020-05-20 DIAGNOSIS — R42 Dizziness and giddiness: Secondary | ICD-10-CM

## 2020-05-20 DIAGNOSIS — T451X5A Adverse effect of antineoplastic and immunosuppressive drugs, initial encounter: Secondary | ICD-10-CM

## 2020-05-20 DIAGNOSIS — D6481 Anemia due to antineoplastic chemotherapy: Secondary | ICD-10-CM

## 2020-05-20 DIAGNOSIS — R2681 Unsteadiness on feet: Secondary | ICD-10-CM

## 2020-05-21 NOTE — Therapy (Signed)
[facility] [address] [place], [place], [address] Phone: [phone]   Fax:  [phone]  Physical Therapy Treatment/DC summary  Patient Details  Name: Carolyn Figueroa MRN: [id number] Date of Birth: Oct 22, 1945 Referring Provider (PT): Fenton Foy, NP (Referring). Followed by Alonza Bogus, DO for PD   Encounter Date: 05/20/2020   PT End of Session - 05/20/20 1618    Visit Number 16    Number of Visits 17    Date for PT Re-Evaluation 07/18/20   POC for 5 weeks, Cert for 90 days   Authorization Type Medicare (10th Visit PN)    Progress Note Due on Visit 10    PT Start Time 1530    PT Stop Time 1615    PT Time Calculation (min) 45 min    Equipment Utilized During Treatment Gait belt    Activity Tolerance Patient tolerated treatment well    Behavior During Therapy WFL for tasks assessed/performed           Past Medical History:  Diagnosis Date  . Colon polyp 05/2003  . Depression with anxiety   . HTN (hypertension)   . Insomnia   . Need for prophylactic vaccination and inoculation against influenza 01/14/2013  . Polycythemia   . 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 Thrombocytosis   . Tremor 11/16/2013  . Varicose vein   . Vitamin D deficiency   . Vulvar candidiasis   . Weight loss 11/16/2013    Past Surgical History:  Procedure Laterality Date  . NO PAST SURGERIES      There were no vitals filed for this visit.   Subjective Assessment - 05/20/20 1617    Subjective accompanied by dtr, feels ready to DC and continue HEP knowing she can return to PT if symptoms worsen    Patient is accompained by: Family member   daughter   Patient Stated Goals Pt would like to work on improving balance and dizziness    Pain Score 0-No pain            05/20/20 0001  Ambulation/Gait  Ambulation/Gait Yes  Ambulation/Gait Assistance 5: Supervision  Ambulation/Gait Assistance Details over challenging terrain   Assistive device None  Gait Pattern Step-through pattern  Ambulation Surface Level;Indoor  Dynamic Gait Index  Level Surface 3  Change in Gait Speed 3  Gait with Horizontal Head Turns 3  Gait with Vertical Head Turns 3  Gait and Pivot Turn 2  Step Over Obstacle 3  Step Around Obstacles 3  Steps 2  Total Score 22  Timed Up and Go Test  Normal TUG (seconds) 11.8  TUG Normal TUG  TUG Comments average of 2 trials      05/20/20 0001  Ambulation/Gait  Ambulation/Gait Yes  Ambulation/Gait Assistance 5: Supervision  Ambulation/Gait Assistance Details over challenging terrain  Assistive device None  Gait Pattern Step-through pattern  Ambulation Surface Level;Indoor                            PT Education - 05/21/20 1415    Education Details verbal review of HEP reinforcing compliance    Person(s) Educated Patient;Child(ren)    Methods Explanation;Verbal cues    Comprehension Verbalized understanding            PT Short Term Goals - 05/20/20 1602      PT SHORT TERM GOAL #1   Title Patient will report improved compliance with HEP completing at >/=  5 days/week for improved self management of dizziness    Baseline pt performing 3x a week; 2/25 performs some exercises at least every day    Time 3    Period Weeks    Status Achieved    Target Date 05/10/20             PT Long Term Goals - 05/20/20 1604      PT LONG TERM GOAL #1   Title Pt will be independent with final vestibular/balance HEP (All LTGs Due: 05/24/20)    Baseline finalized HEP on 05/16/20    Time 5    Period Weeks    Status Achieved      PT LONG TERM GOAL #2   Title Pt will have improved DHI to </= 40 to demonstrate improved symptoms and QoL    Baseline DHI: 42    Time 5    Period Weeks    Status Partially Met      PT LONG TERM GOAL #3   Title Pt will have improved TUG score to </=12 sec to demo decreased fall risk    Baseline 12.75 secs; 11.8 s standard TUG    Time 5     Period Weeks    Status Achieved      PT LONG TERM GOAL #4   Title Pt will have improved DGI to >/=21/24 to demo decreased fall risk    Baseline 19/24; 19/24 on 1/25; 22/24 22/24    Time 5    Period Weeks    Status Achieved                 Plan - 05/20/20 1619    Clinical Impression Statement See treatment note/DC summary, proceed with DC, patient and dtr are in agreement    Personal Factors and Comorbidities Age;Comorbidity 1;Comorbidity 2;Past/Current Experience    Comorbidities post-COVID, Parkinson's disease,severe cervical spinal stenosis (declined surgery), depression, HTN, polycythemia    Examination-Activity Limitations Locomotion Level;Caring for Others;Stand    Examination-Participation Restrictions Driving;Community Activity;Cleaning    Stability/Clinical Decision Making Evolving/Moderate complexity    Rehab Potential Good    PT Frequency 2x / week    PT Duration 4 weeks   plus 1x/week for 1 week prior   PT Treatment/Interventions Aquatic Therapy;Cryotherapy;Electrical Stimulation;Moist Heat;DME Instruction;Gait training;Stair training;Functional mobility training;Therapeutic activities;Therapeutic exercise;Balance training;Neuromuscular re-education;Cognitive remediation;Orthotic Fit/Training;Patient/family education;Manual techniques;Passive range of motion;Dry needling;Energy conservation;Vestibular    PT Next Visit Plan n/a, DC visit    PT Home Exercise Plan Access Code: [facility]    Consulted and Agree with Plan of Care Patient    Family Member Consulted Daughter           Patient will benefit from skilled therapeutic intervention in order to improve the following deficits and impairments:  Abnormal gait,Decreased balance,Decreased endurance,Difficulty walking,Dizziness,Decreased activity tolerance,Decreased strength  Visit Diagnosis: Unsteadiness on feet  Anemia due to antineoplastic chemotherapy  Dizziness and giddiness     Problem List Patient  Active Problem List   Diagnosis Date Noted  . 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 History of Parkinson's disease 05/16/2020  . Dizziness 02/29/2020  . 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 History of COVID-19 02/29/2020  . Anemia due to antineoplastic chemotherapy 12/18/2019  . Preventive measure 12/23/2018  . Needs flu shot 12/03/2017  . Risk for falls 11/30/2015  . Essential hypertension 07/19/2015  . Parkinson disease ([place]) 07/19/2015  . Weight loss 11/16/2013  . Tremor 11/16/2013  . Elevated BUN 08/20/2013  .  Leg pain 08/20/2013  . Need for prophylactic vaccination and inoculation against influenza 01/14/2013  . Insomnia 09/23/2012  . Low bone mass 09/23/2012  . Mixed incontinence 09/23/2012  . Vitamin D deficiency 09/23/2012  . Polycythemia vera ([place]) 08/15/2011  . Depression with anxiety   . Polycythemia   . Thrombocytosis    FYI, DC summary for Carolyn Figueroa, all goals met or partially met, PT DC at request of patient and family.   Thank you for your referral,   Lanice Shirts PT 05/21/2020, 2:18 PM  [facility] [address] [place], [place], [address] Phone: [phone]   Fax:  [phone]  Name: Carolyn Figueroa MRN: [id number] Date of Birth: 03/02/1946

## 2020-05-21 NOTE — Patient Instructions (Signed)
Verbal review of HEP

## 2020-06-14 ENCOUNTER — Inpatient Hospital Stay (HOSPITAL_BASED_OUTPATIENT_CLINIC_OR_DEPARTMENT_OTHER): Payer: Medicare Other | Admitting: Hematology and Oncology

## 2020-06-14 ENCOUNTER — Inpatient Hospital Stay: Payer: Medicare Other | Attending: Hematology and Oncology

## 2020-06-14 ENCOUNTER — Other Ambulatory Visit: Payer: Self-pay

## 2020-06-14 DIAGNOSIS — Z79899 Other long term (current) drug therapy: Secondary | ICD-10-CM | POA: Insufficient documentation

## 2020-06-14 DIAGNOSIS — D45 Polycythemia vera: Secondary | ICD-10-CM | POA: Insufficient documentation

## 2020-06-14 DIAGNOSIS — Z7982 Long term (current) use of aspirin: Secondary | ICD-10-CM | POA: Insufficient documentation

## 2020-06-14 DIAGNOSIS — G20 Parkinson's disease: Secondary | ICD-10-CM | POA: Diagnosis not present

## 2020-06-14 DIAGNOSIS — I10 Essential (primary) hypertension: Secondary | ICD-10-CM

## 2020-06-14 LAB — CBC WITH DIFFERENTIAL/PLATELET
Abs Immature Granulocytes: 0.01 10*3/uL (ref 0.00–0.07)
Basophils Absolute: 0 10*3/uL (ref 0.0–0.1)
Basophils Relative: 1 %
Eosinophils Absolute: 0.1 10*3/uL (ref 0.0–0.5)
Eosinophils Relative: 3 %
HCT: 39.2 % (ref 36.0–46.0)
Hemoglobin: 12.4 g/dL (ref 12.0–15.0)
Immature Granulocytes: 0 %
Lymphocytes Relative: 30 %
Lymphs Abs: 1.3 10*3/uL (ref 0.7–4.0)
MCH: 33.2 pg (ref 26.0–34.0)
MCHC: 31.6 g/dL (ref 30.0–36.0)
MCV: 104.8 fL — ABNORMAL HIGH (ref 80.0–100.0)
Monocytes Absolute: 0.3 10*3/uL (ref 0.1–1.0)
Monocytes Relative: 6 %
Neutro Abs: 2.6 10*3/uL (ref 1.7–7.7)
Neutrophils Relative %: 60 %
Platelets: 348 10*3/uL (ref 150–400)
RBC: 3.74 MIL/uL — ABNORMAL LOW (ref 3.87–5.11)
RDW: 13.9 % (ref 11.5–15.5)
WBC: 4.2 10*3/uL (ref 4.0–10.5)
nRBC: 0 % (ref 0.0–0.2)

## 2020-06-14 MED ORDER — HYDROXYUREA 500 MG PO CAPS: ORAL_CAPSULE | ORAL | 6 refills | Status: DC

## 2020-06-15 ENCOUNTER — Encounter: Payer: Self-pay | Admitting: Hematology and Oncology

## 2020-06-15 NOTE — Assessment & Plan Note (Signed)
Her blood pressure is mildly elevated I would defer to her primary care doctor for medical management

## 2020-06-15 NOTE — Progress Notes (Signed)
[facility] OFFICE PROGRESS NOTE  Patient Care Team: [name], MD as PCP - General (Family Medicine) Christy Sartorius, MD as Referring Physician (Urology) Avel Sensor, MD as Attending Physician (Obstetrics and Gynecology) Heath Lark, MD as Consulting Physician (Hematology and Oncology) Tat, Eustace Quail, DO as Consulting Physician (Neurology)  ASSESSMENT & PLAN:  Polycythemia vera The patient has been taking her medications correctly CBC today is normal She is instructed to continue hydroxyurea 1000 mg on Mondays and Fridays and to take 500 mg for the rest of the week She will continue to take aspirin 81 mg daily I explained to the patient and her daughter circumstances that could cause bone marrow suppression including infection, extreme stress and surgery In those situations, she is instructed to call me and we might have to hold her treatment and follow closely Otherwise, I plan to see her back in 6 months  Essential hypertension Her blood pressure is mildly elevated I would defer to her primary care doctor for medical management   No orders of the defined types were placed in this encounter.   All questions were answered. The patient knows to call the clinic with any problems, questions or concerns. The total time spent in the appointment was 20 minutes encounter with patients including review of chart and various tests results, discussions about plan of care and coordination of care plan   Heath Lark, MD 06/15/2020 8:55 AM  INTERVAL HISTORY: Please see below for problem oriented charting. 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 The patient never had bone marrow aspirate and biopsy. She was started on hydroxyurea and dose was adjusted due to hair thinning and fatigue On 09/30/2013, hydroxyurea dose was increased to 1000 mg on Mondays, Wednesdays and Fridays and 2 take 500 mg the rest of the week. On 10/22/2013, the dose of hydroxyurea was increased to 1000 mg daily along with phlebotomy. On August 2015, the dose of hydroxyurea was modified to 1000 mg daily Mondays to Fridays and 500 mg on Saturdays and Sunday.  However, the patient was not following instruction correctly. On 07/20/2014, the dose of hydroxyurea was modifed to 500 mg daily Mondays to Fridays and 1000 mg on Saturdays and Sundays On 02/08/2015, the dose of hydroxyurea was modified to 500 milligrams daily Mondays to Saturdays and 1000 mg on Sundays. She was referred to neurologist in 2016 and was diagnosed with Parkinson's disease On 07/17/2016, the dose of hydroxyurea is adjusted.  She takes 500 mg daily except for 2 days a week on Wednesdays and Sundays she take 1000 mg Starting 11/14/2016, the dose of hydroxyurea is suggested.  She is instructed to take 1000 mg on Mondays, Wednesdays and Fridays and take 500 mg daily for the rest of the week Starting 12/18/2019, the dose of hydroxyurea is changed.  She is instructed to take 1000 mg on Mondays and Fridays and take 500 mg daily for the rest of the week   REVIEW OF SYSTEMS:   Constitutional: Denies fevers, chills or abnormal weight loss Eyes: Denies blurriness of vision Ears, nose, mouth, throat, and face: Denies mucositis or sore throat Respiratory: Denies cough, dyspnea or wheezes Cardiovascular: Denies palpitation, chest discomfort or  lower extremity swelling Gastrointestinal:  Denies nausea, heartburn or change in bowel habits Skin: Denies abnormal skin rashes Lymphatics: Denies new lymphadenopathy or easy bruising Neurological:Denies numbness, tingling or new weaknesses Behavioral/Psych: Mood  is stable, no new changes  All other systems were reviewed with the patient and are negative.  I have reviewed the past medical history, past surgical history, social history and family history with the patient and they are unchanged from previous note.  ALLERGIES:  has No Known Allergies.  MEDICATIONS:  Current Outpatient Medications  Medication Sig Dispense Refill  . aspirin 81 MG tablet Take 81 mg by mouth daily.    . carbidopa-levodopa (SINEMET CR) 50-200 MG tablet Take 1 tablet by mouth at bedtime. 90 tablet 0  . carbidopa-levodopa (SINEMET IR) 25-100 MG tablet Take 2 tablets by mouth 3 (three) times daily. 180 tablet 0  . hydroxyurea (HYDREA) 500 MG capsule TAKE 2 CAPSULES BY MOUTH ON MONDAY AND FRIDAYS AND 1 CAPSULE BY MOUTH the rest of the week 100 capsule 6  . meclizine (ANTIVERT) 12.5 MG tablet Take 12.5 mg by mouth 3 (three) times daily as needed for dizziness.    . Multiple Vitamin (MULTIVITAMIN WITH MINERALS) TABS tablet Take 1 tablet by mouth daily.    . sertraline (ZOLOFT) 50 MG tablet Take 0.5 tablets by mouth 2 (two) times daily.    Marland Kitchen tolterodine (DETROL LA) 2 MG 24 hr capsule Take 2 mg by mouth daily.     No current facility-administered medications for this visit.    PHYSICAL EXAMINATION: ECOG PERFORMANCE STATUS: 0 - Asymptomatic  Vitals:   06/14/20 1152  BP: (!) 156/92  Pulse: 98  Resp: 18  Temp: 98 F (36.7 C)  SpO2: 99%   Filed Weights   06/14/20 1152  Weight: 127 lb 3.2 oz (57.7 kg)    GENERAL:alert, no distress and comfortable SKIN: skin color, texture, turgor are normal, no rashes or significant lesions EYES: normal, Conjunctiva are pink and non-injected, sclera clear OROPHARYNX:no exudate, no erythema and lips, buccal mucosa, and tongue normal  NECK: supple, thyroid normal size, non-tender, without nodularity LYMPH:  no palpable lymphadenopathy in the cervical, axillary or inguinal LUNGS: clear to auscultation and percussion with normal breathing  effort HEART: regular rate & rhythm and no murmurs and no lower extremity edema ABDOMEN:abdomen soft, non-tender and normal bowel sounds Musculoskeletal:no cyanosis of digits and no clubbing  NEURO: alert & oriented x 3 with fluent speech, noted resting tremor  LABORATORY DATA:  I have reviewed the data as listed    Component Value Date/Time   NA 139 11/16/2013 1404   K 4.0 11/16/2013 1404   CL 102 07/30/2012 1419   CO2 28 11/16/2013 1404   GLUCOSE 89 11/16/2013 1404   GLUCOSE 102 (H) 07/30/2012 1419   BUN 30.0 (H) 11/16/2013 1404   CREATININE 0.9 11/16/2013 1404   CALCIUM 9.1 11/16/2013 1404   PROT 6.7 11/16/2013 1404   ALBUMIN 4.0 11/16/2013 1404   AST 15 11/16/2013 1404   ALT <6 11/16/2013 1404   ALKPHOS 49 11/16/2013 1404   BILITOT 1.45 (H) 11/16/2013 1404   GFRNONAA 58 (L) 11/22/2009 1855   GFRAA  11/22/2009 1855    >60        The eGFR has been calculated using the MDRD equation. This calculation has not been validated in all clinical situations. eGFR's persistently <60 mL/min signify possible Chronic Kidney Disease.    No results found for: SPEP, UPEP  Lab Results  Component Value  Date   WBC 4.2 06/14/2020   NEUTROABS 2.6 06/14/2020   HGB 12.4 06/14/2020   HCT 39.2 06/14/2020   MCV 104.8 (H) 06/14/2020   PLT 348 06/14/2020      Chemistry      Component Value Date/Time   NA 139 11/16/2013 1404   K 4.0 11/16/2013 1404   CL 102 07/30/2012 1419   CO2 28 11/16/2013 1404   BUN 30.0 (H) 11/16/2013 1404   CREATININE 0.9 11/16/2013 1404      Component Value Date/Time   CALCIUM 9.1 11/16/2013 1404   ALKPHOS 49 11/16/2013 1404   AST 15 11/16/2013 1404   ALT <6 11/16/2013 1404   BILITOT 1.45 (H) 11/16/2013 1404

## 2020-06-15 NOTE — Assessment & Plan Note (Addendum)
The patient has been taking her medications correctly CBC today is normal She is instructed to continue hydroxyurea 1000 mg on Mondays and Fridays and to take 500 mg for the rest of the week She will continue to take aspirin 81 mg daily I explained to the patient and her daughter circumstances that could cause bone marrow suppression including infection, extreme stress and surgery In those situations, she is instructed to call me and we might have to hold her treatment and follow closely Otherwise, I plan to see her back in 6 months

## 2020-06-21 ENCOUNTER — Ambulatory Visit: Payer: Medicare Other | Admitting: Neurology

## 2020-07-10 ENCOUNTER — Other Ambulatory Visit: Payer: Self-pay | Admitting: Neurology

## 2020-08-02 NOTE — Progress Notes (Signed)
Assessment/Plan:   1.  Parkinsons Disease  -Carbidopa/levodopa 25/100, 2 tablets at 7 AM/11 AM/4 PM.  She can use extra if needed.  -Continue carbidopa/levodopa 50/200 at bedtime  -met with lcsw today  -discussed exercise  2.  Severe cervical spinal stenosis  -Surgery was offered by neurosurgery but declined by patient.  She understands risks.  -paresthesias could be from this but sounds like potential PN.  Check b12/tsh  3.  RBD  -Clinically doing well from that aspect.  4.  Post COVID anosmia and dizziness  -Therapy started at the time that she got COVID and really never resolved.  We will try to change around her Parkinson's medications without relief.  Unfortunately, I do not think that this is a Parkinson's issue but rather a post-COVID issue.  She sought a second opinion [facility].  -Certainly, the lisinopril and the Detrol could be contributing to her symptoms of dizziness as well.  I do not think that the levodopa has we have previously tried to change to that to other formulations without success.  5.  Constipation  -discussed nature and pathophysiology and association with PD  -discussed importance of hydration.  Pt is to increase water intake  -pt is given a copy of the rancho recipe  -recommended daily colace  -recommended miralax prn  Subjective:   Carolyn Figueroa was seen today in follow up for Parkinsons disease.  My previous records were reviewed prior to todays visit as well as outside records available to me. Pt denies falls.  She continues to have dizziness.  No hallucinations.  She has seen Dr. Brett Fairy since our last visit for post-COVID symptoms.  Those records are reviewed.  I also reviewed her recent primary care records from April 5.  Also c/o constipation.  Some paresthesias in feet below the knee.  Doesn't happen daily.    ALLERGIES:  No Known Allergies  CURRENT MEDICATIONS:  Outpatient Encounter Medications as of 08/04/2020  Medication Sig  .  aspirin 81 MG tablet Take 81 mg by mouth daily.  . carbidopa-levodopa (SINEMET CR) 50-200 MG tablet TAKE 1 TABLET BY MOUTH AT BEDTIME  . carbidopa-levodopa (SINEMET IR) 25-100 MG tablet Take 2 tablets by mouth 3 (three) times daily.  . hydroxyurea (HYDREA) 500 MG capsule TAKE 2 CAPSULES BY MOUTH ON MONDAY AND FRIDAYS AND 1 CAPSULE BY MOUTH the rest of the week  . meclizine (ANTIVERT) 12.5 MG tablet Take 12.5 mg by mouth 3 (three) times daily as needed for dizziness.  . Multiple Vitamin (MULTIVITAMIN WITH MINERALS) TABS tablet Take 1 tablet by mouth daily.  . sertraline (ZOLOFT) 50 MG tablet Take 0.5 tablets by mouth 2 (two) times daily.  Marland Kitchen tolterodine (DETROL LA) 2 MG 24 hr capsule Take 2 mg by mouth daily.   No facility-administered encounter medications on file as of 08/04/2020.    Objective:   PHYSICAL EXAMINATION:    VITALS:   Vitals:   08/04/20 1519  BP: (!) 132/92  Pulse: 85  SpO2: 98%  Weight: 128 lb (58.1 kg)  Height: '4\' 11"'  (1.499 m)    GEN:  The patient appears stated age and is in NAD. HEENT:  Normocephalic, atraumatic.  The mucous membranes are moist. The superficial temporal arteries are without ropiness or tenderness. CV:  RRR Lungs:  CTAB Neck/HEME:  There are no carotid bruits bilaterally.  Neurological examination:  Orientation: The patient is alert and oriented x3. Cranial nerves: There is good facial symmetry with mild facial hypomimia. The  speech is fluent and clear. She is hypophonic.  Soft palate rises symmetrically and there is no tongue deviation. Hearing is intact to conversational tone. Sensation: Sensation is intact to light touch throughout Motor: Strength is at least antigravity x4.  Movement examination: Tone: There is nl tone in the ue/le Abnormal movements: none Coordination:  There is no decremation with RAM's Gait and Station:The patient's stride length is slightly decreased with decreased arm swing on the L.   I have reviewed and  interpreted the following labs independently    Chemistry      Component Value Date/Time   NA 139 11/16/2013 1404   K 4.0 11/16/2013 1404   CL 102 07/30/2012 1419   CO2 28 11/16/2013 1404   BUN 30.0 (H) 11/16/2013 1404   CREATININE 0.9 11/16/2013 1404      Component Value Date/Time   CALCIUM 9.1 11/16/2013 1404   ALKPHOS 49 11/16/2013 1404   AST 15 11/16/2013 1404   ALT <6 11/16/2013 1404   BILITOT 1.45 (H) 11/16/2013 1404       Lab Results  Component Value Date   WBC 4.2 06/14/2020   HGB 12.4 06/14/2020   HCT 39.2 06/14/2020   MCV 104.8 (H) 06/14/2020   PLT 348 06/14/2020    Lab Results  Component Value Date   TSH 2.86 12/23/2018   No results found for: VITAMINB12 (last checked 2021)   Total time spent on today's visit was 20 minutes, including both face-to-face time and nonface-to-face time.  Time included that spent on review of records (prior notes available to me/labs/imaging if pertinent), discussing treatment and goals, answering patient's questions and coordinating care.  Cc:  [name], MD

## 2020-08-04 ENCOUNTER — Ambulatory Visit (INDEPENDENT_AMBULATORY_CARE_PROVIDER_SITE_OTHER): Payer: Medicare Other | Admitting: Neurology

## 2020-08-04 ENCOUNTER — Other Ambulatory Visit (INDEPENDENT_AMBULATORY_CARE_PROVIDER_SITE_OTHER): Payer: Medicare Other

## 2020-08-04 ENCOUNTER — Other Ambulatory Visit: Payer: Self-pay

## 2020-08-04 ENCOUNTER — Encounter: Payer: Self-pay | Admitting: Neurology

## 2020-08-04 VITALS — BP 132/92 | HR 85 | Ht 59.0 in | Wt 128.0 lb

## 2020-08-04 DIAGNOSIS — R5383 Other fatigue: Secondary | ICD-10-CM | POA: Diagnosis not present

## 2020-08-04 DIAGNOSIS — K5901 Slow transit constipation: Secondary | ICD-10-CM

## 2020-08-04 DIAGNOSIS — E538 Deficiency of other specified B group vitamins: Secondary | ICD-10-CM

## 2020-08-04 DIAGNOSIS — G20 Parkinson's disease: Secondary | ICD-10-CM | POA: Diagnosis not present

## 2020-08-04 LAB — TSH: TSH: 4.59 u[IU]/mL — ABNORMAL HIGH (ref 0.35–4.50)

## 2020-08-04 LAB — VITAMIN B12: Vitamin B-12: 620 pg/mL (ref 211–911)

## 2020-08-04 MED ORDER — CARBIDOPA-LEVODOPA 25-100 MG PO TABS: 2.0000 | ORAL_TABLET | Freq: Three times a day (TID) | ORAL | 1 refills | Status: DC

## 2020-08-04 MED ORDER — CARBIDOPA-LEVODOPA ER 50-200 MG PO TBCR: 1.0000 | EXTENDED_RELEASE_TABLET | Freq: Every day | ORAL | 1 refills | Status: DC

## 2020-08-04 NOTE — Patient Instructions (Addendum)
Your provider has requested that you have labwork completed today. The lab is located on the Second floor at [place], within the [facility] office. When you get off the elevator, turn right and go in the [facility] [address]; the first brown door on the left.  Tell the ladies behind the desk that you are there for lab work. If you are not called within 15 minutes please check with the front desk.   Once you complete your labs you are free to go. You will receive a call or message via MyChart with your lab results.     Constipation and Parkinson's disease:  1.Rancho recipe for constipation in Parkinsons Disease:  -1 cup of unprocessed bran (need to get this at [employer], [employer] or similar type of store), 2 cups of applesauce in 1 cup of prune juice 2.  Increase fiber intake (Metamucil,vegetables) 3.  Regular, moderate exercise can be beneficial. 4.  Avoid medications causing constipation, such as medications like antacids with calcium or magnesium 5.  It's okay to take daily Miralax, and taper if stools become too loose or you experience diarrhea 6.  Stool softeners (Colace) can help with chronic constipation and I recommend you take this daily. 7.  Increase water intake.  You should be drinking 1/2 gallon of water a day as long as you have not been diagnosed with congestive heart failure or renal/kidney failure.  This is probably the single greatest thing that you can do to help your constipation.    T-Shirt design contest   SUBMIT UNIQUE DESIGN TO WIN!  Winning design will be featured on T-shirts to support our local Parkinson's patients.   Contest rules and information in on link below .   Contest ends on August 31,2022    How to [employer] your own unique quote or a design that will stand out and support the ideas behind Parkinson's Awareness  This contest is open to everyone. The winning design or quote will be selected by a group of unbiased  judges. Judges will vote based on quality, relevance, and aesthetic. Judges will not have access to the names associated with the submission. Submissions accepted until: November 23, 2020 What You Win Your own quote or design will be printed on a t-shirt that will support Parkinson's Awareness!! Feel good knowing your quote/artwork will help spread awareness for Parkinson's contribute and 100% of profits to the local area . Tips for Submissions We will accept submissions in the form of quotes, Financial risk analyst, or hand drawn artwork.  Mission/Theme: The Power of One ! Working together to support and treat to improve quality of life for Parkinson's Patients Rules & Regulations All designs and quotes must be original and not subject to copyright of another person or entity. Distribution and Reproduction Rights for all quotes and artwork will be transferred to [facility] [employer] upon submission to the contest via the [employer] form on November 23, 2020  Online Resources for Power over Parkinson's Group May 2022  . Local [place] Online Groups  o Power over [employer] Group :   - Power Over Parkinson's Patient Education Group will be Wednesday, May 11th at 2pm via Zoom.   - Upcoming Power over Parkinson's Meetings:  2nd Wednesdays of the month at 2 pm:  June 8th, July 13th - Contact Amy Marriott at [email or web address] if interested in participating in this online group o Parkinson's Care Partners Group:    3rd Mondays, Contact Misty Paladino o  Atypical Parkinsonian Patient Group:   4th Wednesdays, Contact [place] o If you are interested in participating in these online groups with Misty, please contact her directly for how to join those meetings.  Her contact information is [email or web address].   . [place]:  [email or web address] o PD Health at Home continues:  Mindfulness Mondays, Expert Briefing Tuesdays, Wellness Wednesdays, Take  Time Thursdays, Fitness Fridays -Listings for May 2022 are on the website o Upcoming Webinar:  Newly Diagnosed Building a Better Life with Parkinson   Wednesday, May 18th @ 1 pm o Register for Armed forces operational officer) at [email or web address] o  Please check out their website to sign up for emails and see their full online offerings  . [place]:  [email or web address]  o Upcoming Webinar:   What's in your DNA, understanding Parkinson's genetics.  Thursday, May 19th @ 12 noon o Check out additional information on their website to see their full online offerings  . [place]:  [email or web address] o Upcoming Webinar:  Stay tuned o Care Partner Monthly Meetup.  With [place].  First Tuesday of each month, 2 pm o Joy Breaks:  First Wednesday of each month, 2-3 pm. There will be art, doodling, making, crafting, listening, laughing, stories, and everything in between. No art experience necessary. No supplies required. Just show up for joy!  Register on their website. o Check out additional information to Live Well Today on their website  . Parkinson and Movement Disorders (PMD) Alliance:  www.pmdalliance.org o [employer] Online:  Online Education Events o Sign up for emails, which are sent weekly to give you updates on programming and online offerings     . Parkinson's Association of the [place]:  www.parkinsonassociation.org o Information on online support groups, education events, and online exercises including Yoga, Parkinson's exercises and more-LOTS of information on links to PD resources and online events o Virtual Support Group through Parkinson's Association of the [place]; next one is scheduled for Wednesday, May 4th, 2022 at 2 pm. (These are typically scheduled for the 1st Wednesday of the month at 2 pm).  Visit website for details.  . Additional links for movement activities: o PWR! Moves Classes at [place] RESUMED!  Wednesdays 10 and 11 am.  Contact Amy Marriott, PT [email or web address] or [phone] if interested o Here is a link to the PWR!Moves classes on Zoom from [phone] - Daily Mon-Sat at 10:00. Via Zoom, FREE and open to all.  There is also a link below via Facebook if you use that platform. - [place] - [email or web address]://[email or web address] o Parkinson's Wellness Recovery (PWR! Moves)  www.pwr4life.org - Info on the PWR! Virtual Experience:  You will have access to our expertise through self-assessment, guided plans that start with the PD-specific fundamentals, educational content, tips, Q&A with an expert, and a growing PrepaidParty.no of PD-specific pre-recorded and live exercise classes of varying types and intensity - both physical and cognitive! If that is not enough, we offer 1:1 wellness consultations (in-person or virtual) to personalize your PWR! Art therapist.  - Check out the PWR! Move of the month on the [place] Recovery website:  [address] o [email or web address] Fridays:  - As part of the PD Health @ Home program, this free video series focuses each week on one aspect of fitness designed to support people living with Parkinson's.  These weekly videos highlight the [place] recent fitness guidelines for people with Parkinson's disease. -  [address] o Dance for PD website  is offering free, live-stream classes throughout the week, as well as links to [employer] of classes:  [email or web address] o Dance for Parkinson's Class:  [place].  Free offering for people with Parkinson's and care  partners; virtual class.  o For more information, contact [phone] or email Ruffin Frederick at [email or web address] o Virtual dance and Pilates for Parkinson's classes: Click on the Community Tab> Parkinson's Movement Initiative Tab.  To register for classes and for more information, visit [email or web address] and click the "community" tab.     o [employer] Parkinson's Cycling Classes  - [employer]: 1pm on Fridays-Live classes at [employer] ([employer] at [email or web address] or [phone]) [facility] [employer]: Virtual Classes Mondays and Thursdays Jeanette Caprice classes Tuesday, Wednesday and Thursday (contact [place] at [email or web address]  or [phone])  o [place] levels of classes are offered Tuesdays and Thursdays:  10:30 am,  12 noon & 1:45 pm at [facility].  - Active Stretching with Paula Compton Class starting in March, on Fridays - To observe a class or for  more information, call [phone] or email [email or web address] . Well[employer]: o Chief Technology Officer Opportunities:  www.well-[email or web address]/caregiver-education/caregiver-support-group.  You may also contact Vickki Muff at [email or web address] or [phone].   o [facility] for Family Caregivers! Thursday, May 12th 10:00a-1:30p [place] 04-22-1999, [employer], [place], [place] You may contact [address] at [email or web address] or [phone].   o Well-Spring Navigator:  Just1Navigator program, a free service to help individuals and families through the journey of determining care for older adults.  The "Navigator" is a 05-26-1989, [phone], who will speak with a prospective client and/or loved ones to provide an assessment of the situation and a set of recommendations for a personalized care plan -- all free of charge, and whether [facility] [employer] offers the needed  service or not. If the need is not a service we provide, we are well-connected with reputable programs in town that we can refer you to.  www.well-[email or web address] or to speak with the Navigator, call [phone].

## 2020-08-08 ENCOUNTER — Other Ambulatory Visit: Payer: Self-pay

## 2020-08-08 ENCOUNTER — Telehealth: Payer: Self-pay | Admitting: Neurology

## 2020-08-08 NOTE — Telephone Encounter (Signed)
Pharmacy is saying that they got one script and not the other, pt family is going to call the pharmacy back and see if they have it, if not she is gong to call us back, pt family state that Carolyn Figueroa is always mixing up the pt carbidopa levodopa and they have to correct them when getting it refilled, if she is still having trouble is will call the office back for [place] to call the pharmacy or refax the order if we need too.

## 2020-08-08 NOTE — Telephone Encounter (Signed)
  1. Which medications need to be refilled? (please list name of each medication and dose if known) carbidopa-levodopa extended release  2. Which pharmacy/location (including street and city if local pharmacy) is medication to be sent to? [facility] [address]  3. Do they need a 30 day or 90 day supply? 90 day supply

## 2020-08-10 ENCOUNTER — Telehealth: Payer: Self-pay | Admitting: Neurology

## 2020-08-10 NOTE — Telephone Encounter (Signed)
New message    Daughter Rodena Piety calling for test results.

## 2020-08-11 NOTE — Telephone Encounter (Signed)
Patient's daughter called back in to get the results. She stated it is ok to leave details on her voicemail if she cannot answer.

## 2020-08-11 NOTE — Telephone Encounter (Signed)
Per pt daughter detail message left on her vm.

## 2020-08-11 NOTE — Telephone Encounter (Signed)
LMoVM for pt to call the office back.

## 2020-08-11 NOTE — Telephone Encounter (Signed)
B12 was normal.  Thyroid function may be a tad off but I didn't do a full thyroid panel, which would need to be done.  B/c of that I did have [place] fax a copy of her labs to PCP several days ago and they may want to get an appt to discuss

## 2020-08-15 ENCOUNTER — Other Ambulatory Visit: Payer: Self-pay

## 2020-08-15 NOTE — Telephone Encounter (Signed)
Patient's daughter has been having trouble with [facility] refilling the patient's extended release carbidopa-levodopa. They "just don't understand the difference between them". She would like the prescription to be sent to [facility].

## 2020-08-15 NOTE — Telephone Encounter (Signed)
Yes, cancel out old RX's then before sending

## 2020-08-15 NOTE — Telephone Encounter (Signed)
Patient's daughter called back in to follow up about getting prescriptions sent to [facility]

## 2020-08-16 MED ORDER — CARBIDOPA-LEVODOPA ER 50-200 MG PO TBCR: 1.0000 | EXTENDED_RELEASE_TABLET | Freq: Every day | ORAL | 1 refills | Status: DC

## 2020-08-16 MED ORDER — CARBIDOPA-LEVODOPA 25-100 MG PO TABS
2.0000 | ORAL_TABLET | Freq: Three times a day (TID) | ORAL | 1 refills | Status: DC
Start: 2020-08-16 — End: 2020-12-12

## 2020-09-22 ENCOUNTER — Ambulatory Visit: Payer: Medicare Other | Admitting: Neurology

## 2020-11-16 ENCOUNTER — Ambulatory Visit: Payer: Medicare Other | Admitting: Neurology

## 2020-12-06 ENCOUNTER — Telehealth: Payer: Self-pay

## 2020-12-06 NOTE — Telephone Encounter (Signed)
Called and spoke with daughter. Appts moved to a earlier time. She is aware of appt times.

## 2020-12-06 NOTE — Telephone Encounter (Signed)
-----   Message from Heath Lark, MD sent at 12/06/2020  7:55 AM EDT ----- Pls call her or daughter I have to leave work by 315 pm so I have to wrap up by 3 pm If she can come in earlier and be seen at 245 pm I can make it work on the day she is scheduled If not, we have move her appt a bit earlier

## 2020-12-12 ENCOUNTER — Other Ambulatory Visit: Payer: Self-pay | Admitting: Neurology

## 2020-12-12 DIAGNOSIS — G20 Parkinson's disease: Secondary | ICD-10-CM

## 2020-12-13 ENCOUNTER — Ambulatory Visit: Payer: Medicare Other | Admitting: Hematology and Oncology

## 2020-12-13 ENCOUNTER — Other Ambulatory Visit: Payer: Medicare Other

## 2020-12-27 ENCOUNTER — Other Ambulatory Visit: Payer: Self-pay

## 2020-12-27 ENCOUNTER — Other Ambulatory Visit: Payer: Medicare Other

## 2020-12-27 ENCOUNTER — Ambulatory Visit: Payer: Medicare Other | Admitting: Hematology and Oncology

## 2020-12-27 ENCOUNTER — Inpatient Hospital Stay: Payer: Medicare Other

## 2020-12-27 ENCOUNTER — Inpatient Hospital Stay: Payer: Medicare Other | Attending: Hematology and Oncology

## 2020-12-27 ENCOUNTER — Inpatient Hospital Stay (HOSPITAL_BASED_OUTPATIENT_CLINIC_OR_DEPARTMENT_OTHER): Payer: Medicare Other | Admitting: Hematology and Oncology

## 2020-12-27 VITALS — BP 185/106 | HR 83 | Temp 97.4°F | Resp 18 | Ht 59.0 in | Wt 130.2 lb

## 2020-12-27 DIAGNOSIS — D45 Polycythemia vera: Secondary | ICD-10-CM | POA: Diagnosis present

## 2020-12-27 DIAGNOSIS — Z7982 Long term (current) use of aspirin: Secondary | ICD-10-CM | POA: Insufficient documentation

## 2020-12-27 DIAGNOSIS — G20 Parkinson's disease: Secondary | ICD-10-CM | POA: Diagnosis not present

## 2020-12-27 DIAGNOSIS — D649 Anemia, unspecified: Secondary | ICD-10-CM | POA: Insufficient documentation

## 2020-12-27 DIAGNOSIS — Z23 Encounter for immunization: Secondary | ICD-10-CM

## 2020-12-27 DIAGNOSIS — Z9181 History of falling: Secondary | ICD-10-CM | POA: Diagnosis not present

## 2020-12-27 LAB — CBC WITH DIFFERENTIAL/PLATELET
Abs Immature Granulocytes: 0.03 10*3/uL (ref 0.00–0.07)
Basophils Absolute: 0 10*3/uL (ref 0.0–0.1)
Basophils Relative: 1 %
Eosinophils Absolute: 0.2 10*3/uL (ref 0.0–0.5)
Eosinophils Relative: 4 %
HCT: 36.4 % (ref 36.0–46.0)
Hemoglobin: 11.8 g/dL — ABNORMAL LOW (ref 12.0–15.0)
Immature Granulocytes: 1 %
Lymphocytes Relative: 38 %
Lymphs Abs: 1.5 10*3/uL (ref 0.7–4.0)
MCH: 33.6 pg (ref 26.0–34.0)
MCHC: 32.4 g/dL (ref 30.0–36.0)
MCV: 103.7 fL — ABNORMAL HIGH (ref 80.0–100.0)
Monocytes Absolute: 0.3 10*3/uL (ref 0.1–1.0)
Monocytes Relative: 7 %
Neutro Abs: 2 10*3/uL (ref 1.7–7.7)
Neutrophils Relative %: 49 %
Platelets: 332 10*3/uL (ref 150–400)
RBC: 3.51 MIL/uL — ABNORMAL LOW (ref 3.87–5.11)
RDW: 14.6 % (ref 11.5–15.5)
WBC: 4 10*3/uL (ref 4.0–10.5)
nRBC: 0 % (ref 0.0–0.2)

## 2020-12-27 MED ORDER — INFLUENZA VAC A&B SA ADJ QUAD 0.5 ML IM PRSY
0.5000 mL | PREFILLED_SYRINGE | Freq: Once | INTRAMUSCULAR | Status: AC
  Administered 2020-12-27: 0.5 mL via INTRAMUSCULAR
  Filled 2020-12-27: qty 0.5

## 2020-12-28 ENCOUNTER — Encounter: Payer: Self-pay | Admitting: Hematology and Oncology

## 2020-12-28 NOTE — Progress Notes (Signed)
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[facility]) The patient has been taking her medications correctly CBC today is close to normal except for very mild anemia For now, I do not recommend any changes to the dose of hydroxyurea She is instructed to continue hydroxyurea 1000 mg on Mondays and Fridays and to take 500 mg for the rest of the week She will continue to take aspirin 81 mg daily I explained to the patient and her daughter circumstances that could cause bone marrow suppression including infection, extreme stress and surgery In those situations, she is instructed to call me and we might have to hold her treatment and follow closely Otherwise, I plan to see her back in 6 months  Risk for falls The patient has significant clinical deterioration since last time I saw her She stumbles when she walk and she is weak According to her daughter, she is afraid to walk and has withdrawn from a lot of physical activity I see signs of muscle atrophy I recommend aggressive physical therapy and rehab and she is in agreement  Orders Placed This Encounter  Procedures   Ambulatory referral to Physical Therapy    Referral Priority:   Routine    Referral Type:   Physical Medicine    Referral Reason:   Specialty Services Required    Requested Specialty:   Physical Therapy    Number of Visits Requested:   1    All questions were answered. The patient knows to call the clinic with any problems, questions or concerns. The total time spent in the appointment was 20 minutes encounter with patients including review of chart and  various tests results, discussions about plan of care and coordination of care plan   Heath Lark, MD 12/28/2020 11:04 AM  INTERVAL HISTORY: Please see below for problem oriented charting. she returns for treatment follow-up on hydroxyurea for polycythemia vera She is compliant taking her medications Denies recent infection The patient complain of dizziness, gait imbalance and weakness According to the daughter, the patient has stopped going to the gym She is afraid to walk She did not report any recent falls She felt weak overall  REVIEW OF SYSTEMS:   Constitutional: Denies fevers, chills or abnormal weight loss Eyes: Denies blurriness of vision Ears, nose, mouth, throat, and face: Denies mucositis or sore throat Respiratory: Denies cough, dyspnea or wheezes Cardiovascular: Denies palpitation, chest discomfort or lower extremity swelling Gastrointestinal:  Denies nausea, heartburn or change in bowel habits Skin: Denies abnormal skin rashes Lymphatics: Denies new lymphadenopathy or easy bruising Behavioral/Psych: Mood is stable, no new changes  All other systems were reviewed with the patient and are negative.  I have reviewed the past medical history, past surgical history, social history and family history with the patient and they are unchanged from previous note.  ALLERGIES:  has No Known Allergies.  MEDICATIONS:  Current Outpatient Medications  Medication Sig Dispense Refill   aspirin 81 MG tablet Take 81 mg by mouth daily.     carbidopa-levodopa (SINEMET CR) 50-200 MG tablet Take 1 tablet by mouth at bedtime. 90 tablet 1   carbidopa-levodopa (SINEMET IR) 25-100 MG tablet TAKE TWO TABLETS BY  MOUTH THREE TIMES A DAY 540 tablet 1   hydroxyurea (HYDREA) 500 MG capsule TAKE 2 CAPSULES BY MOUTH ON MONDAY AND FRIDAYS AND 1 CAPSULE BY MOUTH the rest of the week 100 capsule 6   meclizine (ANTIVERT) 12.5 MG tablet Take 12.5 mg by mouth 3 (three) times daily as needed for dizziness.      Multiple Vitamin (MULTIVITAMIN WITH MINERALS) TABS tablet Take 1 tablet by mouth daily.     sertraline (ZOLOFT) 50 MG tablet Take 0.5 tablets by mouth 2 (two) times daily.     tolterodine (DETROL LA) 2 MG 24 hr capsule Take 2 mg by mouth daily.     No current facility-administered medications for this visit.    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 The patient never had bone marrow aspirate and biopsy.   09/30/2013 -  Chemotherapy   On 09/30/2013, hydroxyurea dose was increased to 1000 mg on Mondays, Wednesdays and Fridays and 2 take 500 mg the rest of the week. On 10/22/2013, the dose of hydroxyurea was increased to 1000 mg daily along with phlebotomy. On August 2015, the dose of hydroxyurea was modified to 1000 mg daily Mondays to Fridays and 500 mg on Saturdays and Sunday.  However, the patient was not following instruction correctly. On 07/20/2014, the dose of hydroxyurea was modifed to 500 mg daily Mondays to Fridays and 1000 mg on Saturdays and Sundays On 02/08/2015, the dose of hydroxyurea was modified to 500 milligrams daily Mondays to Saturdays and 1000 mg on Sundays. She was referred to neurologist in 2016 and was diagnosed with Parkinson's disease On 07/17/2016, the dose of hydroxyurea is adjusted.  She takes 500 mg daily except for 2 days a week on Wednesdays and Sundays she take 1000 mg Starting 11/14/2016, the dose of hydroxyurea is suggested.  She is instructed to take 1000 mg on Mondays, Wednesdays and Fridays and take 500 mg daily for the rest of the week Starting 12/18/2019, the dose of hydroxyurea is changed.  She is instructed to take 1000 mg on Mondays and Fridays and take 500 mg daily for the rest of the  week         PHYSICAL EXAMINATION: ECOG PERFORMANCE STATUS: 2 - Symptomatic, <50% confined to bed  Vitals:   12/27/20 1431  BP: (!) 185/106  Pulse: 83  Resp: 18  Temp: (!) 97.4 F (36.3 C)  SpO2: 100%   Filed Weights   12/27/20 1431  Weight: 130 lb 3.2 oz (59.1 kg)    GENERAL:alert, no distress and comfortable SKIN: skin color, texture, turgor are normal, no rashes or significant lesions EYES: normal, Conjunctiva are pink and non-injected, sclera clear OROPHARYNX:no exudate, no erythema and lips, buccal mucosa, and tongue normal  NECK: supple, thyroid normal size, non-tender, without nodularity LYMPH:  no palpable lymphadenopathy in the cervical, axillary or inguinal LUNGS: clear to auscultation and percussion with normal breathing effort HEART: regular rate & rhythm and no murmurs and no lower extremity edema ABDOMEN:abdomen soft, non-tender and normal bowel sounds Musculoskeletal:no cyanosis of digits and no clubbing  NEURO: alert & oriented x 3 with fluent speech, noted signs of muscle atrophy and gait instability  LABORATORY DATA:  I have reviewed the data as listed    Component Value Date/Time  NA 139 11/16/2013 1404   K 4.0 11/16/2013 1404   CL 102 07/30/2012 1419   CO2 28 11/16/2013 1404   GLUCOSE 89 11/16/2013 1404   GLUCOSE 102 (H) 07/30/2012 1419   BUN 30.0 (H) 11/16/2013 1404   CREATININE 0.9 11/16/2013 1404   CALCIUM 9.1 11/16/2013 1404   PROT 6.7 11/16/2013 1404   ALBUMIN 4.0 11/16/2013 1404   AST 15 11/16/2013 1404   ALT <6 11/16/2013 1404   ALKPHOS 49 11/16/2013 1404   BILITOT 1.45 (H) 11/16/2013 1404   GFRNONAA 58 (L) 11/22/2009 1855   GFRAA  11/22/2009 1855    >60        The eGFR has been calculated using the MDRD equation. This calculation has not been validated in all clinical situations. eGFR's persistently <60 mL/min signify possible Chronic Kidney Disease.    No results found for: SPEP, UPEP  Lab Results  Component Value  Date   WBC 4.0 12/27/2020   NEUTROABS 2.0 12/27/2020   HGB 11.8 (L) 12/27/2020   HCT 36.4 12/27/2020   MCV 103.7 (H) 12/27/2020   PLT 332 12/27/2020      Chemistry      Component Value Date/Time   NA 139 11/16/2013 1404   K 4.0 11/16/2013 1404   CL 102 07/30/2012 1419   CO2 28 11/16/2013 1404   BUN 30.0 (H) 11/16/2013 1404   CREATININE 0.9 11/16/2013 1404      Component Value Date/Time   CALCIUM 9.1 11/16/2013 1404   ALKPHOS 49 11/16/2013 1404   AST 15 11/16/2013 1404   ALT <6 11/16/2013 1404   BILITOT 1.45 (H) 11/16/2013 1404

## 2020-12-28 NOTE — Assessment & Plan Note (Signed)
The patient has been taking her medications correctly CBC today is close to normal except for very mild anemia For now, I do not recommend any changes to the dose of hydroxyurea She is instructed to continue hydroxyurea 1000 mg on Mondays and Fridays and to take 500 mg for the rest of the week She will continue to take aspirin 81 mg daily I explained to the patient and her daughter circumstances that could cause bone marrow suppression including infection, extreme stress and surgery In those situations, she is instructed to call me and we might have to hold her treatment and follow closely Otherwise, I plan to see her back in 6 months

## 2020-12-28 NOTE — Assessment & Plan Note (Signed)
The patient has significant clinical deterioration since last time I saw her She stumbles when she walk and she is weak According to her daughter, she is afraid to walk and has withdrawn from a lot of physical activity I see signs of muscle atrophy I recommend aggressive physical therapy and rehab and she is in agreement

## 2021-01-11 ENCOUNTER — Other Ambulatory Visit: Payer: Self-pay

## 2021-01-11 ENCOUNTER — Encounter: Payer: Self-pay | Admitting: Physical Therapy

## 2021-01-11 ENCOUNTER — Ambulatory Visit: Payer: Medicare Other | Attending: Hematology and Oncology | Admitting: Physical Therapy

## 2021-01-11 DIAGNOSIS — Z9181 History of falling: Secondary | ICD-10-CM | POA: Insufficient documentation

## 2021-01-11 DIAGNOSIS — R2681 Unsteadiness on feet: Secondary | ICD-10-CM | POA: Diagnosis not present

## 2021-01-11 DIAGNOSIS — R262 Difficulty in walking, not elsewhere classified: Secondary | ICD-10-CM | POA: Diagnosis present

## 2021-01-11 DIAGNOSIS — G20 Parkinson's disease: Secondary | ICD-10-CM | POA: Diagnosis not present

## 2021-01-11 DIAGNOSIS — M6281 Muscle weakness (generalized): Secondary | ICD-10-CM | POA: Insufficient documentation

## 2021-01-11 NOTE — Therapy (Signed)
[place]. [place], [place], [address] Phone: [phone]   Fax:  [phone]  Physical Therapy Evaluation  Patient Details  Name: Carolyn Figueroa MRN: [id number] Date of Birth: [birth year] Referring Provider (PT): Heath Lark   Encounter Date: 01/11/2021   PT End of Session - 01/11/21 1922     Visit Number 1    Number of Visits 17    Date for PT Re-Evaluation 03/08/21    PT Start Time 5726    PT Stop Time 1802    PT Time Calculation (min) 50 min    Activity Tolerance Patient limited by fatigue;Patient limited by pain    Behavior During Therapy [facility] for tasks assessed/performed             Past Medical History:  Diagnosis Date   Colon polyp 05/2003   Depression with anxiety    HTN (hypertension)    Insomnia    Need for prophylactic vaccination and inoculation against influenza 01/14/2013   Polycythemia    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 She has received PT in the recent past for vestibular issues.    Patient is accompained by: Family member    Pertinent History long haul Covid along with Parkinson's, and polycythemia.    Limitations Standing;Walking;House hold activities;Lifting    How long can you stand comfortably? Fluctuates, but when dizzy, cannot remain standing    How long can you walk comfortably? Can walk in the house.    Patient Stated Goals Increase activity tolerance    Currently in Pain? No/denies   She reports chronic neuropathy pain and chronic back pain due to spinal issues per Dr.                Hulan Fess PT Assessment - 01/11/21 0001       Assessment   Medical Diagnosis Polycythemis, long haul Covid, Parkinsons    Referring Provider (PT) Ni Gorsuch      Balance Screen   Has the patient fallen in the past 6 months No    Has the patient had a decrease in activity level because of a fear of falling?  Yes    Is the patient reluctant to leave their home because of a fear of falling?  Yes      [place];Other relatives    Available Help at Discharge Family    Type of [place] to enter   3 steps with wall for support   Entrance Stairs-Number of Steps 3    Entrance Stairs-Rails None    Home Layout Two level    Alternate Level Stairs-Number of Steps 14    Alternate Level Stairs-Rails Can reach both    Home Equipment Shower seat    Additional Comments Wanting to purchase a rollator      Prior Function   Level of Independence Independent with gait;Needs assistance with ADLs    Leisure walk, read,  restorator      Cognition   Overall Cognitive Status Within Functional Limits for tasks assessed      Sensation   Additional Comments Tingling in B lower legs due to PN.      Functional Tests   Functional tests Sit to Stand      Sit to Stand   Comments 5times in 50 econds      Posture/Postural Control   Posture Comments flexed posture, forward head, elevated shoulders      ROM / Strength   AROM / PROM / Strength AROM;Strength      AROM   Overall AROM  Deficits;Due to pain    Overall AROM Comments BUE ROM WFL, B shoulder flexion mildly impaired. BLE generally WFL, but limited due to pain.      Strength   Overall Strength Comments BUE strenght grossly 3+/5    Strength Assessment Site Hip;Knee;Ankle    Right/Left Hip Right;Left    Right Hip Flexion 3/5    Right Hip Extension 3-/5    Right Hip ABduction 3-/5    Left Hip Flexion 3/5    Left Hip Extension 3-/5     Left Hip ABduction 3-/5    Right/Left Knee Right;Left    Right Knee Flexion 3+/5    Right Knee Extension 3+/5    Left Knee Flexion 3+/5    Left Knee Extension 3+/5    Right/Left Ankle Right;Left    Right Ankle Dorsiflexion 3+/5    Right Ankle Plantar Flexion 3/5    Right Ankle Inversion 3+/5    Right Ankle Eversion 3-/5    Left Ankle Dorsiflexion 3+/5    Left Ankle Plantar Flexion 3-/5    Left Ankle Inversion 3+/5    Left Ankle Eversion 3+/5      Bed Mobility   Bed Mobility Supine to Sit;Sit to Supine    Supine to Sit Minimal Assistance - Patient > 75%    Sit to Supine Minimal Assistance - Patient > 75%      Transfers   Five time sit to stand comments  Uses hands, fatigues quickly 50 seconds      Ambulation/Gait   Ambulation/Gait Yes    Ambulation/Gait Assistance 7: Independent    Ambulation Distance (Feet) 80 Feet    Gait Pattern Step-through pattern;Decreased step length - right;Decreased step length - left;Decreased stance time - right;Decreased stance time - left;Decreased stride length;Decreased dorsiflexion - right;Decreased dorsiflexion - left;Decreased weight shift to right;Decreased weight shift to left;Shuffle    Ambulation Surface Level    Gait Comments Physician dered Rollator.      Balance   Balance Assessed Yes    Balance comment Patient stands statically without AD, unsteady with weight shifts.                        Objective measurements completed on examination: See above findings.                PT Education - 01/11/21 1921     Education Details HEP, POC    Person(s) Educated Patient;Child(ren)    Methods Explanation;Demonstration    Comprehension Verbalized understanding              PT Short Term Goals - 01/11/21 1928       PT SHORT TERM GOAL #1   Title I with basic HEP    Baseline Initiated    Time 3    Period Weeks    Status  New    Target Date 02/01/21               PT Long Term Goals - 01/11/21  1929       PT LONG TERM GOAL #1   Title I with advanced HEP program    Baseline initiated    Time 8    Period Weeks    Status New    Target Date 03/08/21      PT LONG TERM GOAL #2   Title Improve 5 x sit to stand test to <30 seconds    Baseline 50 seconds    Time 8    Period Weeks    Status New    Target Date 03/08/21      PT LONG TERM GOAL #3   Title Perform TUG in < 25 seconds.    Baseline Unable to complete.    Time 8    Period Weeks    Status New    Target Date 03/08/21      PT LONG TERM GOAL #4   Title MI with all bed mobility and transfers    Baseline Up to min A    Time 8    Period Weeks    Status New    Target Date 03/08/21      PT LONG TERM GOAL #5   Title Patient will ambulate at least 500' with LRAD and MI    Baseline 80', no AD, unsteady and very slow, S    Time 8    Period Weeks    Status New    Target Date 03/08/21                    Plan - 01/11/21 1923     Clinical Impression Statement Patient presents after recent decline in strength and activity level, fear of falling, with decreased functional moiblity. She demonstrates weakness, decreased balance, decreased activity tolerance, decreased safety and I with all functional mobility.    Personal Factors and Comorbidities Behavior Pattern;Past/Current Experience;Fitness;Age;Comorbidity 3+    Comorbidities Parkinsons, long haul Covid, polycythemia    Examination-Activity Limitations Bathing;Locomotion Level;Lift;Stand;Bed Mobility;Bend;Sleep;Squat;Dressing;Stairs    Examination-Participation Restrictions Meal Prep;Cleaning;Driving;Community Activity    Stability/Clinical Decision Making Evolving/Moderate complexity    Clinical Decision Making Moderate    Rehab Potential Good    PT Frequency 2x / week    PT Duration 8 weeks    PT Treatment/Interventions ADLs/Self Care Home Management;Moist Heat;Iontophoresis 4mg /ml Dexamethasone;Gait training;Stair training;Functional mobility  training;Therapeutic activities;Therapeutic exercise;Patient/family education;Neuromuscular re-education;Balance training    PT Next Visit Plan FOTO, HEP update, stretch and strengthen for trunk and BLE             Patient will benefit from skilled therapeutic intervention in order to improve the following deficits and impairments:  Pain, Decreased mobility, Postural dysfunction, Decreased strength, Decreased range of motion, Decreased endurance, Decreased activity tolerance, Decreased balance, Difficulty walking, Impaired flexibility  Visit Diagnosis: Unsteadiness on feet  Difficulty in walking, not elsewhere classified  Muscle weakness (generalized)     Problem List Patient Active Problem List   Diagnosis Date Noted   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   History of Parkinson's disease 05/16/2020   Dizziness 02/29/2020   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 02/29/2020   History of COVID-19 02/29/2020   Anemia due to antineoplastic chemotherapy 12/18/2019   Preventive measure 12/23/2018   Needs flu shot 12/03/2017   Risk for falls 11/30/2015   Essential hypertension 07/19/2015   Parkinson  disease ([place]) 07/19/2015   Weight loss 11/16/2013   Tremor 11/16/2013   Elevated BUN 08/20/2013   Leg pain 08/20/2013   Need for prophylactic vaccination and inoculation against influenza 01/14/2013   Insomnia 09/23/2012   Low bone mass 09/23/2012   Mixed incontinence 09/23/2012   Vitamin D deficiency 09/23/2012   Polycythemia vera ([place]) 08/15/2011   Depression with anxiety    Polycythemia    Thrombocytosis     Marcelina Morel, DPT 01/11/2021, 7:33 PM  [place]. [place], [place], [address] Phone: [phone]   Fax:  [phone]  Name: Dezarai Prew MRN: [id number] Date of Birth: 1946/03/23

## 2021-01-11 NOTE — Patient Instructions (Signed)
Access Code: Cyndie Mull URL: [email or web address] Date: 01/11/2021 Prepared by: Ethel Rana  Exercises Supine Bridge - 1 x daily - 7 x weekly - 1 sets - 10 reps Supine Straight Leg Raises - 1 x daily - 7 x weekly - 1 sets - 10 reps Supine Hip Abduction - 1 x daily - 7 x weekly - 1 sets - 10 reps

## 2021-01-13 ENCOUNTER — Telehealth: Payer: Self-pay

## 2021-01-13 NOTE — Telephone Encounter (Signed)
Faxed order for walker to [facility] at [phone], reviewed confirmation.  Called daughter back and given above info and [place] phone # [phone]. She verbalized understanding.

## 2021-01-13 NOTE — Telephone Encounter (Signed)
Daughter called and left a message. Her Mom saw PT and are recommending rollator walker. She is asking for a order.

## 2021-01-16 ENCOUNTER — Encounter: Payer: Self-pay | Admitting: Physical Therapy

## 2021-01-16 ENCOUNTER — Ambulatory Visit: Payer: Medicare Other | Admitting: Physical Therapy

## 2021-01-16 ENCOUNTER — Telehealth: Payer: Self-pay

## 2021-01-16 ENCOUNTER — Other Ambulatory Visit: Payer: Self-pay

## 2021-01-16 DIAGNOSIS — R2681 Unsteadiness on feet: Secondary | ICD-10-CM

## 2021-01-16 DIAGNOSIS — R262 Difficulty in walking, not elsewhere classified: Secondary | ICD-10-CM

## 2021-01-16 DIAGNOSIS — M6281 Muscle weakness (generalized): Secondary | ICD-10-CM

## 2021-01-16 NOTE — Telephone Encounter (Signed)
Pt's daughter, Rodena Piety called requesting Rx be corrected and sent to [facility], specifying a rolling walker with seat and 4 wheels. RX faxed to [phone] per Dr Alvy Bimler. Daughter aware and verbalized thanks

## 2021-01-16 NOTE — Therapy (Signed)
[place] [place], [place], [address] Phone: [phone]   Fax:  [phone]  Physical Therapy Treatment  Patient Details  Name: Carolyn Figueroa MRN: [id number] Date of Birth: January 29, 1946 Referring Provider (PT): Heath Lark   Encounter Date: 01/16/2021   PT End of Session - 01/16/21 1423     Visit Number 2    Number of Visits 17    Date for PT Re-Evaluation 03/08/21    PT Start Time 4403    PT Stop Time 4742    PT Time Calculation (min) 45 min    Activity Tolerance Patient tolerated treatment well;Patient limited by fatigue    Behavior During Therapy [facility] for tasks assessed/performed             Past Medical History:  Diagnosis Date   Colon polyp 05/2003   Depression with anxiety    HTN (hypertension)    Insomnia    Need for prophylactic vaccination and inoculation against influenza 01/14/2013   Polycythemia    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   Subjective Assessment - 01/16/21 1342     Subjective "Very tired, and low energy"    Currently in Pain? Yes    Pain Score 5     Pain Location Back    Pain Orientation Right;Lower                               [facility] Adult PT Treatment/Exercise - 01/16/21 0001       Exercises   Exercises Knee/Hip;Lumbar      Lumbar Exercises: Seated   Other Seated Lumbar Exercises rows red 2x15      Knee/Hip Exercises: Aerobic   Nustep L2 x 5 min      Knee/Hip Exercises: Standing   Forward Step Up Both;2 sets;5 reps;Hand Hold: 2;Step Height: 4"   at stairs     Knee/Hip Exercises: Seated   Long Arc Quad Strengthening;Both;2 sets;10 reps    Long Arc Quad Weight 2 lbs.    Ball Squeeze  2x10    Marching Both;Strengthening;2 sets;10 reps;Weights    Marching Weights 2 lbs.    Hamstring Curl Strengthening;Both;2 sets;10 reps    Hamstring Limitations red tband    Sit to Sand 2 sets;without UE support;5 reps                       PT Short Term Goals - 01/11/21 1928       PT SHORT TERM GOAL #1   Title I with basic HEP    Baseline Initiated    Time 3    Period Weeks    Status New    Target Date 02/01/21               PT Long Term Goals - 01/11/21 1929       PT LONG TERM GOAL #1   Title I with advanced HEP program    Baseline initiated    Time 8    Period Weeks    Status New    Target Date 03/08/21  PT LONG TERM GOAL #2   Title Improve 5 x sit to stand test to <30 seconds    Baseline 50 seconds    Time 8    Period Weeks    Status New    Target Date 03/08/21      PT LONG TERM GOAL #3   Title Perform TUG in < 25 seconds.    Baseline Unable to complete.    Time 8    Period Weeks    Status New    Target Date 03/08/21      PT LONG TERM GOAL #4   Title MI with all bed mobility and transfers    Baseline Up to min A    Time 8    Period Weeks    Status New    Target Date 03/08/21      PT LONG TERM GOAL #5   Title Patient will ambulate at least 500' with LRAD and MI    Baseline 80', no AD, unsteady and very slow, S    Time 8    Period Weeks    Status New    Target Date 03/08/21                   Plan - 01/16/21 1423     Clinical Impression Statement Pt enters clinic with daughter present reporting increase fatigue. Daughter stated that pt did not want to come to therapy so intensity kept to a minium. Cue needed complete full ROM with LAQ and HS curls. Cues needed to prevent posterior leaning with seated rows. Mod ces needed to maintain sequence with sit to stands.    Personal Factors and Comorbidities Behavior Pattern;Past/Current Experience;Fitness;Age;Comorbidity 3+    Comorbidities Parkinsons, long haul Covid,  polycythemia    Examination-Activity Limitations Bathing;Locomotion Level;Lift;Stand;Bed Mobility;Bend;Sleep;Squat;Dressing;Stairs    Examination-Participation Restrictions Meal Prep;Cleaning;Driving;Community Activity    Stability/Clinical Decision Making Evolving/Moderate complexity    Rehab Potential Good    PT Duration 8 weeks    PT Treatment/Interventions ADLs/Self Care Home Management;Moist Heat;Iontophoresis 4mg /ml Dexamethasone;Gait training;Stair training;Functional mobility training;Therapeutic activities;Therapeutic exercise;Patient/family education;Neuromuscular re-education;Balance training    PT Next Visit Plan FOTO, HEP, stretch and strengthen for trunk and BLE    PT Home Exercise Plan Access Code: [email or web address]  URL: [email or web address]  Date: 01/16/2021  Prepared by: Cheri Fowler    Exercises  Standing Row with Anchored Resistance - 1 x daily - 7 x weekly - 3 sets - 10 reps  Shoulder Extension with Resistance - 1 x daily - 7 x weekly - 3 sets - 10 reps             Patient will benefit from skilled therapeutic intervention in order to improve the following deficits and impairments:  Pain, Decreased mobility, Postural dysfunction, Decreased strength, Decreased range of motion, Decreased endurance, Decreased activity tolerance, Decreased balance, Difficulty walking, Impaired flexibility  Visit Diagnosis: Unsteadiness on feet  Difficulty in walking, not elsewhere classified  Muscle weakness (generalized)     Problem List Patient Active Problem List   Diagnosis Date Noted   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   History of Parkinson's disease 05/16/2020   Dizziness 02/29/2020   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 02/29/2020   History of COVID-19 02/29/2020   Anemia due to antineoplastic chemotherapy 12/18/2019   Preventive measure 12/23/2018   Needs flu shot 12/03/2017   Risk for falls 11/30/2015   Essential hypertension 07/19/2015    Parkinson disease ([place]) 07/19/2015   Weight loss 11/16/2013  Tremor 11/16/2013   Elevated BUN 08/20/2013   Leg pain 08/20/2013   Need for prophylactic vaccination and inoculation against influenza 01/14/2013   Insomnia 09/23/2012   Low bone mass 09/23/2012   Mixed incontinence 09/23/2012   Vitamin D deficiency 09/23/2012   Polycythemia vera ([place]) 08/15/2011   Depression with anxiety    Polycythemia    Thrombocytosis     Scot Jun, PTA 01/16/2021, 2:31 PM  [place]. [place], [place], [address] Phone: [phone]   Fax:  [phone]  Name: Carolyn Figueroa MRN: [id number] Date of Birth: [birth year]

## 2021-01-19 ENCOUNTER — Other Ambulatory Visit: Payer: Self-pay

## 2021-01-19 ENCOUNTER — Encounter: Payer: Self-pay | Admitting: Physical Therapy

## 2021-01-19 ENCOUNTER — Ambulatory Visit: Payer: Medicare Other | Admitting: Physical Therapy

## 2021-01-19 DIAGNOSIS — R262 Difficulty in walking, not elsewhere classified: Secondary | ICD-10-CM

## 2021-01-19 DIAGNOSIS — R2681 Unsteadiness on feet: Secondary | ICD-10-CM

## 2021-01-19 DIAGNOSIS — M6281 Muscle weakness (generalized): Secondary | ICD-10-CM

## 2021-01-19 NOTE — Therapy (Signed)
[place]. [place], [place], [address] Phone: [phone]   Fax:  [phone]  Physical Therapy Treatment  Patient Details  Name: Carolyn Figueroa MRN: [id number] Date of Birth: 05-20-1945 Referring Provider (PT): Heath Lark   Encounter Date: 01/19/2021   PT End of Session - 01/19/21 1754     Visit Number 3    Number of Visits 17    Date for PT Re-Evaluation 03/08/21    PT Start Time 0109   arrived late   PT Stop Time 1745    PT Time Calculation (min) 34 min    Activity Tolerance Patient tolerated treatment well;Patient limited by fatigue    Behavior During Therapy [facility] for tasks assessed/performed             Past Medical History:  Diagnosis Date   Colon polyp 05/2003   Depression with anxiety    HTN (hypertension)    Insomnia    Need for prophylactic vaccination and inoculation against influenza 01/14/2013   Polycythemia    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   Subjective Assessment - 01/19/21 1751     Subjective I feel unsteady, I really want to work on balance today    Pertinent History long haul Covid along with Parkinson's, and polycythemia.    Limitations Standing;Walking;House hold activities;Lifting    How long can you stand comfortably? Fluctuates, but when dizzy, cannot remain standing    How long can you walk comfortably? Can walk in the house.    Patient Stated Goals Increase activity tolerance    Currently in Pain? No/denies                                    Balance Exercises - 01/19/21 0001       Balance Exercises: Standing   Standing Eyes Opened Head turns;Foam/compliant surface;Narrow base of  support (BOS)    Standing Eyes Closed 4 reps;30 secs    Tandem Gait Forward;2 reps    Retro Gait 4 reps    Other Standing Exercises standing on foam with external perturbations; standing marches on foam; work on posterior stepping strategy                PT Education - 01/19/21 1753     Education Details importance of regular exercise and improving endurance especially in face of long covid and parkinsons; good mechanics and sequencing with rollator/locking and unlocking brakes for transfers    Person(s) Educated Patient;Child(ren)    Methods Explanation;Demonstration    Comprehension Verbalized understanding              PT Short Term Goals - 01/11/21 1928       PT SHORT TERM GOAL #1   Title I with basic HEP    Baseline Initiated    Time 3    Period Weeks    Status New    Target Date 02/01/21               PT Long Term Goals - 01/11/21 1929       PT  LONG TERM GOAL #1   Title I with advanced HEP program    Baseline initiated    Time 8    Period Weeks    Status New    Target Date 03/08/21      PT LONG TERM GOAL #2   Title Improve 5 x sit to stand test to <30 seconds    Baseline 50 seconds    Time 8    Period Weeks    Status New    Target Date 03/08/21      PT LONG TERM GOAL #3   Title Perform TUG in < 25 seconds.    Baseline Unable to complete.    Time 8    Period Weeks    Status New    Target Date 03/08/21      PT LONG TERM GOAL #4   Title MI with all bed mobility and transfers    Baseline Up to min A    Time 8    Period Weeks    Status New    Target Date 03/08/21      PT LONG TERM GOAL #5   Title Patient will ambulate at least 500' with LRAD and MI    Baseline 80', no AD, unsteady and very slow, S    Time 8    Period Weeks    Status New    Target Date 03/08/21                   Plan - 01/19/21 1754     Clinical Impression Statement Ms. Haughey arrives today in good spirits, daughter present for session and very  encouraging of her ongoing participation in PT today. She endorses unsteadiness and issues with her balance- so spent this session working on functional balance skills. Needed up to [facility] for balance today when challenged. Easily fatigued. Also spent some time working on correct sequencing with rollator, locking/unlocking brakes, etc with good carryover today. Fatigued at [facility], education provided on importance of regular exercise/activity to tolerance especially in the context of Parkinsons and long Covid. Will benefit from ongoing skilled PT intervention, continue POC.    Personal Factors and Comorbidities Behavior Pattern;Past/Current Experience;Fitness;Age;Comorbidity 3+    Comorbidities Parkinsons, long haul Covid, polycythemia    Examination-Activity Limitations Bathing;Locomotion Level;Lift;Stand;Bed Mobility;Bend;Sleep;Squat;Dressing;Stairs    Examination-Participation Restrictions Meal Prep;Cleaning;Driving;Community Activity    Stability/Clinical Decision Making Evolving/Moderate complexity    Clinical Decision Making Moderate    Rehab Potential Good    PT Frequency 2x / week    PT Duration 8 weeks    PT Treatment/Interventions ADLs/Self Care Home Management;Moist Heat;Iontophoresis 4mg /ml Dexamethasone;Gait training;Stair training;Functional mobility training;Therapeutic activities;Therapeutic exercise;Patient/family education;Neuromuscular re-education;Balance training    PT Next Visit Plan FOTO, HEP, stretch and strengthen for trunk and BLE; continue working on balance and functional activity tolerance    PT Home Exercise Plan Access Code: [email or web address]  URL: [email or web address]  Date: 01/16/2021  Prepared by: Cheri Fowler    Exercises  Standing Row with Anchored Resistance - 1 x daily - 7 x weekly - 3 sets - 10 reps  Shoulder Extension with Resistance - 1 x daily - 7 x weekly - 3 sets - 10 reps    Consulted and Agree with Plan of Care Patient;Family member/caregiver    Family  Member Consulted daughter             Patient will benefit from skilled therapeutic intervention in order to improve the following deficits and impairments:  Pain, Decreased mobility, Postural  dysfunction, Decreased strength, Decreased range of motion, Decreased endurance, Decreased activity tolerance, Decreased balance, Difficulty walking, Impaired flexibility  Visit Diagnosis: Unsteadiness on feet  Difficulty in walking, not elsewhere classified  Muscle weakness (generalized)     Problem List Patient Active Problem List   Diagnosis Date Noted   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   History of Parkinson's disease 05/16/2020   Dizziness 02/29/2020   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 02/29/2020   History of COVID-19 02/29/2020   Anemia due to antineoplastic chemotherapy 12/18/2019   Preventive measure 12/23/2018   Needs flu shot 12/03/2017   Risk for falls 11/30/2015   Essential hypertension 07/19/2015   Parkinson disease ([place]) 07/19/2015   Weight loss 11/16/2013   Tremor 11/16/2013   Elevated BUN 08/20/2013   Leg pain 08/20/2013   Need for prophylactic vaccination and inoculation against influenza 01/14/2013   Insomnia 09/23/2012   Low bone mass 09/23/2012   Mixed incontinence 09/23/2012   Vitamin D deficiency 09/23/2012   Polycythemia vera ([place]) 08/15/2011   Depression with anxiety    Polycythemia    Thrombocytosis    Ann Lions PT, DPT, PN2   Supplemental Physical Therapist [place]    Pager [phone] Acute Rehab Office [place]. [place], [place], [address] Phone: [phone]   Fax:  [phone]  Name: Laure Leone MRN: [id number] Date of Birth: [birth year]

## 2021-01-23 ENCOUNTER — Ambulatory Visit: Payer: Medicare Other | Admitting: Physical Therapy

## 2021-01-25 ENCOUNTER — Ambulatory Visit: Payer: Medicare Other | Admitting: Physical Therapy

## 2021-01-30 ENCOUNTER — Ambulatory Visit: Payer: Medicare Other | Admitting: Physical Therapy

## 2021-01-30 ENCOUNTER — Other Ambulatory Visit: Payer: Self-pay | Admitting: Family Medicine

## 2021-01-30 DIAGNOSIS — Z1231 Encounter for screening mammogram for malignant neoplasm of breast: Secondary | ICD-10-CM

## 2021-02-01 ENCOUNTER — Ambulatory Visit: Payer: Medicare Other | Admitting: Physical Therapy

## 2021-02-06 ENCOUNTER — Other Ambulatory Visit: Payer: Self-pay | Admitting: Neurology

## 2021-02-06 ENCOUNTER — Ambulatory Visit: Payer: Medicare Other | Admitting: Physical Therapy

## 2021-02-06 DIAGNOSIS — R5383 Other fatigue: Secondary | ICD-10-CM

## 2021-02-06 DIAGNOSIS — G20 Parkinson's disease: Secondary | ICD-10-CM

## 2021-02-06 DIAGNOSIS — E538 Deficiency of other specified B group vitamins: Secondary | ICD-10-CM

## 2021-02-08 ENCOUNTER — Ambulatory Visit: Payer: Medicare Other | Admitting: Physical Therapy

## 2021-02-27 NOTE — Progress Notes (Signed)
Assessment/Plan:   1.  Parkinsons Disease  -Carbidopa/levodopa 25/100, 2 tablets at 7 AM/11 AM/4 PM.  She can use extra if needed.  -Continue carbidopa/levodopa 50/200 at bedtime  -discussed PT and will refer to [facility].  -Encouraged her getting back to exercise program.  Community resources provided, including the driving program.   2.  Severe cervical spinal stenosis  -Surgery was offered by neurosurgery but declined by patient.  She understands risks.  -paresthesias could be from this but sounds like potential PN.  Primary care treating patient with gabapentin, 100 mg twice a day.  3.  RBD  -Clinically doing well from that aspect.  4.  Post COVID anosmia and dizziness  -Dizziness started at the time that she got COVID and really never resolved.  We tried to change around her Parkinson's medications without relief.  Unfortunately, I do not think that this is a Parkinson's issue but rather a post-COVID issue.  She sought a second opinion [facility].  -I do not think that the levodopa has we have previously tried to change to that to other formulations without success.  -Patient apparently started on experimental naltrexone for post-COVID dizziness.  She thinks it is helping.  5.  Constipation  -discussed nature and pathophysiology and association with PD  -discussed importance of hydration.  Pt is to increase water intake  -pt is given a copy of the rancho recipe  -recommended daily colace  -recommended miralax prn  Subjective:   Carolyn Figueroa was seen today in follow up for Parkinsons disease.  My previous records were reviewed prior to todays visit as well as outside records available to me.  Patient with her daughter who supplements history.  Patient last saw primary care November 10, which was for urinary tract infection.  She was already being treated, but felt symptoms were worsening.  November 4 culture was positive, but November 10 culture was negative.   Her primary care wanted to treat her with Myrbetriq for overactive bladder, but insurance denied that.  She just started [place].  Her TSH was just slightly abnormal in our old office last visit, but we had not ordered a complete thyroid panel.  We had her follow-up with her primary care, which she did.  At that point in time, her TSH was much higher than in our office, 9.05.  However, her free T4 was normal.  She was started on synthroid a month ago.  Started on naltrexone about a month ago for post-COVID dizziness.  She thinks that is helping.  She did states she still gets some dizziness in the morning, but it is better in the afternoon.  Current movement disorder medications: Carbidopa/levodopa 25/100, 2 tablets at 7 AM/11 AM/4 PM Carbidopa/levodopa 50/200 at bedtime    ALLERGIES:  No Known Allergies  CURRENT MEDICATIONS:  Outpatient Encounter Medications as of 02/28/2021  Medication Sig   aspirin 81 MG tablet Take 81 mg by mouth daily.   carbidopa-levodopa (SINEMET CR) 50-200 MG tablet TAKE 1 TABLET BY MOUTH AT BEDTIME.   carbidopa-levodopa (SINEMET IR) 25-100 MG tablet TAKE TWO TABLETS BY MOUTH THREE TIMES A DAY   gabapentin (NEURONTIN) 100 MG capsule Take by mouth 2 (two) times daily.   hydroxyurea (HYDREA) 500 MG capsule TAKE 2 CAPSULES BY MOUTH ON MONDAY AND FRIDAYS AND 1 CAPSULE BY MOUTH the rest of the week   levothyroxine (SYNTHROID) 25 MCG tablet Take 25 mcg by mouth daily before breakfast.   meclizine (ANTIVERT) 12.5  MG tablet Take 12.5 mg by mouth 3 (three) times daily as needed for dizziness.   Multiple Vitamin (MULTIVITAMIN WITH MINERALS) TABS tablet Take 1 tablet by mouth daily.   sertraline (ZOLOFT) 50 MG tablet Take 0.5 tablets by mouth 2 (two) times daily.   tolterodine (DETROL LA) 2 MG 24 hr capsule Take 2 mg by mouth daily.   No facility-administered encounter medications on file as of 02/28/2021.    Objective:   PHYSICAL EXAMINATION:    VITALS:   Vitals:    02/28/21 1518  BP: 126/79  Pulse: 82  SpO2: 98%  Weight: 130 lb (59 kg)  Height: 5' (1.524 m)     GEN:  The patient appears stated age and is in NAD. HEENT:  Normocephalic, atraumatic.  The mucous membranes are moist. The superficial temporal arteries are without ropiness or tenderness. CV:  RRR Lungs:  CTAB Neck/HEME:  There are no carotid bruits bilaterally.  Neurological examination:  Orientation: The patient is alert and oriented x3. Cranial nerves: There is good facial symmetry with mild facial hypomimia. The speech is fluent and clear. She is hypophonic.  Soft palate rises symmetrically and there is no tongue deviation. Hearing is intact to conversational tone. Sensation: Sensation is intact to light touch throughout Motor: Strength is at least antigravity x4.  Movement examination: Tone: There is nl tone in the ue/le Abnormal movements: none Coordination:  There is no decremation with RAM's Gait and Station:The patient's stride length is slightly decreased with decreased arm swing on the L.   I have reviewed and interpreted the following labs independently    Chemistry      Component Value Date/Time   NA 139 11/16/2013 1404   K 4.0 11/16/2013 1404   CL 102 07/30/2012 1419   CO2 28 11/16/2013 1404   BUN 30.0 (H) 11/16/2013 1404   CREATININE 0.9 11/16/2013 1404      Component Value Date/Time   CALCIUM 9.1 11/16/2013 1404   ALKPHOS 49 11/16/2013 1404   AST 15 11/16/2013 1404   ALT <6 11/16/2013 1404   BILITOT 1.45 (H) 11/16/2013 1404       Lab Results  Component Value Date   WBC 4.0 12/27/2020   HGB 11.8 (L) 12/27/2020   HCT 36.4 12/27/2020   MCV 103.7 (H) 12/27/2020   PLT 332 12/27/2020    Lab Results  Component Value Date   TSH 4.59 (H) 08/04/2020   Lab Results  Component Value Date   VITAMINB12 620 08/04/2020     Cc:  [name], MD

## 2021-02-28 ENCOUNTER — Encounter: Payer: Self-pay | Admitting: Neurology

## 2021-02-28 ENCOUNTER — Ambulatory Visit (INDEPENDENT_AMBULATORY_CARE_PROVIDER_SITE_OTHER): Payer: Medicare Other | Admitting: Neurology

## 2021-02-28 ENCOUNTER — Other Ambulatory Visit: Payer: Self-pay

## 2021-02-28 VITALS — BP 126/79 | HR 82 | Ht 60.0 in | Wt 130.0 lb

## 2021-02-28 DIAGNOSIS — G20 Parkinson's disease: Secondary | ICD-10-CM | POA: Diagnosis not present

## 2021-02-28 MED ORDER — CARBIDOPA-LEVODOPA ER 50-200 MG PO TBCR: 1.0000 | EXTENDED_RELEASE_TABLET | Freq: Every day | ORAL | 1 refills | Status: AC

## 2021-02-28 NOTE — Patient Instructions (Signed)
Online Resources for Power over Parkinson's Group November 2022  Local [place] Online Groups  Power over [employer] :   Power Over Parkinson's Patient Education Group will be Wednesday, November 9th-*Hybrid meting*- in person at [place] location and via [facility] at 2:00 pm.   Upcoming Power over Parkinson's Meetings:  2nd Wednesdays of the month at 2 pm:  November 9th, December 14th [place] at [email or web address] if interested in participating in this online group Parkinson's Care Partners Group:    3rd Mondays, Contact Misty Paladino Atypical Parkinsonian Patient Group:   4th Wednesdays, Contact Misty Paladino If you are interested in participating in these online groups with Misty, please contact her directly for how to join those meetings.  Her contact information is [email or web address].   [place]:  www.[email or web address] PD Health at Home continues:  Mindfulness Mondays, Expert Briefing Tuesdays, Wellness Wednesdays, Take Time Thursdays, Fitness Fridays -Listings for June 2022 are on the website Upcoming Webinar:  Expert Briefing:  Let's Talk about Dementia.  Wednesday, November 2nd  at 1 pm. Register for Armed forces operational officer) at [email or web address].si  Please check out their website to sign up for emails and see their full online offerings  [place]:  [email or web address]  Upcoming Webinar:   2022 in Review:  Progress Toward Better Treatment and Prevention.  Thursday, November 17th at 12 noon Check out additional information on their website to see their full online offerings  [place]:  [email or web address] Upcoming Webinar:  NOH (Neurogenic Orthostatic Hypotension):  What it is, How to Know if you Have it, and How to Manage it.  Tuesday, November 15th at 3 pm.  Webinar Series:  Living with Parkinson's Meetup.    Third Thursdays of each month at 3 pm; next one is November 17th Care Partner Monthly Meetup.  With Robin Searing Phinney.  First Tuesday of each month, 2 pm Joy Breaks:  First Wednesday of each month, 2-3 pm. There will be art, doodling, making, crafting, listening, laughing, stories, and everything in between. No art experience necessary. No supplies required. Just show up for joy!  Register on their website. Check out additional information to Live Well Today on their website  Parkinson and Movement Disorders (PMD) Alliance:  www.pmdalliance.org [employer] Online:  Online Education Events Sign up for emails, which are sent weekly to give you updates on programming and online offerings  Parkinson's Association of the [place]:  www.parkinsonassociation.org Information on online support groups, education events, and online exercises including Yoga, Parkinson's exercises and more-LOTS of information on links to PD resources and online events Virtual Support Group through Parkinson's Association of the [place]; next one is scheduled for Wednesday, November 2nd  at 2 pm.  (These are typically scheduled for the 1st Wednesday of the month at 2 pm).  Visit website for details.  Additional links for movement activities: Parkinson's DRUMMING Classes/Music Therapy with Doylene Canning:  This is a returning class and it's FREE!  2nd Mondays, continuing November 14th.  Contact *Misty Taylor-Paladino at [email or web address] or Doylene Canning at [phone] or [email or web address]  PWR! Moves Classes at [place].  Wednesdays 10 and 11 am.  Contact Amy Marriott, PT [email or web address] if interested. Here is a link to the PWR!Moves classes on Zoom from [place] - Daily Mon-Sat at 10:00. Via Zoom, FREE and open to all.  There is also a link below via Facebook if you use that  platform. [email or web address].si [email or web address] Parkinson's Wellness Recovery (PWR! Moves)  www.pwr4life.org Info on  the PWR! Virtual Experience:  You will have access to our expertise through self-assessment, guided plans that start with the PD-specific fundamentals, educational content, tips, Q&A with an expert, and a growing Art therapist of PD-specific pre-recorded and live exercise classes of varying types and intensity - both physical and cognitive! If that is not enough, we offer 1:1 wellness consultations (in-person or virtual) to personalize your PWR! Research scientist (medical).  [place] Fridays:  As part of the PD Health @ Home program, this free video series focuses each week on one aspect of fitness designed to support people living with Parkinson's.  These weekly videos highlight the [place] recent fitness guidelines for people with Parkinson's disease.  HollywoodSale.dk Dance for PD website is offering free, live-stream classes throughout the week, as well as links to [employer] of classes:  [email or web address]://danceforparkinsons.org/ Dance for Parkinson's Class:  [place].  Free offering for people with Parkinson's and care partners; virtual class.  For more information, contact [phone] or email Ruffin Frederick at [email or web address] Virtual dance and Pilates for Parkinson's classes: Click on the Community Tab> Parkinson's Movement Initiative Tab.  To register for classes and for more information, visit www.[email or web address].co.za and click the "community" tab.  [employer] Parkinson's Cycling Classes  [employer]: 1pm on Fridays-Live classes at [employer] ([employer] at  [email or web address] or [phone]) [employer]: Virtual Classes Mondays and Thursdays Jeanette Caprice classes Tuesday, Wednesday and Thursday (contact [place] at [email or web address]  or [phone]) [place] Varied levels of classes are offered Mondays, Tuesdays and Thursdays at [employer].  To observe a class or for more information, call [phone] or email [email or web address] Well-Spring [employer]: Online Caregiver Education Opportunities:  www.well-springsolutions.org/caregiver-education/caregiver-support-group.  You may also contact Vickki Muff at [email or web address] or [phone].   *Multiple opportunities below, as November is Caregiver Month!* A Guide to Physical and Mental Fitness for Family Caregivers.  Wednesday, November 9th, 12:30-2 pm at [facility] of You!  Tuesday, November 15th, 11:30-12:45.  [facility], [place] Eduard Roux Understanding Care Options and Advanced Care Planning.  Monday, November 21st, 4:30-5:45.  [place], [place] above to register. Well-Spring Navigator:  [employer] program, a free service to help individuals and families through the journey of determining care for older adults.  The "Navigator" is a Education officer, museum, Arnell Asal, who will speak with a prospective client and/or loved ones to provide an assessment of the situation and a set of recommendations for a personalized care plan -- all free of charge, and whether Well-Spring Solutions offers the needed service or not. If the need is not a service we provide, we are well-connected with reputable programs in town that we can refer you to.  www.well-springsolutions.org or to speak with the Navigator, call [phone].

## 2021-03-03 ENCOUNTER — Other Ambulatory Visit: Payer: Self-pay

## 2021-03-03 ENCOUNTER — Ambulatory Visit
Admission: RE | Admit: 2021-03-03 | Discharge: 2021-03-03 | Disposition: A | Discharge status: Home / Self Care | Payer: Medicare Other | Source: Ambulatory Visit | Attending: Family Medicine | Admitting: Family Medicine

## 2021-03-03 DIAGNOSIS — Z1231 Encounter for screening mammogram for malignant neoplasm of breast: Secondary | ICD-10-CM

## 2021-03-28 ENCOUNTER — Ambulatory Visit: Payer: Medicare PPO | Attending: Hematology and Oncology | Admitting: Physical Therapy

## 2021-03-28 ENCOUNTER — Encounter: Payer: Self-pay | Admitting: Physical Therapy

## 2021-03-28 ENCOUNTER — Other Ambulatory Visit: Payer: Self-pay

## 2021-03-28 DIAGNOSIS — R262 Difficulty in walking, not elsewhere classified: Secondary | ICD-10-CM | POA: Diagnosis present

## 2021-03-28 DIAGNOSIS — G20 Parkinson's disease: Secondary | ICD-10-CM | POA: Diagnosis present

## 2021-03-28 DIAGNOSIS — R2689 Other abnormalities of gait and mobility: Secondary | ICD-10-CM | POA: Diagnosis present

## 2021-03-28 DIAGNOSIS — T451X5A Adverse effect of antineoplastic and immunosuppressive drugs, initial encounter: Secondary | ICD-10-CM | POA: Insufficient documentation

## 2021-03-28 DIAGNOSIS — M6281 Muscle weakness (generalized): Secondary | ICD-10-CM

## 2021-03-28 DIAGNOSIS — R2681 Unsteadiness on feet: Secondary | ICD-10-CM

## 2021-03-28 DIAGNOSIS — G20A1 Parkinson's disease without dyskinesia, without mention of fluctuations: Secondary | ICD-10-CM

## 2021-03-28 DIAGNOSIS — D6481 Anemia due to antineoplastic chemotherapy: Secondary | ICD-10-CM | POA: Insufficient documentation

## 2021-03-28 DIAGNOSIS — R42 Dizziness and giddiness: Secondary | ICD-10-CM | POA: Diagnosis present

## 2021-03-28 NOTE — Therapy (Signed)
[address] [place], [place], [address] Phone: [phone]   Fax:  [phone]  Physical Therapy Evaluation  Patient Details  Name: Carolyn Figueroa MRN: [id number] Date of Birth: [birth year] Referring Provider (PT): Tat   Encounter Date: 03/28/2021   PT End of Session - 03/28/21 1838     Visit Number 1    Date for PT Re-Evaluation 06/26/21    Authorization Type [employer] but reports that will change in a month to regular medicare    PT Start Time 1748    PT Stop Time 1839    PT Time Calculation (min) 51 min    Activity Tolerance Patient tolerated treatment well    Behavior During Therapy [facility] for tasks assessed/performed             Past Medical History:  Diagnosis Date   Colon polyp 05/2003   Depression with anxiety    HTN (hypertension)    Insomnia    Need for prophylactic vaccination and inoculation against influenza 01/14/2013   Polycythemia    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    Subjective Assessment - 03/28/21 1754     Subjective Patient was referred to [place] in October, she came 3 visits , she reports that she did not want to come.  She has long haul covid but reports that she started a new drug and it seems to help her energy level.  She has a diagnosis of parkinson's and reports a fall about 3 weeks.  She reports the fall was on steps she fell backwards    Patient is accompained by: Family member    Pertinent History long haul Covid along with Parkinson's, and polycythemia.   Reports dizziness mostly everyday    Limitations Standing;Walking;House hold activities;Lifting    How long can you stand comfortably? Fluctuates, but when dizzy, cannot  remain standing    Patient Stated Goals be stronger, walk better, better balance, prior to her getting covid [facility]    Currently in Pain? Yes    Pain Score 5     Pain Location Back    Pain Descriptors / Indicators Aching;Sore    Aggravating Factors  walking, standing,    Pain Relieving Factors some exercises                [facility] PT Assessment - 03/28/21 0001       Assessment   Medical Diagnosis Parkinson's, weakness, difficulty walking    Referring Provider (PT) Tat    Onset Date/Surgical Date 02/25/21    Prior Therapy yes      Precautions   Precautions None      Balance Screen   Has the patient fallen in the past 6 months Yes    How many times? 1    Has the patient had a decrease in activity level because of a fear of falling?  Yes    Is the patient reluctant to leave their home because of a fear of falling?  Yes      Home Environment   Living Arrangements Children;Other relatives    Alternate Level Stairs-Number of Steps  Hunt seat      Prior Function   Level of Independence Independent with gait;Needs assistance with ADLs    Leisure was [facility] the Y 4[facility]      Sit to Stand   Comments 5x sit to stand 20 seconds      Posture/Postural Control   Posture Comments flexed posture, forward head, elevated shoulders      Strength   Right Hip Flexion 4-/5    Right Hip Extension 3+/5    Right Hip ABduction 3+/5    Left Hip Flexion 3+/5    Left Hip Extension 3+/5    Left Hip ABduction 3+/5    Right Knee Flexion 4-/5    Right Knee Extension 4-/5    Left Knee Flexion 4-/5    Left Knee Extension 4-/5    Right Ankle Dorsiflexion 4-/5    Right Ankle Plantar Flexion 4-/5    Right Ankle Inversion 4-/5    Right Ankle Eversion 4-/5    Left Ankle Dorsiflexion 4-/5    Left Ankle Plantar Flexion 4-/5    Left Ankle Inversion 4-/5    Left Ankle Eversion 4-/5      Standardized Balance Assessment   Standardized Balance  Assessment Timed Up and Go Test;Berg Balance Test      Berg Balance Test   Sit to Stand Able to stand without using hands and stabilize independently    Standing Unsupported Able to stand safely 2 minutes    Sitting with Back Unsupported but Feet Supported on Floor or Stool Able to sit safely and securely 2 minutes    Stand to Sit Sits safely with minimal use of hands    Transfers Able to transfer safely, definite need of hands    Standing Unsupported with Eyes Closed Able to stand 10 seconds with supervision    Standing Unsupported with Feet Together Able to place feet together independently and stand for 1 minute with supervision    From Standing, Reach Forward with Outstretched Arm Can reach forward >12 cm safely (5")    From Standing Position, Pick up Object from Floor Able to pick up shoe, needs supervision    From Standing Position, Turn to Look Behind Over each Shoulder Turn sideways only but maintains balance    Turn 360 Degrees Able to turn 360 degrees safely but slowly    Standing Unsupported, Alternately Place Feet on Step/Stool Able to complete 4 steps without aid or supervision    Standing Unsupported, One Foot in Front Able to take small step independently and hold 30 seconds    Standing on One Leg Able to lift leg independently and hold equal to or more than 3 seconds    Total Score 41      Timed Up and Go Test   Normal TUG (seconds) 18                    Vestibular Assessment - 03/28/21 0001       Oculomotor Exam   Smooth Pursuits Saccades    Saccades Dysmetria;Overshoots;Undershoots                Objective measurements completed on examination: See above findings.                  PT Short Term Goals - 03/28/21 1843       PT SHORT TERM GOAL #1   Title I with basic HEP    Time  3    Period Weeks    Status New               PT Long Term Goals - 03/28/21 1843       PT LONG TERM GOAL #1   Title I with advanced HEP  program    Time 12    Period Weeks    Status New      PT LONG TERM GOAL #2   Title Improve 5 x sit to stand test to <18seconds    Baseline 20    Time 12    Period Weeks    Status New      PT LONG TERM GOAL #3   Title Perform TUG in < 15 seconds.    Time 12    Period Weeks    Status New      PT LONG TERM GOAL #4   Title increase berg balance score to 48/56    Period Weeks    Status New      PT LONG TERM GOAL #5   Title Patient will ambulate at least 500' with LRAD and MI    Time 72    Status New                    Plan - 03/28/21 1839     Clinical Impression Statement Patient reports a fall about 3-4 weeks ago, she has dx of Parkinson;s and long haul covid.  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  She was seen by [place] in October but reports that she did not feel well at that time and stopped coming, her daughter reports that she was put on a medication and it seems to have her feeling better    Personal Factors and Comorbidities Behavior Pattern;Past/Current Experience;Fitness;Age;Comorbidity 3+    Comorbidities Parkinsons, long haul Covid, polycythemia    Examination-Activity Limitations Bathing;Locomotion Level;Lift;Stand;Bed Mobility;Bend;Sleep;Squat;Dressing;Stairs    Examination-Participation Restrictions Meal Prep;Cleaning;Driving;Community Activity    Stability/Clinical Decision Making Evolving/Moderate complexity    Clinical Decision Making Moderate    Rehab Potential Good    PT Frequency 2x / week    PT Duration 12 weeks    PT Treatment/Interventions ADLs/Self Care Home Management;Moist Heat;Iontophoresis 4mg /ml Dexamethasone;Gait  training;Stair training;Functional mobility training;Therapeutic activities;Therapeutic exercise;Patient/family education;Neuromuscular re-education;Balance training;Vestibular;Visual/perceptual remediation/compensation;Manual techniques    PT Next Visit Plan work on strength, balance and VOR    Consulted and Agree with Plan of Care Patient;Family member/caregiver    Family Member Consulted daughter             Patient will benefit from skilled therapeutic intervention in order to improve the following deficits and impairments:  Pain, Decreased mobility, Postural dysfunction, Decreased strength, Decreased range of motion, Decreased endurance, Decreased activity tolerance, Decreased balance, Difficulty walking, Impaired flexibility  Visit Diagnosis: Unsteadiness on feet - Plan: PT plan of care cert/re-cert  Difficulty in walking, not elsewhere classified - Plan: PT plan of care cert/re-cert  Muscle weakness (generalized) - Plan: PT plan of care cert/re-cert  Dizziness and giddiness - Plan: PT plan of care cert/re-cert  Parkinson disease ([place]) - Plan: PT plan of care  cert/re-cert     Problem List Patient Active Problem List   Diagnosis Date Noted   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   History of Parkinson's disease 05/16/2020   Dizziness 02/29/2020   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 02/29/2020   History of COVID-19 02/29/2020   Anemia due to antineoplastic chemotherapy 12/18/2019   Preventive measure 12/23/2018   Needs flu shot 12/03/2017   Risk for falls 11/30/2015   Essential hypertension 07/19/2015   Parkinson disease ([place]) 07/19/2015   Weight loss 11/16/2013   Tremor 11/16/2013   Elevated BUN 08/20/2013   Leg pain 08/20/2013   Need for prophylactic vaccination and inoculation against influenza 01/14/2013   Insomnia 09/23/2012   Low bone mass 09/23/2012   Mixed incontinence 09/23/2012   Vitamin D deficiency 09/23/2012   Polycythemia vera ([place])  08/15/2011   Depression with anxiety    Polycythemia    Thrombocytosis     Sumner Boast, PT 03/28/2021, 6:46 PM  [address]. [place], [place], [address] Phone: [phone]   Fax:  [phone]  Name: Carolyn Figueroa MRN: [id number] Date of Birth: [birth year]

## 2021-04-03 ENCOUNTER — Encounter: Payer: Self-pay | Admitting: Physical Therapy

## 2021-04-03 ENCOUNTER — Ambulatory Visit: Payer: Medicare PPO | Admitting: Physical Therapy

## 2021-04-03 ENCOUNTER — Other Ambulatory Visit: Payer: Self-pay

## 2021-04-03 DIAGNOSIS — D6481 Anemia due to antineoplastic chemotherapy: Secondary | ICD-10-CM

## 2021-04-03 DIAGNOSIS — R42 Dizziness and giddiness: Secondary | ICD-10-CM

## 2021-04-03 DIAGNOSIS — R2681 Unsteadiness on feet: Secondary | ICD-10-CM

## 2021-04-03 DIAGNOSIS — M6281 Muscle weakness (generalized): Secondary | ICD-10-CM

## 2021-04-03 DIAGNOSIS — R262 Difficulty in walking, not elsewhere classified: Secondary | ICD-10-CM

## 2021-04-03 DIAGNOSIS — G20 Parkinson's disease: Secondary | ICD-10-CM

## 2021-04-03 NOTE — Patient Instructions (Signed)
Access Code: [email or web address] URL: [email or web address] Date: 04/03/2021 Prepared by: Ethel Rana  Exercises Seated Gaze Stabilization with Head Nod - 1 x daily - 7 x weekly - 2 sets - 20 reps Seated Gaze Stabilization with Head Rotation - 1 x daily - 7 x weekly - 2 sets - 20 reps Seated Horizontal Saccades - 1 x daily - 7 x weekly - 2 sets - 20 reps Seated Vertical Saccades - 1 x daily - 7 x weekly - 2 sets - 20 reps

## 2021-04-03 NOTE — Therapy (Signed)
[address]. [place], [place], [address] Phone: [phone]   Fax:  [phone]  Physical Therapy Treatment  Patient Details  Name: Carolyn Figueroa MRN: [id number] Date of Birth: 1946/01/24 Referring Provider (PT): Tat   Encounter Date: 04/03/2021   PT End of Session - 04/03/21 1706     Visit Number 2    Number of Visits 17    Date for PT Re-Evaluation 06/26/21    PT Start Time 1628    PT Stop Time 1706    PT Time Calculation (min) 38 min    Activity Tolerance Patient tolerated treatment well    Behavior During Therapy [facility] for tasks assessed/performed             Past Medical History:  Diagnosis Date   Colon polyp 05/2003   Depression with anxiety    HTN (hypertension)    Insomnia    Need for prophylactic vaccination and inoculation against influenza 01/14/2013   Polycythemia    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   Subjective Assessment - 04/03/21 1625     Subjective Patient reports no pain, she is feeling good. No more falls. She reports some woozines, took some medicine and it is better now.    Patient is accompained by: Family member    Pertinent History long haul Covid along with Parkinson's, and polycythemia.   Reports dizziness mostly everyday    Limitations Standing;Walking;House hold activities;Lifting    How long can you stand comfortably? Fluctuates, but when dizzy, cannot remain standing    How long can you walk comfortably? Can walk in the house.    Patient Stated Goals be stronger, walk better, better balance, prior to her getting covid she [facility]    Currently in Pain? No/denies                                [facility] Adult PT Treatment/Exercise - 04/03/21 0001       Lumbar Exercises: Aerobic   Nustep L4 x 4 minutes      Knee/Hip Exercises: Standing   Hip Flexion Stengthening;Both;2 sets;10 reps;Knee straight    Hip Flexion Limitations Yellow Theraband rsistance    Hip Abduction Stengthening;Both;2 sets;10 reps;Knee straight    Abduction Limitations Yellow Theraband    Hip Extension Stengthening;Both;2 sets;10 reps;Knee straight    Extension Limitations Yellow Theraband    Lateral Step Up Both;1 set;10 reps;Hand Hold: 2;Step Height: 6"    Forward Step Up Both;1 set;10 reps;Hand Hold: 2;Step Height: 6"      Knee/Hip Exercises: Seated   Sit to Sand 1 set;10 reps;without UE support                     PT Education - 04/03/21 1705     Education Details HEP for vision stabilization    Person(s) Educated Patient;Child(ren)    Methods Explanation;Demonstration;Handout    Comprehension Returned demonstration;Verbalized understanding              PT Short Term Goals - 03/28/21 1843  PT SHORT TERM GOAL #1   Title I with basic HEP    Time 3    Period Weeks    Status New               PT Long Term Goals - 03/28/21 1843       PT LONG TERM GOAL #1   Title I with advanced HEP program    Time 12    Period Weeks    Status New      PT LONG TERM GOAL #2   Title Improve 5 x sit to stand test to <18seconds    Baseline 20    Time 12    Period Weeks    Status New      PT LONG TERM GOAL #3   Title Perform TUG in < 15 seconds.    Time 12    Period Weeks    Status New      PT LONG TERM GOAL #4   Title increase berg balance score to 48/56    Period Weeks    Status New      PT LONG TERM GOAL #5   Title Patient will ambulate at least 500' with LRAD and MI    Time 12    Status New                   Plan - 04/03/21 1706     Clinical Impression Statement No more falls. She reports that she has been  performing saccades-horizontal and they seem to be helping. Initiated treatment with physical strength and endurance activities. Then transitioned to visual HEP, expanding on original exercise. Patient participated well and tolerated all activities with out excessive fatigue or dizziness.    Personal Factors and Comorbidities Behavior Pattern;Past/Current Experience;Fitness;Age;Comorbidity 3+    Comorbidities Parkinsons, long haul Covid, polycythemia    Examination-Activity Limitations Bathing;Locomotion Level;Lift;Stand;Bed Mobility;Bend;Sleep;Squat;Dressing;Stairs    Examination-Participation Restrictions Meal Prep;Cleaning;Driving;Community Activity    Stability/Clinical Decision Making Evolving/Moderate complexity    Clinical Decision Making Moderate    Rehab Potential Good    PT Frequency 2x / week    PT Duration Other (comment)   11w   PT Treatment/Interventions ADLs/Self Care Home Management;Moist Heat;Iontophoresis 4mg /ml Dexamethasone;Gait training;Stair training;Functional mobility training;Therapeutic activities;Therapeutic exercise;Patient/family education;Neuromuscular re-education;Balance training;Vestibular;Visual/perceptual remediation/compensation;Manual techniques    PT Next Visit Plan work on strength, balance and VOR    PT Home Exercise Plan [place]    Consulted and Agree with Plan of Care Patient;Family member/caregiver    Family Member Consulted daughter             Patient will benefit from skilled therapeutic intervention in order to improve the following deficits and impairments:  Pain, Decreased mobility, Postural dysfunction, Decreased strength, Decreased range of motion, Decreased endurance, Decreased activity tolerance, Decreased balance, Difficulty walking, Impaired flexibility  Visit Diagnosis: Unsteadiness on feet  Difficulty in walking, not elsewhere classified  Muscle weakness (generalized)  Dizziness and giddiness  Parkinson disease ([place])  Anemia  due to antineoplastic chemotherapy     Problem List Patient Active Problem List   Diagnosis Date Noted   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   History of Parkinson's disease 05/16/2020   Dizziness 02/29/2020   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 02/29/2020   History of COVID-19 02/29/2020   Anemia due to antineoplastic chemotherapy 12/18/2019   Preventive measure 12/23/2018   Needs flu shot 12/03/2017   Risk for falls 11/30/2015   Essential hypertension 07/19/2015   Parkinson disease (  [place]) 07/19/2015   Weight loss 11/16/2013   Tremor 11/16/2013   Elevated BUN 08/20/2013   Leg pain 08/20/2013   Need for prophylactic vaccination and inoculation against influenza 01/14/2013   Insomnia 09/23/2012   Low bone mass 09/23/2012   Mixed incontinence 09/23/2012   Vitamin D deficiency 09/23/2012   Polycythemia vera ([place]) 08/15/2011   Depression with anxiety    Polycythemia    Thrombocytosis     Marcelina Morel, DPT 04/03/2021, 5:09 PM  [address]. [place], [place], [address] Phone: [phone]   Fax:  [phone]  Name: Hendy Brindle MRN: [id number] Date of Birth: 1945-09-13

## 2021-04-10 ENCOUNTER — Ambulatory Visit: Payer: Medicare PPO | Admitting: Physical Therapy

## 2021-04-10 ENCOUNTER — Encounter: Payer: Self-pay | Admitting: Physical Therapy

## 2021-04-10 ENCOUNTER — Other Ambulatory Visit: Payer: Self-pay

## 2021-04-10 DIAGNOSIS — G20A1 Parkinson's disease without dyskinesia, without mention of fluctuations: Secondary | ICD-10-CM

## 2021-04-10 DIAGNOSIS — R2681 Unsteadiness on feet: Secondary | ICD-10-CM

## 2021-04-10 DIAGNOSIS — M6281 Muscle weakness (generalized): Secondary | ICD-10-CM

## 2021-04-10 DIAGNOSIS — R42 Dizziness and giddiness: Secondary | ICD-10-CM

## 2021-04-10 DIAGNOSIS — G20 Parkinson's disease: Secondary | ICD-10-CM

## 2021-04-10 DIAGNOSIS — R262 Difficulty in walking, not elsewhere classified: Secondary | ICD-10-CM

## 2021-04-10 NOTE — Patient Instructions (Signed)
Access Code: [email or web address] URL: [email or web address] Date: 04/10/2021 Prepared by: Ethel Rana  Exercises Seated Gaze Stabilization with Head Nod - 1 x daily - 7 x weekly - 2 sets - 20 reps Seated Gaze Stabilization with Head Rotation - 1 x daily - 7 x weekly - 2 sets - 20 reps Seated Horizontal Saccades - 1 x daily - 7 x weekly - 2 sets - 20 reps Seated Vertical Saccades - 1 x daily - 7 x weekly - 2 sets - 20 reps Supine Bridge - 1 x daily - 7 x weekly - 2 sets - 10 reps Hooklying Clamshell with Resistance - 1 x daily - 7 x weekly - 2 sets - 10 reps Supine March - 1 x daily - 7 x weekly - 2 sets - 10 reps Supine Lower Trunk Rotation - 1 x daily - 7 x weekly - 3 sets - 10 sec hold Standing 3-Way Leg Reach with Resistance at Ankles and Counter Support - 1 x daily - 7 x weekly - 1 sets - 10 reps Standing Heel Raise with Support - 1 x daily - 7 x weekly - 2 sets - 10 reps

## 2021-04-10 NOTE — Therapy (Signed)
[place] [place], [place], [address] Phone: [phone]   Fax:  [phone]  Physical Therapy Treatment  Patient Details  Name: Carolyn Figueroa MRN: [id number] Date of Birth: Aug 20, 1945 Referring Provider (PT): Tat   Encounter Date: 04/10/2021   PT End of Session - 04/10/21 1748     Visit Number 3    Number of Visits 17    Date for PT Re-Evaluation 06/26/21    PT Start Time 8850    PT Stop Time 2774    PT Time Calculation (min) 40 min    Activity Tolerance Patient tolerated treatment well    Behavior During Therapy [facility] for tasks assessed/performed             Past Medical History:  Diagnosis Date   Colon polyp 05/2003   Depression with anxiety    HTN (hypertension)    Insomnia    Need for prophylactic vaccination and inoculation against influenza 01/14/2013   Polycythemia    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   Subjective Assessment - 04/10/21 1713     Subjective Patient that her eyes have been itchy and red, but she uses drops and that helps. She is performing her HEP and reports no dizzy. She is tired at times, including today.    Patient is accompained by: Family member    Pertinent History long haul Covid along with Parkinson's, and polycythemia.   Reports dizziness mostly everyday    Limitations Standing;Walking;House hold activities;Lifting    How long can you stand comfortably? Fluctuates, but when dizzy, cannot remain standing    Patient Stated Goals be stronger, walk better, better balance, prior to her getting cov[facility]    Currently in Pain? No/denies                               [facility]  Adult PT Treatment/Exercise - 04/10/21 0001       Lumbar Exercises: Stretches   Lower Trunk Rotation 3 reps;10 seconds      Lumbar Exercises: Supine   Other Supine Lumbar Exercises Supine marching with abd stabilization 1 x 10 reps with each leg.      Knee/Hip Exercises: Supine   Bridges Strengthening;Both;1 set;10 reps    Other Supine Knee/Hip Exercises Clamshells iwth green theraband resistance, 10 reps                     PT Education - 04/10/21 1739     Education Details HEP updates for strength    Person(s) Educated Patient    Methods Explanation;Demonstration;Handout    Comprehension Returned demonstration;Verbalized understanding              PT Short Term Goals - 04/10/21 1752       PT SHORT TERM GOAL #1   Title I with basic HEP    Baseline updated today.    Time 2    Period Weeks    Status On-going    Target Date 04/24/21  PT Long Term Goals - 03/28/21 1843       PT LONG TERM GOAL #1   Title I with advanced HEP program    Time 12    Period Weeks    Status New      PT LONG TERM GOAL #2   Title Improve 5 x sit to stand test to <18seconds    Baseline 20    Time 12    Period Weeks    Status New      PT LONG TERM GOAL #3   Title Perform TUG in < 15 seconds.    Time 12    Period Weeks    Status New      PT LONG TERM GOAL #4   Title increase berg balance score to 48/56    Period Weeks    Status New      PT LONG TERM GOAL #5   Title Patient will ambulate at least 500' with LRAD and MI    Time 12    Status New                   Plan - 04/10/21 1749     Clinical Impression Statement Patient reports no more falls. She C/O L eye being itchy, but says her eye drops soothe it. She arrived feeling somewhat tired, but agreeable to participate. Performed multiple exercises fo rtrunk and LE strength, added to HEP. She tolerated all activities well and reporte dfeeling better after treatment.    Personal Factors  and Comorbidities Behavior Pattern;Past/Current Experience;Fitness;Age;Comorbidity 3+    Comorbidities Parkinsons, long haul Covid, polycythemia    Examination-Activity Limitations Bathing;Locomotion Level;Lift;Stand;Bed Mobility;Bend;Sleep;Squat;Dressing;Stairs    Examination-Participation Restrictions Meal Prep;Cleaning;Driving;Community Activity    Stability/Clinical Decision Making Evolving/Moderate complexity    Clinical Decision Making Moderate    Rehab Potential Good    PT Frequency 2x / week    PT Duration Other (comment)   11w   PT Treatment/Interventions ADLs/Self Care Home Management;Moist Heat;Iontophoresis 4mg /ml Dexamethasone;Gait training;Stair training;Functional mobility training;Therapeutic activities;Therapeutic exercise;Patient/family education;Neuromuscular re-education;Balance training;Vestibular;Visual/perceptual remediation/compensation;Manual techniques    PT Next Visit Plan continue with strength, balance, and VOR rehab. Patient also asking about walking more.    PT Home Exercise Plan [email or web address]    Consulted and Agree with Plan of Care Patient;Family member/caregiver    Family Member Consulted daughter             Patient will benefit from skilled therapeutic intervention in order to improve the following deficits and impairments:  Pain, Decreased mobility, Postural dysfunction, Decreased strength, Decreased range of motion, Decreased endurance, Decreased activity tolerance, Decreased balance, Difficulty walking, Impaired flexibility  Visit Diagnosis: Unsteadiness on feet  Difficulty in walking, not elsewhere classified  Muscle weakness (generalized)  Dizziness and giddiness  Parkinson disease ([place])     Problem List Patient Active Problem List   Diagnosis Date Noted   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   History of Parkinson's disease 05/16/2020   Dizziness 02/29/2020   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 02/29/2020   History of  COVID-19 02/29/2020   Anemia due to antineoplastic chemotherapy 12/18/2019   Preventive measure 12/23/2018   Needs flu shot 12/03/2017   Risk for falls 11/30/2015   Essential hypertension 07/19/2015   Parkinson disease ([place]) 07/19/2015   Weight loss 11/16/2013   Tremor 11/16/2013   Elevated BUN 08/20/2013   Leg pain 08/20/2013   Need for prophylactic vaccination and inoculation against influenza 01/14/2013   Insomnia 09/23/2012  Low bone mass 09/23/2012   Mixed incontinence 09/23/2012   Vitamin D deficiency 09/23/2012   Polycythemia vera ([place]) 08/15/2011   Depression with anxiety    Polycythemia    Thrombocytosis     Marcelina Morel, DPT 04/10/2021, 5:54 PM  [place]. [place], [place], [address] Phone: [phone]   Fax:  [phone]  Name: Carolyn Figueroa MRN: [id number] Date of Birth: 24-May-1945

## 2021-04-12 ENCOUNTER — Ambulatory Visit: Payer: Medicare PPO | Admitting: Physical Therapy

## 2021-04-12 ENCOUNTER — Other Ambulatory Visit: Payer: Self-pay

## 2021-04-12 ENCOUNTER — Encounter: Payer: Self-pay | Admitting: Physical Therapy

## 2021-04-12 VITALS — BP 161/113 | HR 102

## 2021-04-12 DIAGNOSIS — R2689 Other abnormalities of gait and mobility: Secondary | ICD-10-CM

## 2021-04-12 DIAGNOSIS — R2681 Unsteadiness on feet: Secondary | ICD-10-CM | POA: Diagnosis not present

## 2021-04-12 DIAGNOSIS — G20A1 Parkinson's disease without dyskinesia, without mention of fluctuations: Secondary | ICD-10-CM

## 2021-04-12 DIAGNOSIS — M6281 Muscle weakness (generalized): Secondary | ICD-10-CM

## 2021-04-12 DIAGNOSIS — R262 Difficulty in walking, not elsewhere classified: Secondary | ICD-10-CM

## 2021-04-12 DIAGNOSIS — R42 Dizziness and giddiness: Secondary | ICD-10-CM

## 2021-04-12 DIAGNOSIS — D6481 Anemia due to antineoplastic chemotherapy: Secondary | ICD-10-CM

## 2021-04-12 DIAGNOSIS — G20 Parkinson's disease: Secondary | ICD-10-CM

## 2021-04-12 NOTE — Therapy (Signed)
[place]. [place], [place], [address] Phone: [phone]   Fax:  [phone]  Physical Therapy Treatment  Patient Details  Name: Carolyn Figueroa MRN: [id number] Date of Birth: Aug 16, 1945 Referring Provider (PT): Tat   Encounter Date: 04/12/2021   PT End of Session - 04/12/21 1750     Visit Number 4    Number of Visits 17    Date for PT Re-Evaluation 06/26/21    PT Start Time 2637    PT Stop Time 1741    PT Time Calculation (min) 23 min    Activity Tolerance Patient tolerated treatment well;Treatment limited secondary to medical complications (Comment)    Behavior During Therapy [facility] for tasks assessed/performed             Past Medical History:  Diagnosis Date   Colon polyp 05/2003   Depression with anxiety    HTN (hypertension)    Insomnia    Need for prophylactic vaccination and inoculation against influenza 01/14/2013   Polycythemia    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 She is not able to perform all exercises daily, encouraged to perform some of them each day. She feels tired today, and somewhat off balance.    Pertinent History long haul Covid along with Parkinson's, and polycythemia.   Reports dizziness mostly everyday    Limitations Standing;Walking;House hold activities;Lifting    Currently in Pain? No/denies                               [facility] Adult PT Treatment/Exercise - 04/12/21 0001       Lumbar Exercises: Aerobic   Stationary  Bike L3.5 x 3 minutes. Reported dizziness, so stopped.                     PT Education - 04/12/21 1747     Education Details Patient and daughter were educated to patient's high BP today, and decision to hold therapy. Encouraged them to monitor BP at home. It came down with each assessment here today, but final reading was 161/113 with HR 102. If BP and HR remain elevated, call Dr.    Terence Lux) Educated Patient;Child(ren)    Methods Explanation    Comprehension Verbalized understanding              PT Short Term Goals - 04/10/21 1752       PT SHORT TERM GOAL #1   Title I with basic HEP    Baseline updated today.    Time 2    Period Weeks    Status On-going    Target Date 04/24/21               PT Long Term Goals - 03/28/21 1843       PT LONG TERM GOAL #1   Title I with advanced HEP program  Time 12    Period Weeks    Status New      PT LONG TERM GOAL #2   Title Improve 5 x sit to stand test to <18seconds    Baseline 20    Time 12    Period Weeks    Status New      PT LONG TERM GOAL #3   Title Perform TUG in < 15 seconds.    Time 12    Period Weeks    Status New      PT LONG TERM GOAL #4   Title increase berg balance score to 48/56    Period Weeks    Status New      PT LONG TERM GOAL #5   Title Patient will ambulate at least 500' with LRAD and MI    Time 80    Status New                   Plan - 04/12/21 1751     Clinical Impression Statement Patient arrived not feeling well, tired, but wanted to proceed. She rode bike x 3 minutes, but then asked to stop and have BP measured. It was severely elevated at 185/125, HR 109. Repeated BP 2 more times in teh next 10 minutes with depp breathing/relaxation. The numbers trended down at 177/12, 104 and then161/113, 102. Howver,, the decision was made to defer further treatment. patient and daughter educated to monitor vitals and call Dr if they do not continue to go down.    Personal  Factors and Comorbidities Behavior Pattern;Past/Current Experience;Fitness;Age;Comorbidity 3+    Comorbidities Parkinsons, long haul Covid, polycythemia    Examination-Activity Limitations Bathing;Locomotion Level;Lift;Stand;Bed Mobility;Bend;Sleep;Squat;Dressing;Stairs    Examination-Participation Restrictions Meal Prep;Cleaning;Driving;Community Activity    Stability/Clinical Decision Making Evolving/Moderate complexity    Clinical Decision Making Moderate    Rehab Potential Good    PT Frequency 2x / week    PT Duration Other (comment)   11w   PT Treatment/Interventions ADLs/Self Care Home Management;Moist Heat;Iontophoresis 4mg /ml Dexamethasone;Gait training;Stair training;Functional mobility training;Therapeutic activities;Therapeutic exercise;Patient/family education;Neuromuscular re-education;Balance training;Vestibular;Visual/perceptual remediation/compensation;Manual techniques    PT Next Visit Plan RE-assess vitals/moitor.    PT Home Exercise Plan [email or web address]    Consulted and Agree with Plan of Care Patient;Family member/caregiver    Family Member Consulted daughter             Patient will benefit from skilled therapeutic intervention in order to improve the following deficits and impairments:  Pain, Decreased mobility, Postural dysfunction, Decreased strength, Decreased range of motion, Decreased endurance, Decreased activity tolerance, Decreased balance, Difficulty walking, Impaired flexibility  Visit Diagnosis: Unsteadiness on feet  Difficulty in walking, not elsewhere classified  Muscle weakness (generalized)  Dizziness and giddiness  Parkinson disease ([place])  Anemia due to antineoplastic chemotherapy  Other abnormalities of gait and mobility     Problem List Patient Active Problem List   Diagnosis Date Noted   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   History of Parkinson's disease 05/16/2020   Dizziness 02/29/2020   COVID-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 hauler manifesting chronic loss of smell and  taste 02/29/2020   History of COVID-19 02/29/2020   Anemia due to antineoplastic chemotherapy 12/18/2019   Preventive measure 12/23/2018   Needs flu shot 12/03/2017   Risk for falls 11/30/2015   Essential hypertension 07/19/2015   Parkinson disease ([place]) 07/19/2015   Weight loss 11/16/2013   Tremor 11/16/2013   Elevated BUN 08/20/2013   Leg pain 08/20/2013   Need for prophylactic vaccination and inoculation against influenza  01/14/2013   Insomnia 09/23/2012   Low bone mass 09/23/2012   Mixed incontinence 09/23/2012   Vitamin D deficiency 09/23/2012   Polycythemia vera ([place]) 08/15/2011   Depression with anxiety    Polycythemia    Thrombocytosis     Marcelina Morel, DPT 04/12/2021, 5:55 PM  [place]. [place], [place], [address] Phone: [phone]   Fax:  [phone]  Name: Carolyn Figueroa MRN: [id number] Date of Birth: Jun 21, 1945

## 2021-04-17 ENCOUNTER — Ambulatory Visit: Payer: Medicare PPO | Admitting: Physical Therapy

## 2021-04-19 ENCOUNTER — Ambulatory Visit: Payer: Medicare PPO | Admitting: Physical Therapy

## 2021-04-24 ENCOUNTER — Ambulatory Visit: Payer: Medicare PPO | Admitting: Physical Therapy

## 2021-04-26 ENCOUNTER — Ambulatory Visit: Payer: Medicare Other | Admitting: Physical Therapy

## 2021-05-01 ENCOUNTER — Ambulatory Visit: Payer: Medicare Other | Admitting: Physical Therapy

## 2021-05-03 ENCOUNTER — Ambulatory Visit: Payer: Medicare Other | Admitting: Physical Therapy

## 2021-05-09 ENCOUNTER — Encounter: Payer: Self-pay | Admitting: Physical Therapy

## 2021-05-09 ENCOUNTER — Other Ambulatory Visit: Payer: Self-pay

## 2021-05-09 ENCOUNTER — Ambulatory Visit: Payer: Medicare Other | Attending: Hematology and Oncology | Admitting: Physical Therapy

## 2021-05-09 DIAGNOSIS — R2689 Other abnormalities of gait and mobility: Secondary | ICD-10-CM | POA: Diagnosis present

## 2021-05-09 DIAGNOSIS — R42 Dizziness and giddiness: Secondary | ICD-10-CM | POA: Insufficient documentation

## 2021-05-09 DIAGNOSIS — R2681 Unsteadiness on feet: Secondary | ICD-10-CM | POA: Diagnosis present

## 2021-05-09 DIAGNOSIS — R262 Difficulty in walking, not elsewhere classified: Secondary | ICD-10-CM | POA: Diagnosis present

## 2021-05-09 DIAGNOSIS — D6481 Anemia due to antineoplastic chemotherapy: Secondary | ICD-10-CM | POA: Insufficient documentation

## 2021-05-09 DIAGNOSIS — M6281 Muscle weakness (generalized): Secondary | ICD-10-CM | POA: Insufficient documentation

## 2021-05-09 DIAGNOSIS — G20 Parkinson's disease: Secondary | ICD-10-CM | POA: Insufficient documentation

## 2021-05-09 DIAGNOSIS — T451X5A Adverse effect of antineoplastic and immunosuppressive drugs, initial encounter: Secondary | ICD-10-CM | POA: Diagnosis present

## 2021-05-09 NOTE — Therapy (Signed)
[address] [place], [place], [address] Phone: [phone]   Fax:  [phone]  Physical Therapy Treatment  Patient Details  Name: Carolyn Figueroa MRN: [id number] Date of Birth: [birth year] Referring Provider (PT): Tat   Encounter Date: 05/09/2021   PT End of Session - 05/09/21 1700     Visit Number 5    Number of Visits 17    Date for PT Re-Evaluation 06/26/21    Authorization Type [employer] but reports that will change in a month to regular medicare    PT Start Time 1618    PT Stop Time 1658    PT Time Calculation (min) 40 min    Activity Tolerance Patient tolerated treatment well    Behavior During Therapy [facility] 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   Subjective Assessment - 05/09/21 1621     Subjective Doing well, less tired. Had a fall going up the stairs at home, tweaked her R shoulder but no other injuries. having some back stiffness.    Pertinent History long haul Covid along with Parkinson's, and polycythemia.   Reports dizziness mostly everyday    Patient Stated Goals be stronger, walk better, better balance, prior to her getting covid [facility]    Currently in Pain? No/denies   stiff back               [facility] PT Assessment - 05/09/21 0001       Observation/Other Assessments   Observations 106/78, HR 94                           [facility] Adult  PT Treatment/Exercise - 05/09/21 0001       Lumbar Exercises: Aerobic   Stationary Bike L4 x5 minutes BLEs      Knee/Hip Exercises: Standing   Heel Raises Both;1 set;20 reps    Hip Flexion Both;1 set;10 reps    Hip Flexion Limitations 3# each LE    Hip Abduction Both;1 set;15 reps    Abduction Limitations 3# each LE    Forward Step Up Both;1 set;15 reps    Forward Step Up Limitations 4 inch step    Other Standing Knee Exercises STS with eccentric lower 1x10 6#      Knee/Hip Exercises: Seated   Other Seated Knee/Hip Exercises UT stretches 3x30 seconds B, chin tucks 1x10, [place] press 2# rod 1x10, active ABD 1x10 0#    Other Seated Knee/Hip Exercises lats and pull downs1x10 5#                     PT Education - 05/09/21 1659     Education Details exercise form and purpose    Person(s) Educated Patient    Methods Explanation  Comprehension Verbalized understanding              PT Short Term Goals - 04/10/21 1752       PT SHORT TERM GOAL #1   Title I with basic HEP    Baseline updated today.    Time 2    Period Weeks    Status On-going    Target Date 04/24/21               PT Long Term Goals - 03/28/21 1843       PT LONG TERM GOAL #1   Title I with advanced HEP program    Time 12    Period Weeks    Status New      PT LONG TERM GOAL #2   Title Improve 5 x sit to stand test to <18seconds    Baseline 20    Time 12    Period Weeks    Status New      PT LONG TERM GOAL #3   Title Perform TUG in < 15 seconds.    Time 12    Period Weeks    Status New      PT LONG TERM GOAL #4   Title increase berg balance score to 48/56    Period Weeks    Status New      PT LONG TERM GOAL #5   Title Patient will ambulate at least 500' with LRAD and MI    Time 12    Status New                   Plan - 05/09/21 1700     Clinical Impression Statement [name] arrives today feeling well- BP much better and WNL today. Able to continue Nustep  as well as functional lower body and upper body strengthening per her request. Did introduced some upper body machine strengthening as well- generally very weak. Tolerated session well overall, seems quite motivated to get better.    Personal Factors and Comorbidities Behavior Pattern;Past/Current Experience;Fitness;Age;Comorbidity 3+    Comorbidities Parkinsons, long haul Covid, polycythemia    Examination-Activity Limitations Bathing;Locomotion Level;Lift;Stand;Bed Mobility;Bend;Sleep;Squat;Dressing;Stairs    Examination-Participation Restrictions Meal Prep;Cleaning;Driving;Community Activity    Stability/Clinical Decision Making Evolving/Moderate complexity    Clinical Decision Making Moderate    Rehab Potential Good    PT Frequency 2x / week    PT Duration Other (comment)    PT Treatment/Interventions ADLs/Self Care Home Management;Moist Heat;Iontophoresis 4mg /ml Dexamethasone;Gait training;Stair training;Functional mobility training;Therapeutic activities;Therapeutic exercise;Patient/family education;Neuromuscular re-education;Balance training;Vestibular;Visual/perceptual remediation/compensation;Manual techniques    PT Next Visit Plan progress as tolerated. Needs to make more appts, only has a couple left    PT [place] and Agree with Plan of Care Patient             Patient will benefit from skilled therapeutic intervention in order to improve the following deficits and impairments:  Pain, Decreased mobility, Postural dysfunction, Decreased strength, Decreased range of motion, Decreased endurance, Decreased activity tolerance, Decreased balance, Difficulty walking, Impaired flexibility  Visit Diagnosis: Unsteadiness on feet  Difficulty in walking, not elsewhere classified  Muscle weakness (generalized)     Problem List Patient Active Problem List   Diagnosis Date Noted   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   History of Parkinson's disease 05/16/2020    Dizziness 02/29/2020   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 02/29/2020   History of COVID-19 02/29/2020   Anemia due to antineoplastic  chemotherapy 12/18/2019   Preventive measure 12/23/2018   Needs flu shot 12/03/2017   Risk for falls 11/30/2015   Essential hypertension 07/19/2015   Parkinson disease ([place]) 07/19/2015   Weight loss 11/16/2013   Tremor 11/16/2013   Elevated BUN 08/20/2013   Leg pain 08/20/2013   Need for prophylactic vaccination and inoculation against influenza 01/14/2013   Insomnia 09/23/2012   Low bone mass 09/23/2012   Mixed incontinence 09/23/2012   Vitamin D deficiency 09/23/2012   Polycythemia vera ([place]) 08/15/2011   Depression with anxiety    Polycythemia    Thrombocytosis    Ann Lions PT, DPT, PN2   Supplemental Physical Therapist [address]. [place], [place], [address] Phone: [phone]   Fax:  [phone]  Name: Carolyn Figueroa MRN: [id number] Date of Birth: 02/09/1946

## 2021-05-15 ENCOUNTER — Ambulatory Visit: Payer: Medicare Other | Admitting: Physical Therapy

## 2021-05-17 ENCOUNTER — Ambulatory Visit: Payer: Medicare Other | Admitting: Physical Therapy

## 2021-05-17 ENCOUNTER — Other Ambulatory Visit: Payer: Self-pay

## 2021-05-17 ENCOUNTER — Encounter: Payer: Self-pay | Admitting: Physical Therapy

## 2021-05-17 DIAGNOSIS — R2681 Unsteadiness on feet: Secondary | ICD-10-CM | POA: Diagnosis not present

## 2021-05-17 DIAGNOSIS — D6481 Anemia due to antineoplastic chemotherapy: Secondary | ICD-10-CM

## 2021-05-17 DIAGNOSIS — G20 Parkinson's disease: Secondary | ICD-10-CM

## 2021-05-17 DIAGNOSIS — R262 Difficulty in walking, not elsewhere classified: Secondary | ICD-10-CM

## 2021-05-17 DIAGNOSIS — R2689 Other abnormalities of gait and mobility: Secondary | ICD-10-CM

## 2021-05-17 DIAGNOSIS — R42 Dizziness and giddiness: Secondary | ICD-10-CM

## 2021-05-17 DIAGNOSIS — M6281 Muscle weakness (generalized): Secondary | ICD-10-CM

## 2021-05-17 NOTE — Therapy (Signed)
[address] [place], [place], [address] Phone: [phone]   Fax:  [phone]  Physical Therapy Treatment  Patient Details  Name: Laramie Gelles MRN: [id number] Date of Birth: 06-27-1945 Referring Provider (PT): Tat   Encounter Date: 05/17/2021   PT End of Session - 05/17/21 1756     Visit Number 6    Number of Visits 17    Date for PT Re-Evaluation 06/26/21    Authorization Type [employer] but reports that will change in a month to regular medicare    PT Start Time 1715    PT Stop Time 1756    PT Time Calculation (min) 41 min    Activity Tolerance Patient tolerated treatment well    Behavior During Therapy [facility] for tasks assessed/performed             Past Medical History:  Diagnosis Date   Colon polyp 05/2003   Depression with anxiety    HTN (hypertension)    Insomnia    Need for prophylactic vaccination and inoculation against influenza 01/14/2013   Polycythemia    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   Subjective Assessment - 05/17/21 1719     Subjective Patient reports she remains very tired. She also reports chronic back pain which has flared up again. It hurts when she bends forward. She reports leg pain, but has neuropathy and thinks that is the cause of the leg pain.    Pertinent History long haul Covid along with Parkinson's, and polycythemia.   Reports dizziness mostly everyday    Currently in Pain? No/denies                               [facility] Adult PT Treatment/Exercise - 05/17/21 0001       Lumbar Exercises: Stretches   Active Hamstring Stretch Limitations B SLR to 75 degrees without C/O pain.    Single Knee to  Chest Stretch 2 reps;30 seconds    Double Knee to Chest Stretch 3 reps;20 seconds    Lower Trunk Rotation 5 reps;10 seconds      Lumbar Exercises: Aerobic   Nustep l4 x 5 minutes.      Lumbar Exercises: Supine   Clam 10 reps;2 seconds    Clam Limitations G Tband.    Bridge Non-compliant;10 reps;2 seconds    Bridge Limitations G Tband.                       PT Short Term Goals - 05/17/21 1735       PT SHORT TERM GOAL #1   Title I with basic HEP    Baseline Limited performance due to fatigue.    Time 2    Period Weeks    Status On-going    Target Date 05/31/21               PT Long Term Goals - 05/17/21 1738       PT LONG TERM GOAL #2   Title Improve 5 x sit to stand test to <18seconds    Baseline 13.15  Status Achieved      PT LONG TERM GOAL #3   Title Perform TUG in < 15 seconds.    Baseline 16.48    Time 8    Period Weeks    Status On-going    Target Date 06/26/21      PT LONG TERM GOAL #5   Title Patient will ambulate at least 500' with LRAD and MI    Baseline 100'    Time 8    Period Weeks    Status On-going    Target Date 06/26/21                   Plan - 05/17/21 1737     Clinical Impression Statement Patient reports multiple Complaints including fatigue, dizziness, shakiness, no appetite. She also reports discharge in her urine- told to call her Dr regarding the urine. She has been inconsistent with HEp, but does demonstrate improvement toward LTG. Therapist encouraged patient to increase activity at home and follow up with her Dr regarding the urine. Plan to assess dizziness on next visit.    Personal Factors and Comorbidities Behavior Pattern;Past/Current Experience;Fitness;Age;Comorbidity 3+    Comorbidities Parkinsons, long haul Covid, polycythemia    Examination-Activity Limitations Bathing;Locomotion Level;Lift;Stand;Bed Mobility;Bend;Sleep;Squat;Dressing;Stairs    Examination-Participation Restrictions Meal  Prep;Cleaning;Driving;Community Activity    Stability/Clinical Decision Making Evolving/Moderate complexity    Rehab Potential Good    PT Frequency 2x / week    PT Duration Other (comment)    PT Treatment/Interventions ADLs/Self Care Home Management;Moist Heat;Iontophoresis 4mg /ml Dexamethasone;Gait training;Stair training;Functional mobility training;Therapeutic activities;Therapeutic exercise;Patient/family education;Neuromuscular re-education;Balance training;Vestibular;Visual/perceptual remediation/compensation;Manual techniques    PT Next Visit Plan Vestibular assessment.    PT Home Exercise Plan [id number]    Consulted and Agree with Plan of Care Patient    Family Member Consulted daughter             Patient will benefit from skilled therapeutic intervention in order to improve the following deficits and impairments:  Pain, Decreased mobility, Postural dysfunction, Decreased strength, Decreased range of motion, Decreased endurance, Decreased activity tolerance, Decreased balance, Difficulty walking, Impaired flexibility  Visit Diagnosis: Unsteadiness on feet  Difficulty in walking, not elsewhere classified  Muscle weakness (generalized)  Dizziness and giddiness  Parkinson disease (HCC)  Anemia due to antineoplastic chemotherapy  Other abnormalities of gait and mobility     Problem List Patient Active Problem List   Diagnosis Date Noted   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   History of Parkinson's disease 05/16/2020   Dizziness 02/29/2020   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 02/29/2020   History of COVID-19 02/29/2020   Anemia due to antineoplastic chemotherapy 12/18/2019   Preventive measure 12/23/2018   Needs flu shot 12/03/2017   Risk for falls 11/30/2015   Essential hypertension 07/19/2015   Parkinson disease ([place]) 07/19/2015   Weight loss 11/16/2013   Tremor 11/16/2013   Elevated BUN 08/20/2013   Leg pain 08/20/2013   Need for prophylactic  vaccination and inoculation against influenza 01/14/2013   Insomnia 09/23/2012   Low bone mass 09/23/2012   Mixed incontinence 09/23/2012   Vitamin D deficiency 09/23/2012   Polycythemia vera ([place]) 08/15/2011   Depression with anxiety    Polycythemia    Thrombocytosis     Marcelina Morel, DPT 05/17/2021, 6:01 PM  [address] [place], [place], [address] Phone: [phone]   Fax:  [phone]  Name: Littie Chiem MRN: [id number] Date of Birth: 1946/03/19

## 2021-05-26 ENCOUNTER — Telehealth: Payer: Self-pay | Admitting: Neurology

## 2021-05-26 ENCOUNTER — Other Ambulatory Visit: Payer: Self-pay

## 2021-05-26 DIAGNOSIS — G20 Parkinson's disease: Secondary | ICD-10-CM

## 2021-05-26 NOTE — Telephone Encounter (Signed)
Patient's daughter Rodena Piety called and said the patient will slowly be transitioning to live with her brother Ulice Dash that lives in [place], [place]. ? ?To that end, she is requesting a transfer of care to the [facility] movement disorders clinic in [place], [place]. ? ?Rodena Piety will call back with a specific doctor's name,if there is one, after speaking with her brother. ?

## 2021-05-26 NOTE — Telephone Encounter (Signed)
I have printed and faxed referral to the [facility] movement disorder clinic ?Phone # [phone] ?Fax# [phone] ?Called daughter to let her know I am sending the referral but she can always call and give me a particular doctors name   ?

## 2021-05-29 DIAGNOSIS — G20 Parkinson's disease: Principal | ICD-10-CM

## 2021-05-30 ENCOUNTER — Encounter: Payer: Self-pay | Admitting: Physical Therapy

## 2021-05-30 ENCOUNTER — Ambulatory Visit: Payer: Medicare Other | Attending: Hematology and Oncology | Admitting: Physical Therapy

## 2021-05-30 ENCOUNTER — Other Ambulatory Visit: Payer: Self-pay

## 2021-05-30 DIAGNOSIS — R2681 Unsteadiness on feet: Secondary | ICD-10-CM | POA: Insufficient documentation

## 2021-05-30 DIAGNOSIS — R262 Difficulty in walking, not elsewhere classified: Secondary | ICD-10-CM | POA: Insufficient documentation

## 2021-05-30 DIAGNOSIS — M6281 Muscle weakness (generalized): Secondary | ICD-10-CM | POA: Diagnosis present

## 2021-05-30 NOTE — Therapy (Signed)
[facility] ?[place], [place], [address] ?Phone: [phone]   Fax:  [phone] ? ?Physical Therapy Treatment ? ?Patient Details  ?Name: Carolyn Figueroa ?MRN: [id number] ?Date of Birth: 1945-05-24 ?Referring Provider (PT): Tat ? ? ?Encounter Date: 05/30/2021 ? ? PT End of Session - 05/30/21 1528   ? ? Visit Number 7   ? Number of Visits 17   ? Date for PT Re-Evaluation 06/26/21   ? Authorization Type [employer] but reports that will change in a month to regular medicare   ? PT Start Time 7902   ? PT Stop Time 4097   ? PT Time Calculation (min) 40 min   ? Activity Tolerance Patient tolerated treatment well   ? Behavior During Therapy [facility] for tasks assessed/performed   ? ?  ?  ? ?  ? ? ?Past Medical History:  ?Diagnosis Date  ? Colon polyp 05/2003  ? Depression with anxiety   ? HTN (hypertension)   ? Insomnia   ? Need for prophylactic vaccination and inoculation against influenza 01/14/2013  ? Polycythemia   ? 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 Thrombocytosis   ? Tremor 11/16/2013  ? Varicose vein   ? Vitamin D deficiency   ? Vulvar candidiasis   ? Weight loss 11/16/2013  ? ? ?Past Surgical History:  ?Procedure Laterality Date  ? NO PAST SURGERIES    ? ? ?There were no vitals filed for this visit. ? ? Subjective Assessment - 05/30/21 1448   ? ? Subjective I'm feeling better but chronic back pain is still flared up. Hard for me to bend and feed the dogs. Its hard for me to bend.   ? Pertinent History long haul Covid along with Parkinson's, and polycythemia.   Reports dizziness mostly everyday   ? Patient Stated Goals be stronger, walk better, better balance, [facility] to [facility]   ? Currently in Pain? Yes   first says she has pain then says no pain  ? Pain Score 8    ? Pain Location Back   ? Pain Orientation Right;Lower   ? Pain Descriptors / Indicators Discomfort;Sharp   ? Pain Type Chronic pain   ? ?  ?  ? ?   ? ? ? ? ? ? ? ? ? ? ? ? ? ? ? ? ? ? ? ? [facility] Adult PT Treatment/Exercise - 05/30/21 0001   ? ?  ? Lumbar Exercises: Stretches  ? Other Lumbar Stretch Exercise QL stretch 3x30 seconds   ? Other Lumbar Stretch Exercise lx rotation stretch 3x10 seconds B   ?  ? Lumbar Exercises: Aerobic  ? Nustep L4 x6 minutes   ?  ? Knee/Hip Exercises: Standing  ? Other Standing Knee Exercises forwards/backwards/lateral walking outside over grass and "gumballs" x4 laps   ? ?  ?  ? ?  ? ? ? ? ? ? ? ? ? ? PT Education - 05/30/21 1527   ? ? Education Details implications of BP being too low but still with negative orthostatic testes today; central vertigo tests negative, POC for BPPV testing next session   ? Person(s) Educated Patient   ? Methods Explanation   ? Comprehension Verbalized understanding   ? ?  ?  ? ?  ? ? ? PT Short Term Goals - 05/17/21 1735   ? ?  ? PT SHORT TERM GOAL #1  ? Title I with  basic HEP   ? Baseline Limited performance due to fatigue.   ? Time 2   ? Period Weeks   ? Status On-going   ? Target Date 05/31/21   ? ?  ?  ? ?  ? ? ? ? PT Long Term Goals - 05/17/21 1738   ? ?  ? PT LONG TERM GOAL #2  ? Title Improve 5 x sit to stand test to <18seconds   ? Baseline 13.15   ? Status Achieved   ?  ? PT LONG TERM GOAL #3  ? Title Perform TUG in < 15 seconds.   ? Baseline 16.48   ? Time 8   ? Period Weeks   ? Status On-going   ? Target Date 06/26/21   ?  ? PT LONG TERM GOAL #5  ? Title Patient will ambulate at least 500' with LRAD and MI   ? Baseline 100'   ? Time 8   ? Period Weeks   ? Status On-going   ? Target Date 06/26/21   ? ?  ?  ? ?  ? ? ? ? ? ? ? ? Plan - 05/30/21 1529   ? ? Clinical Impression Statement Ms. Maslin arrives today feeling better but her back is still hurting her; feels a bit dizzy, we took modified orthostatics as follows:   ? ?Sitting: 97/78 HR 91 ?Sitting x2: 87/71 HR 91 ?Standing 108/81 92 ?Standing x3 minutes 114/81 92  ? ?  We then warmed up on Nustep and worked on stretching her back a bit  before heading outside to work on balance and activity tolerance over uneven surfaces. Central vertigo testing negative, would still benefit from BPPV rule out. Her BP was a bit low today, I do have to wonder if dizziness is related to this.  Will continue efforts.   ? Personal Factors and Comorbidities Behavior Pattern;Past/Current Experience;Fitness;Age;Comorbidity 3+   ? Comorbidities Parkinsons, long haul Covid, polycythemia   ? Examination-Activity Limitations Bathing;Locomotion Level;Lift;Stand;Bed Mobility;Bend;Sleep;Squat;Dressing;Stairs   ? Examination-Participation Restrictions Meal Prep;Cleaning;Driving;Community Activity   ? Stability/Clinical Decision Making Evolving/Moderate complexity   ? Clinical Decision Making Moderate   ? Rehab Potential Good   ? PT Frequency 2x / week   ? PT Duration Other (comment)   ? PT Treatment/Interventions ADLs/Self Care Home Management;Moist Heat;Iontophoresis '4mg'$ /ml Dexamethasone;Gait training;Stair training;Functional mobility training;Therapeutic activities;Therapeutic exercise;Patient/family education;Neuromuscular re-education;Balance training;Vestibular;Visual/perceptual remediation/compensation;Manual techniques   ? PT Next Visit Plan BPPV testing (central testing negative)   ? PT Home Exercise Plan [id number]   ? Consulted and Agree with Plan of Care Patient   ? ?  ?  ? ?  ? ? ?Patient will benefit from skilled therapeutic intervention in order to improve the following deficits and impairments:  Pain, Decreased mobility, Postural dysfunction, Decreased strength, Decreased range of motion, Decreased endurance, Decreased activity tolerance, Decreased balance, Difficulty walking, Impaired flexibility ? ?Visit Diagnosis: ?Unsteadiness on feet ? ?Difficulty in walking, not elsewhere classified ? ?Muscle weakness (generalized) ? ? ? ? ?Problem List ?Patient Active Problem List  ? Diagnosis Date Noted  ? 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  ? History of Parkinson's disease 05/16/2020  ?  Dizziness 02/29/2020  ? 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 History of COVID-19 02/29/2020  ? Anemia due to antineoplastic chemotherapy 12/18/2019  ? Preventive measure 12/23/2018  ? Needs flu shot 12/03/2017  ? Risk for falls 11/30/2015  ? Essential hypertension 07/19/2015  ? Parkinson disease ([place]) 07/19/2015  ? Weight  loss 11/16/2013  ? Tremor 11/16/2013  ? Elevated BUN 08/20/2013  ? Leg pain 08/20/2013  ? Need for prophylactic vaccination and inoculation against influenza 01/14/2013  ? Insomnia 09/23/2012  ? Low bone mass 09/23/2012  ? Mixed incontinence 09/23/2012  ? Vitamin D deficiency 09/23/2012  ? Polycythemia vera ([place]) 08/15/2011  ? Depression with anxiety   ? Polycythemia   ? Thrombocytosis   ? ?Roe Koffman U PT, DPT, PN2  ? ?Supplemental Physical Therapist ?[facility]  ? ? ? ? ? ? ?[address] ?[address] ?[place], [place], [address] ?Phone: [phone]   Fax:  [phone] ? ?Name: Annalisa Colonna Harker ?MRN: [id number] ?Date of Birth: 1945/11/19 ? ? ? ?

## 2021-06-05 ENCOUNTER — Ambulatory Visit: Payer: Medicare Other | Admitting: Physical Therapy

## 2021-06-08 ENCOUNTER — Ambulatory Visit: Payer: Medicare Other | Admitting: Physical Therapy

## 2021-06-12 ENCOUNTER — Ambulatory Visit: Payer: Medicare Other | Admitting: Physical Therapy

## 2021-06-13 ENCOUNTER — Telehealth: Payer: Self-pay

## 2021-06-13 NOTE — Telephone Encounter (Signed)
Daughter in law called. Carolyn Figueroa will keep scheduled appt 4/4 with Dr. Alvy Bimler. In April she will move to [place] to live with family son and daughter-in-law. ? ? ?Requesting referral to [facility] at [facility] location phone # [phone]. Faxed referral to [phone], received confirmation. ?

## 2021-06-15 ENCOUNTER — Other Ambulatory Visit: Payer: Self-pay | Admitting: Hematology and Oncology

## 2021-06-15 ENCOUNTER — Other Ambulatory Visit: Payer: Self-pay | Admitting: Neurology

## 2021-06-15 ENCOUNTER — Ambulatory Visit: Payer: Medicare Other | Admitting: Physical Therapy

## 2021-06-15 DIAGNOSIS — G20A1 Parkinson's disease without dyskinesia, without mention of fluctuations: Secondary | ICD-10-CM

## 2021-06-15 DIAGNOSIS — G20 Parkinson's disease: Secondary | ICD-10-CM

## 2021-06-15 DIAGNOSIS — D45 Polycythemia vera: Secondary | ICD-10-CM

## 2021-06-16 ENCOUNTER — Other Ambulatory Visit: Payer: Self-pay

## 2021-06-19 ENCOUNTER — Ambulatory Visit: Payer: Medicare Other | Admitting: Physical Therapy

## 2021-06-22 ENCOUNTER — Ambulatory Visit: Payer: Medicare Other | Admitting: Physical Therapy

## 2021-06-27 ENCOUNTER — Inpatient Hospital Stay: Payer: Medicare Other | Attending: Hematology and Oncology

## 2021-06-27 ENCOUNTER — Other Ambulatory Visit: Payer: Self-pay

## 2021-06-27 ENCOUNTER — Inpatient Hospital Stay (HOSPITAL_BASED_OUTPATIENT_CLINIC_OR_DEPARTMENT_OTHER): Payer: Medicare Other | Admitting: Hematology and Oncology

## 2021-06-27 DIAGNOSIS — G20 Parkinson's disease: Secondary | ICD-10-CM | POA: Insufficient documentation

## 2021-06-27 DIAGNOSIS — R634 Abnormal weight loss: Secondary | ICD-10-CM | POA: Diagnosis not present

## 2021-06-27 DIAGNOSIS — D45 Polycythemia vera: Secondary | ICD-10-CM

## 2021-06-27 DIAGNOSIS — D61818 Other pancytopenia: Secondary | ICD-10-CM

## 2021-06-27 LAB — CBC WITH DIFFERENTIAL/PLATELET
Abs Immature Granulocytes: 0.01 10*3/uL (ref 0.00–0.07)
Basophils Absolute: 0 10*3/uL (ref 0.0–0.1)
Basophils Relative: 1 %
Eosinophils Absolute: 0.1 10*3/uL (ref 0.0–0.5)
Eosinophils Relative: 3 %
HCT: 34 % — ABNORMAL LOW (ref 36.0–46.0)
Hemoglobin: 10.9 g/dL — ABNORMAL LOW (ref 12.0–15.0)
Immature Granulocytes: 0 %
Lymphocytes Relative: 29 %
Lymphs Abs: 1 10*3/uL (ref 0.7–4.0)
MCH: 34.1 pg — ABNORMAL HIGH (ref 26.0–34.0)
MCHC: 32.1 g/dL (ref 30.0–36.0)
MCV: 106.3 fL — ABNORMAL HIGH (ref 80.0–100.0)
Monocytes Absolute: 0.2 10*3/uL (ref 0.1–1.0)
Monocytes Relative: 6 %
Neutro Abs: 2 10*3/uL (ref 1.7–7.7)
Neutrophils Relative %: 61 %
Platelets: 291 10*3/uL (ref 150–400)
RBC: 3.2 MIL/uL — ABNORMAL LOW (ref 3.87–5.11)
RDW: 14.1 % (ref 11.5–15.5)
WBC: 3.3 10*3/uL — ABNORMAL LOW (ref 4.0–10.5)
nRBC: 0 % (ref 0.0–0.2)

## 2021-06-27 MED ORDER — HYDROXYUREA 500 MG PO CAPS: 500.0000 mg | ORAL_CAPSULE | Freq: Every day | ORAL | Status: AC

## 2021-06-28 ENCOUNTER — Encounter: Payer: Self-pay | Admitting: Hematology and Oncology

## 2021-06-28 DIAGNOSIS — D61818 Other pancytopenia: Secondary | ICD-10-CM | POA: Insufficient documentation

## 2021-06-28 NOTE — Progress Notes (Signed)
[facility] ?OFFICE PROGRESS NOTE ? ?Patient Care Team: ?[name], MD as PCP - General (Family Medicine) ?Christy Sartorius, MD as Referring Physician (Urology) ?Avel Sensor, MD as Attending Physician (Obstetrics and Gynecology) ?Heath Lark, MD as Consulting Physician (Hematology and Oncology) ?Ludwig Clarks, DO as Consulting Physician (Neurology) ? ?ASSESSMENT & PLAN:  ?Polycythemia vera ([place]) ?She has lost weight and this has affected her blood count ?I recommend changes to hydroxyurea ?Moving forward, she will take hydroxyurea at 500 mg daily ?She needs her blood count rechecked in the next 2 months ?Since the patient has decided to relocate to [facility], I have not made a return appointment to see her back ?She will continue aspirin therapy to prevent risk of blood clots ? ?Pancytopenia, acquired ([place]) ?She has developed pancytopenia likely secondary to current dose of hydroxyurea ?As above, plan to reduce the dose ?She will need her blood count rechecked within the next 2 months ? ?Weight loss, abnormal ?She has lost 6 pounds since the last time she was seen in December ?Overall, I felt that she have lost muscle mass due to her sedentary lifestyle and reduce oral intake ?We discussed the importance of frequent small meals and graduated exercise as tolerated ? ?No orders of the defined types were placed in this encounter. ? ? ?All questions were answered. The patient knows to call the clinic with any problems, questions or concerns. ?The total time spent in the appointment was 20 minutes encounter with patients including review of chart and various tests results, discussions about plan of care and coordination of care plan ?  ?Heath Lark, MD ?06/28/2021 12:58 PM ? ?INTERVAL HISTORY: ?Please see below for problem oriented charting. ?she returns for treatment follow-up on hydroxyurea for polycythemia vera ?She is here accompanied by her daughter ?The patient is planning to relocate to  [facility] within the next few weeks ?I have recently refill her hydroxyurea prescription ?She have lost some weight since last time I saw her due to reduced oral intake and lack of exercise ?Due to her Parkinson's disease, it appears that the patient is fearful that she might fall ?She is not using a walker on a regular basis ?She has declined physical therapy and rehab ?She denies recent infection, fever or chills ?No recent bleeding ? ?REVIEW OF SYSTEMS:   ?Constitutional: Denies fevers, chills  ?Eyes: Denies blurriness of vision ?Ears, nose, mouth, throat, and face: Denies mucositis or sore throat ?Respiratory: Denies cough, dyspnea or wheezes ?Cardiovascular: Denies palpitation, chest discomfort or lower extremity swelling ?Gastrointestinal:  Denies nausea, heartburn or change in bowel habits ?Skin: Denies abnormal skin rashes ?Lymphatics: Denies new lymphadenopathy or easy bruising ?Neurological:Denies numbness, tingling or new weaknesses ?Behavioral/Psych: Mood is stable, no new changes  ?All other systems were reviewed with the patient and are negative. ? ?I have reviewed the past medical history, past surgical history, social history and family history with the patient and they are unchanged from previous note. ? ?ALLERGIES:  has No Known Allergies. ? ?MEDICATIONS:  ?Current Outpatient Medications  ?Medication Sig Dispense Refill  ? carbidopa-levodopa (SINEMET IR) 25-100 MG tablet TAKE TWO TABLETS BY MOUTH THREE TIMES A DAY 540 tablet 0  ? fesoterodine (TOVIAZ) 4 MG TB24 tablet Take by mouth.    ? lisinopril (ZESTRIL) 5 MG tablet Take 1 tablet by mouth daily.    ? aspirin 81 MG tablet Take 81 mg by mouth daily.    ? carbidopa-levodopa (SINEMET CR) 50-200 MG tablet Take  1 tablet by mouth at bedtime. 90 tablet 1  ? gabapentin (NEURONTIN) 100 MG capsule Take by mouth 2 (two) times daily.    ? hydroxyurea (HYDREA) 500 MG capsule Take 1 capsule (500 mg total) by mouth daily.    ? levothyroxine (SYNTHROID) 25 MCG  tablet Take 25 mcg by mouth daily before breakfast.    ? meclizine (ANTIVERT) 12.5 MG tablet Take 12.5 mg by mouth 3 (three) times daily as needed for dizziness.    ? Multiple Vitamin (MULTIVITAMIN WITH MINERALS) TABS tablet Take 1 tablet by mouth daily.    ? naloxone [facility]) 2 MG/2ML injection Inject into the vein as needed.    ? sertraline (ZOLOFT) 50 MG tablet Take 0.5 tablets by mouth 2 (two) times daily.    ? tolterodine (DETROL LA) 2 MG 24 hr capsule Take 2 mg by mouth daily.    ? ?No current facility-administered medications for this visit.  ? ? ?SUMMARY OF ONCOLOGIC HISTORY: ?Oncology History  ?Polycythemia vera ([place])  ?08/15/2011 Initial Diagnosis  ? Polycythemia vera ([place]) ? 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 The patient never had bone marrow aspirate and biopsy. ?  ?09/30/2013 -  Chemotherapy  ? On 09/30/2013, hydroxyurea dose was increased to 1000 mg on Mondays, Wednesdays and Fridays and 2 take 500 mg the rest of the week. ?On 10/22/2013, the dose of hydroxyurea was increased to 1000 mg daily along with phlebotomy. ?On August 2015, the dose of hydroxyurea was modified to 1000 mg daily Mondays to Fridays and 500 mg on Saturdays and Sunday.  However, the patient was not following instruction correctly. ?On 07/20/2014, the dose of hydroxyurea was modifed to 500 mg daily Mondays to Fridays and 1000 mg on Saturdays and Sundays ?On 02/08/2015, the dose of hydroxyurea was modified to 500 milligrams daily Mondays to Saturdays and 1000 mg on Sundays. ?She was referred to neurologist in 2016 and was diagnosed with Parkinson's disease ?On 07/17/2016, the dose of hydroxyurea is adjusted.  She takes 500 mg daily except for 2 days a week on Wednesdays and Sundays she take 1000 mg ?Starting 11/14/2016, the dose of hydroxyurea is suggested.  She is instructed to take 1000 mg  on Mondays, Wednesdays and Fridays and take 500 mg daily for the rest of the week ?Starting 12/18/2019, the dose of hydroxyurea is changed.  She is instructed to take 1000 mg on Mondays and Fridays and take 500 mg daily for the rest of the week ?On 06/28/2021, hydroxyurea dose is reduced to 500 mg daily ? ?  ? ?  ? ? ?PHYSICAL EXAMINATION: ?ECOG PERFORMANCE STATUS: 1 - Symptomatic but completely ambulatory ? ?Vitals:  ? 06/27/21 1229  ?BP: 122/75  ?Pulse: 79  ?Resp: 18  ?Temp: 98.2 ?F (36.8 ?C)  ?SpO2: 100%  ? ?Filed Weights  ? 06/27/21 1229  ?Weight: 123 lb 12.8 oz (56.2 kg)  ? ? ?GENERAL:alert, no distress and comfortable ?NEURO: alert & oriented x 3 with fluent speech, no focal motor/sensory deficits ? ?LABORATORY DATA:  ?I have reviewed the data as listed ?   ?Component Value Date/Time  ? NA 139 11/16/2013 1404  ? K 4.0 11/16/2013 1404  ? CL 102 07/30/2012 1419  ? CO2 28 11/16/2013 1404  ? GLUCOSE 89 11/16/2013 1404  ? GLUCOSE 102 (H) 07/30/2012 1419  ? BUN 30.0 (H) 11/16/2013 1404  ? CREATININE 0.9 11/16/2013 1404  ?  CALCIUM 9.1 11/16/2013 1404  ? PROT 6.7 11/16/2013 1404  ? ALBUMIN 4.0 11/16/2013 1404  ? AST 15 11/16/2013 1404  ? ALT <6 11/16/2013 1404  ? ALKPHOS 49 11/16/2013 1404  ? BILITOT 1.45 (H) 11/16/2013 1404  ? GFRNONAA 58 (L) 11/22/2009 1855  ? GFRAA  11/22/2009 1855  ?  >60        ?The eGFR has been calculated ?using the MDRD equation. ?This calculation has not been ?validated in all clinical ?situations. ?eGFR's persistently ?<60 mL/min signify ?possible Chronic Kidney Disease.  ? ? ?No results found for: SPEP, UPEP ? ?Lab Results  ?Component Value Date  ? WBC 3.3 (L) 06/27/2021  ? NEUTROABS 2.0 06/27/2021  ? HGB 10.9 (L) 06/27/2021  ? HCT 34.0 (L) 06/27/2021  ? MCV 106.3 (H) 06/27/2021  ? PLT 291 06/27/2021  ? ? ?  Chemistry   ?   ?Component Value Date/Time  ? NA 139 11/16/2013 1404  ? K 4.0 11/16/2013 1404  ? CL 102 07/30/2012 1419  ? CO2 28 11/16/2013 1404  ? BUN 30.0 (H) 11/16/2013 1404  ?  CREATININE 0.9 11/16/2013 1404  ?    ?Component Value Date/Time  ? CALCIUM 9.1 11/16/2013 1404  ? ALKPHOS 49 11/16/2013 1404  ? AST 15 11/16/2013 1404  ? ALT <6 11/16/2013 1404  ? BILITOT 1.45 (H) 11/16/2013 1404

## 2021-06-28 NOTE — Assessment & Plan Note (Signed)
She has developed pancytopenia likely secondary to current dose of hydroxyurea ?As above, plan to reduce the dose ?She will need her blood count rechecked within the next 2 months ?

## 2021-06-28 NOTE — Assessment & Plan Note (Signed)
She has lost 6 pounds since the last time she was seen in December ?Overall, I felt that she have lost muscle mass due to her sedentary lifestyle and reduce oral intake ?We discussed the importance of frequent small meals and graduated exercise as tolerated ?

## 2021-06-28 NOTE — Assessment & Plan Note (Signed)
She has lost weight and this has affected her blood count ?I recommend changes to hydroxyurea ?Moving forward, she will take hydroxyurea at 500 mg daily ?She needs her blood count rechecked in the next 2 months ?Since the patient has decided to relocate to [facility], I have not made a return appointment to see her back ?She will continue aspirin therapy to prevent risk of blood clots ?

## 2021-08-24 ENCOUNTER — Ambulatory Visit: Admit: 2021-08-24 | Discharge: 2021-08-25 | Payer: MEDICARE | Attending: Neurology | Primary: Neurology

## 2021-08-24 DIAGNOSIS — G20 Parkinson's disease: Principal | ICD-10-CM

## 2021-08-24 MED ORDER — CARBIDOPA 25 MG-LEVODOPA 100 MG TABLET
ORAL_TABLET | Freq: Three times a day (TID) | ORAL | 3 refills | 90 days | Status: CP
Start: 2021-08-24 — End: ?

## 2021-08-24 MED ORDER — MIRTAZAPINE 15 MG TABLET
ORAL_TABLET | 3 refills | 0 days | Status: CP
Start: 2021-08-24 — End: ?

## 2021-08-24 MED ORDER — CARBIDOPA ER 50 MG-LEVODOPA 200 MG TABLET,EXTENDED RELEASE
ORAL_TABLET | Freq: Every evening | ORAL | 3 refills | 90 days | Status: CP
Start: 2021-08-24 — End: ?

## 2021-08-31 ENCOUNTER — Ambulatory Visit: Payer: Medicare Other | Admitting: Neurology

## 2021-10-06 ENCOUNTER — Ambulatory Visit
Admit: 2021-10-06 | Discharge: 2021-10-07 | Payer: MEDICARE | Attending: Student in an Organized Health Care Education/Training Program | Primary: Student in an Organized Health Care Education/Training Program

## 2021-10-06 ENCOUNTER — Ambulatory Visit: Admit: 2021-10-06 | Discharge: 2021-10-07 | Payer: MEDICARE

## 2021-10-06 DIAGNOSIS — D45 Polycythemia vera: Principal | ICD-10-CM

## 2021-10-10 DIAGNOSIS — D45 Polycythemia vera: Principal | ICD-10-CM

## 2021-10-10 DIAGNOSIS — D75839 Thrombocythemia: Principal | ICD-10-CM

## 2021-10-10 MED ORDER — HYDROXYUREA 500 MG CAPSULE
ORAL_CAPSULE | Freq: Every day | ORAL | 6 refills | 30 days | Status: CP
Start: 2021-10-10 — End: ?

## 2021-10-11 DIAGNOSIS — D45 Polycythemia vera: Principal | ICD-10-CM

## 2021-10-11 MED ORDER — HYDROXYUREA 500 MG CAPSULE
ORAL_CAPSULE | Freq: Every day | ORAL | 6 refills | 30 days | Status: CP
Start: 2021-10-11 — End: ?

## 2021-10-11 NOTE — Unmapped (Signed)
[facility] Specialty Medication Onboarding    Specialty Medication: Hydroxyurea  Prior Authorization: Not Required   Financial Assistance: No - copay  <$25  Final Copay/Day Supply: $11.98 / 30    Insurance Restrictions: None     Notes to Pharmacist:     The triage team has completed the benefits investigation and has determined that the patient is able to fill this medication at [facility]. Please contact the patient to complete the onboarding or follow up with the prescribing physician as needed.

## 2021-10-11 NOTE — Unmapped (Signed)
Specialty Medication(s): Hydroxyurea    Ms.Palmieri has been dis-enrolled from the [facility] specialty pharmacy services due to a pharmacy change. The patient is now filling at [facility].    Additional information provided to the patient: no    Rollen Sox  [facility] Specialty Pharmacist

## 2021-10-12 NOTE — Unmapped (Signed)
Patient prefers to have her hydroxyurea prescription filled at [facility].   Rx routed as per patient request.

## 2021-12-11 DIAGNOSIS — D45 Polycythemia vera: Principal | ICD-10-CM

## 2021-12-12 ENCOUNTER — Ambulatory Visit: Admit: 2021-12-12 | Discharge: 2021-12-12 | Payer: MEDICARE | Attending: Adult Health | Primary: Adult Health

## 2021-12-12 ENCOUNTER — Ambulatory Visit: Admit: 2021-12-12 | Discharge: 2021-12-12 | Payer: MEDICARE

## 2021-12-12 DIAGNOSIS — D45 Polycythemia vera: Principal | ICD-10-CM

## 2022-01-09 ENCOUNTER — Ambulatory Visit: Admit: 2022-01-09 | Discharge: 2022-01-10 | Payer: MEDICARE | Attending: Family | Primary: Family

## 2022-01-09 DIAGNOSIS — G20A1 Parkinson's disease, unspecified whether dyskinesia present, unspecified whether manifestations fluctuate: Principal | ICD-10-CM

## 2022-04-05 ENCOUNTER — Ambulatory Visit: Admit: 2022-04-05 | Discharge: 2022-04-06 | Payer: MEDICARE | Attending: Neurology | Primary: Neurology

## 2022-04-05 MED ORDER — MIRTAZAPINE 30 MG TABLET
ORAL_TABLET | Freq: Every evening | ORAL | 2 refills | 30 days | Status: CP
Start: 2022-04-05 — End: 2023-04-05

## 2022-05-21 DIAGNOSIS — D45 Polycythemia vera: Principal | ICD-10-CM

## 2022-05-21 MED ORDER — HYDROXYUREA 500 MG CAPSULE
ORAL_CAPSULE | Freq: Every day | ORAL | 6 refills | 30 days | Status: CP
Start: 2022-05-21 — End: ?

## 2022-06-11 DIAGNOSIS — D45 Polycythemia vera: Principal | ICD-10-CM

## 2022-06-12 ENCOUNTER — Ambulatory Visit: Admit: 2022-06-12 | Discharge: 2022-06-12 | Payer: MEDICARE

## 2022-06-12 ENCOUNTER — Ambulatory Visit: Admit: 2022-06-12 | Discharge: 2022-06-12 | Payer: MEDICARE | Attending: Adult Health | Primary: Adult Health

## 2022-06-12 DIAGNOSIS — R300 Dysuria: Principal | ICD-10-CM

## 2022-06-12 DIAGNOSIS — Z8639 Personal history of other endocrine, nutritional and metabolic disease: Principal | ICD-10-CM

## 2022-06-12 DIAGNOSIS — D45 Polycythemia vera: Principal | ICD-10-CM

## 2022-06-15 DIAGNOSIS — N3000 Acute cystitis without hematuria: Principal | ICD-10-CM

## 2022-06-15 MED ORDER — NITROFURANTOIN MONOHYDRATE/MACROCRYSTALS 100 MG CAPSULE
ORAL_CAPSULE | Freq: Two times a day (BID) | ORAL | 0 refills | 7 days | Status: CP
Start: 2022-06-15 — End: 2022-06-22

## 2022-06-25 ENCOUNTER — Ambulatory Visit: Admit: 2022-06-25 | Discharge: 2022-06-26 | Payer: MEDICARE

## 2022-06-25 DIAGNOSIS — Z01810 Encounter for preprocedural cardiovascular examination: Principal | ICD-10-CM

## 2022-07-23 MED ORDER — MIRTAZAPINE 30 MG TABLET
ORAL_TABLET | Freq: Every evening | ORAL | 2 refills | 0 days
Start: 2022-07-23 — End: ?

## 2022-07-26 MED ORDER — MIRTAZAPINE 30 MG TABLET
ORAL_TABLET | Freq: Every evening | ORAL | 2 refills | 30 days
Start: 2022-07-26 — End: 2023-07-26

## 2022-07-27 MED ORDER — MIRTAZAPINE 30 MG TABLET
ORAL_TABLET | Freq: Every evening | ORAL | 2 refills | 30 days | Status: CP
Start: 2022-07-27 — End: 2023-07-27

## 2022-07-28 IMAGING — MG MM DIGITAL SCREENING BILAT W/ TOMO AND CAD
6 of 12 series · 6 of 36 positions shown · non-contrast
Comparison: Previous exam(s).

CLINICAL DATA: Screening.

EXAM:
DIGITAL SCREENING BILATERAL MAMMOGRAM WITH TOMOSYNTHESIS AND CAD
TECHNIQUE: Bilateral screening digital craniocaudal and mediolateral oblique
mammograms were obtained. Bilateral screening digital breast
tomosynthesis was performed. The images were evaluated with
computer-aided detection.

[R CC synth-2D]
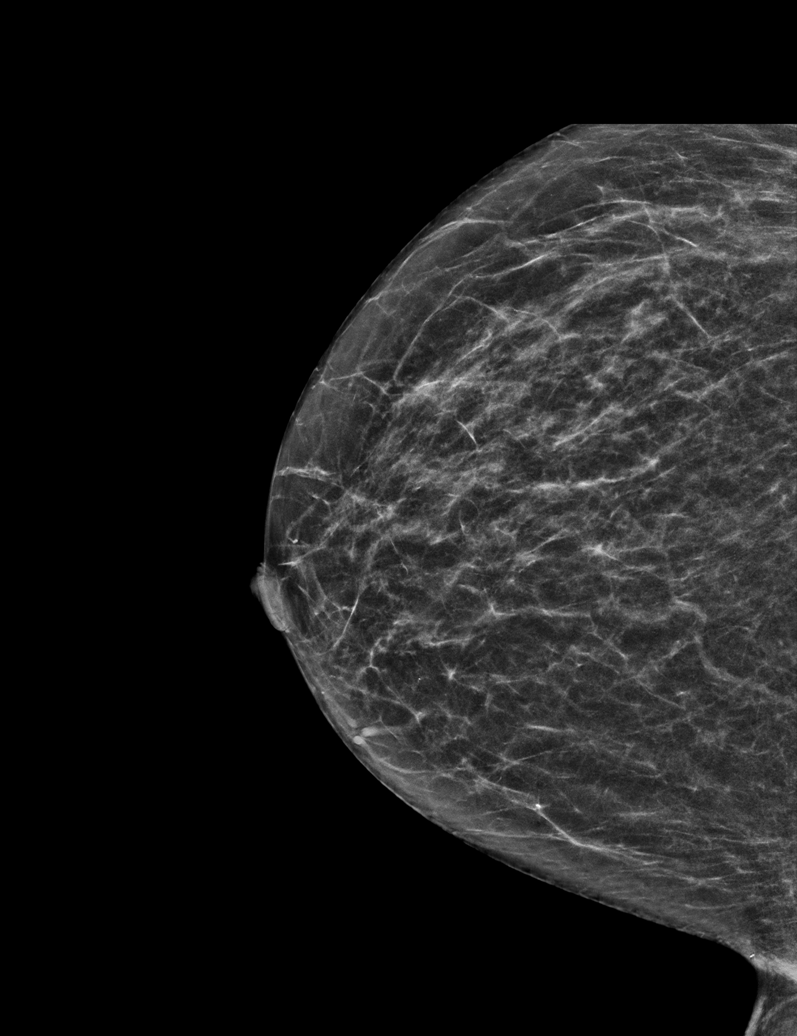

[R MLO synth-2D (1 of 2)]
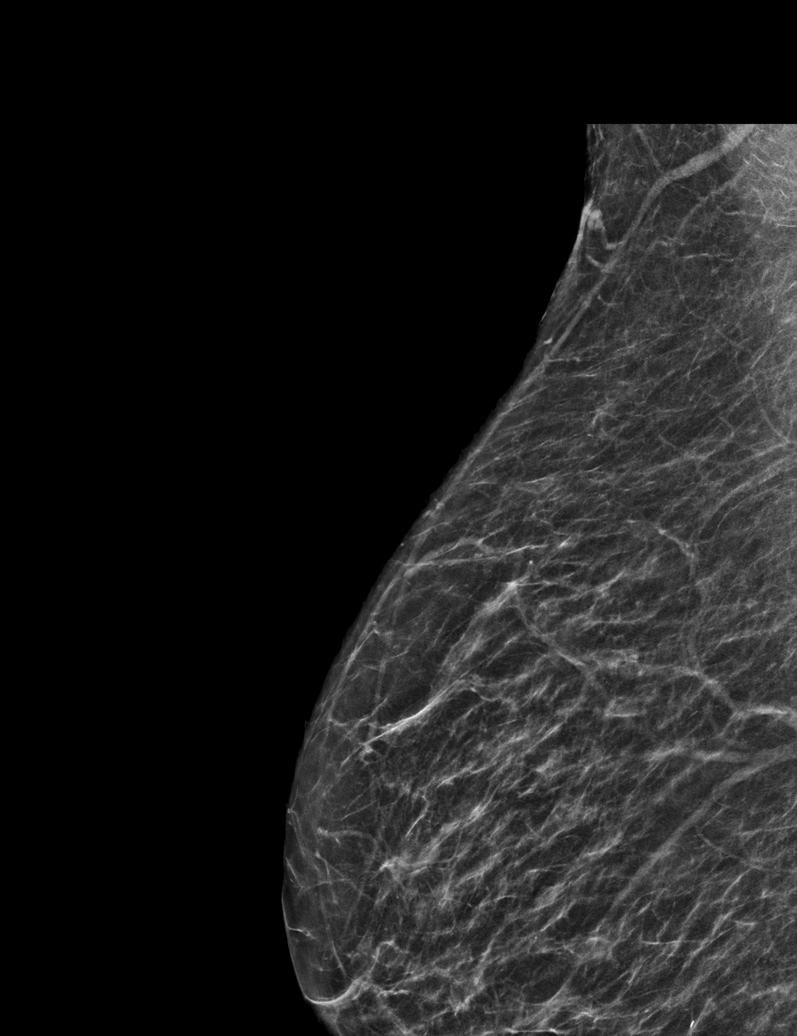

[L CC synth-2D]
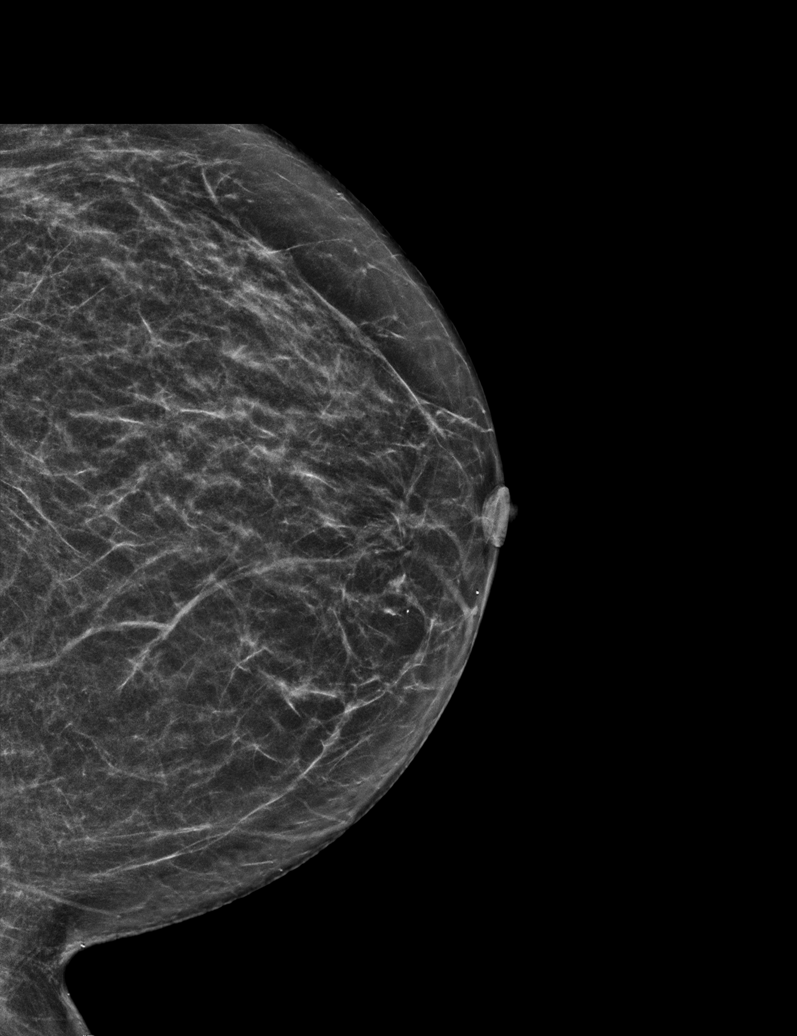

[R MLO synth-2D (2 of 2)]
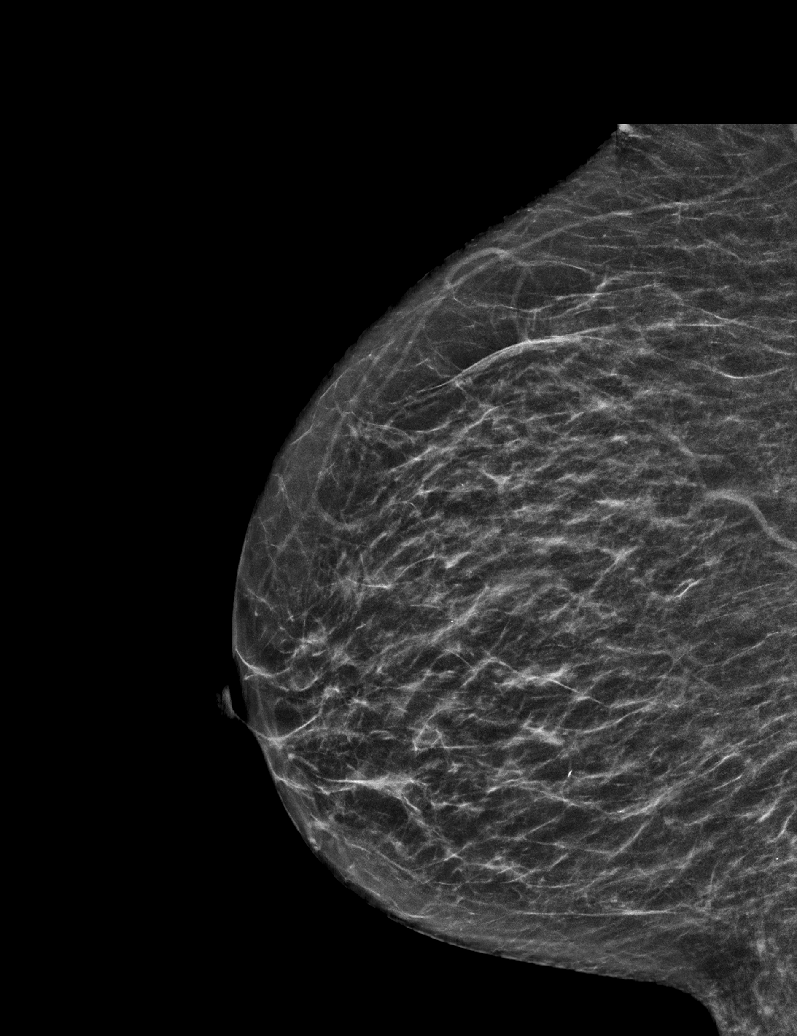

[L MLO synth-2D (1 of 2)]
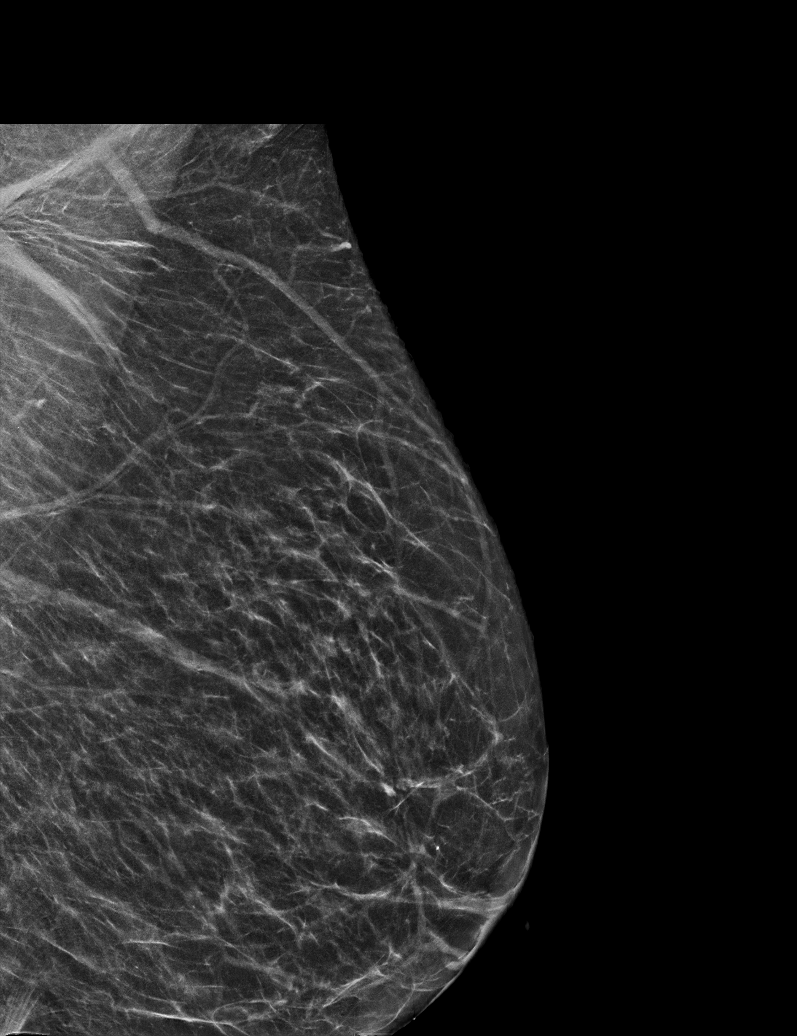

[L MLO synth-2D (2 of 2)]
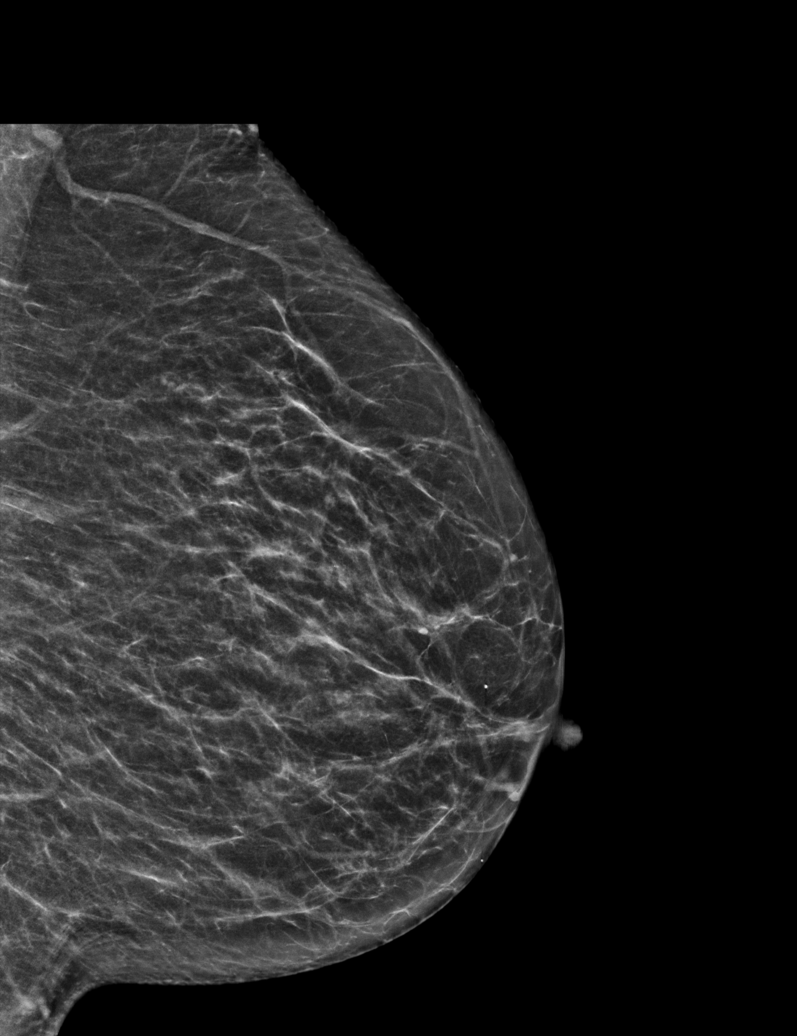

[6 of 36 positions shown; findings below may reference images not displayed]

ACR Breast Density Category b: There are scattered areas of
fibroglandular density.
FINDINGS: There are no findings suspicious for malignancy.
IMPRESSION: No mammographic evidence of malignancy. A result letter of this
screening mammogram will be mailed directly to the patient.

RECOMMENDATION:
Screening mammogram in one year. (Code:51-O-LD2)

BI-RADS CATEGORY  1: Negative.

## 2022-07-31 MED ORDER — MIRTAZAPINE 30 MG TABLET
ORAL_TABLET | Freq: Every evening | ORAL | 2 refills | 30 days
Start: 2022-07-31 — End: ?

## 2022-08-27 MED ORDER — CARBIDOPA ER 50 MG-LEVODOPA 200 MG TABLET,EXTENDED RELEASE
ORAL_TABLET | Freq: Every evening | ORAL | 3 refills | 90 days
Start: 2022-08-27 — End: ?

## 2022-08-30 MED ORDER — CARBIDOPA ER 50 MG-LEVODOPA 200 MG TABLET,EXTENDED RELEASE
ORAL_TABLET | Freq: Every evening | ORAL | 3 refills | 90 days
Start: 2022-08-30 — End: ?

## 2022-09-03 MED ORDER — CARBIDOPA ER 50 MG-LEVODOPA 200 MG TABLET,EXTENDED RELEASE
ORAL_TABLET | Freq: Every evening | ORAL | 3 refills | 90 days | Status: CP
Start: 2022-09-03 — End: ?

## 2022-10-09 ENCOUNTER — Ambulatory Visit: Admit: 2022-10-09 | Discharge: 2022-10-10 | Payer: MEDICARE | Attending: Family | Primary: Family

## 2022-10-15 MED ORDER — CARBIDOPA 25 MG-LEVODOPA 100 MG TABLET
ORAL_TABLET | 3 refills | 0 days
Start: 2022-10-15 — End: ?

## 2022-10-15 MED ORDER — MIRTAZAPINE 30 MG TABLET
ORAL_TABLET | Freq: Every evening | ORAL | 2 refills | 0 days
Start: 2022-10-15 — End: ?

## 2022-10-16 MED ORDER — MIRTAZAPINE 30 MG TABLET
ORAL_TABLET | Freq: Every evening | ORAL | 2 refills | 30 days | Status: CP
Start: 2022-10-16 — End: ?

## 2022-10-16 MED ORDER — CARBIDOPA 25 MG-LEVODOPA 100 MG TABLET
3 refills | 0 days | Status: CP
Start: 2022-10-16 — End: ?

## 2022-12-08 DIAGNOSIS — D45 Polycythemia vera: Principal | ICD-10-CM

## 2022-12-08 MED ORDER — HYDROXYUREA 500 MG CAPSULE
ORAL_CAPSULE | Freq: Every day | ORAL | 6 refills | 30 days
Start: 2022-12-08 — End: ?

## 2022-12-10 MED ORDER — HYDROXYUREA 500 MG CAPSULE
ORAL | 6 refills | 30 days | Status: CP
Start: 2022-12-10 — End: ?

## 2022-12-21 DIAGNOSIS — D45 Polycythemia vera: Principal | ICD-10-CM

## 2022-12-24 ENCOUNTER — Ambulatory Visit: Admit: 2022-12-24 | Discharge: 2022-12-24 | Payer: MEDICARE

## 2022-12-24 ENCOUNTER — Ambulatory Visit: Admit: 2022-12-24 | Discharge: 2022-12-24 | Payer: MEDICARE | Attending: Adult Health | Primary: Adult Health

## 2022-12-24 DIAGNOSIS — B370 Candidal stomatitis: Principal | ICD-10-CM

## 2022-12-24 DIAGNOSIS — D45 Polycythemia vera: Principal | ICD-10-CM

## 2022-12-24 MED ORDER — CLOTRIMAZOLE 10 MG TROCHE
Freq: Four times a day (QID) | ORAL | 0 refills | 0 days | Status: CP
Start: 2022-12-24 — End: ?

## 2023-01-09 ENCOUNTER — Ambulatory Visit
Admit: 2023-01-09 | Discharge: 2023-01-10 | Payer: MEDICARE | Attending: Clinical Neuropsychologist | Primary: Clinical Neuropsychologist

## 2023-01-09 DIAGNOSIS — G20A1 Parkinson's disease, unspecified whether dyskinesia present, unspecified whether manifestations fluctuate (CMS-HCC): Principal | ICD-10-CM

## 2023-01-09 DIAGNOSIS — G319 Degenerative disease of nervous system, unspecified: Principal | ICD-10-CM

## 2023-01-31 MED ORDER — MIRTAZAPINE 30 MG TABLET
ORAL_TABLET | Freq: Every evening | ORAL | 2 refills | 30 days
Start: 2023-01-31 — End: ?

## 2023-02-05 MED ORDER — MIRTAZAPINE 30 MG TABLET
ORAL_TABLET | Freq: Every evening | ORAL | 2 refills | 30 days | Status: CP
Start: 2023-02-05 — End: ?

## 2023-03-14 ENCOUNTER — Ambulatory Visit: Admit: 2023-03-14 | Discharge: 2023-03-15 | Payer: MEDICARE | Attending: Neurology | Primary: Neurology

## 2023-03-14 DIAGNOSIS — D45 Polycythemia vera: Principal | ICD-10-CM

## 2023-03-14 MED ORDER — RIVASTIGMINE 1.5 MG CAPSULE
ORAL_CAPSULE | ORAL | 2 refills | 0.00 days | Status: CP
Start: 2023-03-14 — End: ?

## 2023-03-15 ENCOUNTER — Ambulatory Visit: Admit: 2023-03-15 | Discharge: 2023-03-16 | Payer: MEDICARE

## 2023-03-15 ENCOUNTER — Ambulatory Visit: Admit: 2023-03-15 | Discharge: 2023-03-16 | Payer: MEDICARE | Attending: Adult Health | Primary: Adult Health

## 2023-06-07 MED ORDER — QUETIAPINE 25 MG TABLET
ORAL_TABLET | 2 refills | 0.00 days | Status: CP
Start: 2023-06-07 — End: ?

## 2023-06-12 DIAGNOSIS — D45 Polycythemia vera: Principal | ICD-10-CM

## 2023-06-13 ENCOUNTER — Ambulatory Visit: Admit: 2023-06-13 | Discharge: 2023-06-14 | Payer: MEDICARE

## 2023-06-13 ENCOUNTER — Ambulatory Visit: Admit: 2023-06-13 | Discharge: 2023-06-14 | Payer: MEDICARE | Attending: Adult Health | Primary: Adult Health

## 2023-06-17 MED ORDER — RIVASTIGMINE 1.5 MG CAPSULE
ORAL_CAPSULE | ORAL | 2 refills | 0 days
Start: 2023-06-17 — End: ?

## 2023-06-17 MED ORDER — MIRTAZAPINE 30 MG TABLET
ORAL_TABLET | Freq: Every evening | ORAL | 2 refills | 30 days
Start: 2023-06-17 — End: ?

## 2023-06-18 MED ORDER — MIRTAZAPINE 30 MG TABLET
ORAL_TABLET | Freq: Every evening | ORAL | 2 refills | 30 days | Status: CP
Start: 2023-06-18 — End: ?

## 2023-06-18 MED ORDER — RIVASTIGMINE 1.5 MG CAPSULE
ORAL_CAPSULE | ORAL | 2 refills | 0 days | Status: CP
Start: 2023-06-18 — End: ?

## 2023-08-20 MED ORDER — CARBIDOPA ER 50 MG-LEVODOPA 200 MG TABLET,EXTENDED RELEASE
ORAL_TABLET | Freq: Every evening | ORAL | 3 refills | 90.00000 days | Status: CP
Start: 2023-08-20 — End: ?

## 2023-08-20 MED ORDER — RIVASTIGMINE 3 MG CAPSULE
Freq: Two times a day (BID) | ORAL | 0 refills | 0.00000 days
Start: 2023-08-20 — End: ?

## 2023-08-22 MED ORDER — RIVASTIGMINE 3 MG CAPSULE
ORAL_CAPSULE | Freq: Two times a day (BID) | ORAL | 3 refills | 90.00000 days | Status: CP
Start: 2023-08-22 — End: ?

## 2023-08-28 DIAGNOSIS — D45 Polycythemia vera: Principal | ICD-10-CM

## 2023-08-29 ENCOUNTER — Ambulatory Visit: Admit: 2023-08-29 | Discharge: 2023-08-29 | Payer: MEDICARE

## 2023-08-29 ENCOUNTER — Ambulatory Visit: Admit: 2023-08-29 | Discharge: 2023-08-29 | Payer: MEDICARE | Attending: Adult Health | Primary: Adult Health

## 2023-08-29 DIAGNOSIS — D45 Polycythemia vera: Principal | ICD-10-CM

## 2023-09-02 MED ORDER — QUETIAPINE 25 MG TABLET
ORAL_TABLET | 2 refills | 0.00000 days
Start: 2023-09-02 — End: ?

## 2023-09-05 MED ORDER — QUETIAPINE 25 MG TABLET
ORAL_TABLET | ORAL | 2 refills | 0.00000 days | Status: CP
Start: 2023-09-05 — End: ?

## 2023-09-16 MED ORDER — MIRTAZAPINE 30 MG TABLET
ORAL_TABLET | Freq: Every evening | ORAL | 2 refills | 0.00000 days
Start: 2023-09-16 — End: ?

## 2023-09-17 MED ORDER — MIRTAZAPINE 30 MG TABLET
ORAL_TABLET | Freq: Every evening | ORAL | 2 refills | 30.00000 days | Status: CP
Start: 2023-09-17 — End: ?

## 2023-10-14 MED ORDER — CARBIDOPA 25 MG-LEVODOPA 100 MG TABLET
ORAL_TABLET | ORAL | 3 refills | 0.00000 days | Status: CP
Start: 2023-10-14 — End: ?

## 2023-12-09 DIAGNOSIS — D45 Polycythemia vera: Principal | ICD-10-CM

## 2023-12-09 MED ORDER — HYDROXYUREA 500 MG CAPSULE
ORAL_CAPSULE | Freq: Every day | ORAL | 0.00000 days
Start: 2023-12-09 — End: ?

## 2023-12-10 DIAGNOSIS — D45 Polycythemia vera: Principal | ICD-10-CM

## 2023-12-10 MED ORDER — HYDROXYUREA 500 MG CAPSULE
ORAL_CAPSULE | Freq: Every day | ORAL | 11 refills | 20.00000 days | Status: CP
Start: 2023-12-10 — End: 2024-11-10

## 2023-12-11 DIAGNOSIS — D45 Polycythemia vera: Principal | ICD-10-CM

## 2023-12-12 ENCOUNTER — Ambulatory Visit: Admit: 2023-12-12 | Discharge: 2023-12-13 | Payer: MEDICARE | Attending: Adult Health | Primary: Adult Health

## 2023-12-12 ENCOUNTER — Ambulatory Visit: Admit: 2023-12-12 | Discharge: 2023-12-13 | Payer: MEDICARE

## 2023-12-12 DIAGNOSIS — D45 Polycythemia vera: Principal | ICD-10-CM

## 2023-12-12 MED ORDER — HYDROXYUREA 500 MG CAPSULE
ORAL_CAPSULE | Freq: Every day | ORAL | 11 refills | 0.00000 days | Status: CP
Start: 2023-12-12 — End: ?

## 2023-12-19 ENCOUNTER — Ambulatory Visit: Admit: 2023-12-19 | Discharge: 2023-12-20 | Payer: MEDICARE | Attending: Family | Primary: Family

## 2023-12-19 DIAGNOSIS — R1310 Dysphagia, unspecified: Principal | ICD-10-CM

## 2023-12-19 DIAGNOSIS — G20A1 Dementia due to Parkinson's disease without behavioral disturbance    (CMS-HCC): Principal | ICD-10-CM

## 2023-12-19 DIAGNOSIS — F0280 Dementia in other diseases classified elsewhere without behavioral disturbance: Principal | ICD-10-CM

## 2024-03-02 MED ORDER — QUETIAPINE 25 MG TABLET
ORAL_TABLET | Freq: Every evening | ORAL | 2 refills | 90.00000 days | Status: CP
Start: 2024-03-02 — End: ?

## 2024-04-01 DIAGNOSIS — D45 Polycythemia vera: Principal | ICD-10-CM

## 2024-04-02 ENCOUNTER — Ambulatory Visit: Admit: 2024-04-02 | Discharge: 2024-04-02 | Payer: MEDICARE | Attending: Adult Health | Primary: Adult Health

## 2024-04-02 ENCOUNTER — Ambulatory Visit: Admit: 2024-04-02 | Discharge: 2024-04-02 | Payer: MEDICARE

## 2024-04-02 DIAGNOSIS — D45 Polycythemia vera: Principal | ICD-10-CM
